# Patient Record
Sex: Female | Born: 1990 | Race: Black or African American | Hispanic: No | Marital: Married | Smoking: Current every day smoker
Health system: Southern US, Community
[De-identification: ages and names within clinical notes are randomized; demographics above are authoritative.]

## PROBLEM LIST (undated history)

## (undated) ENCOUNTER — Inpatient Hospital Stay: Payer: Self-pay

## (undated) DIAGNOSIS — G894 Chronic pain syndrome: Secondary | ICD-10-CM

## (undated) DIAGNOSIS — R51 Headache: Secondary | ICD-10-CM

## (undated) DIAGNOSIS — K219 Gastro-esophageal reflux disease without esophagitis: Secondary | ICD-10-CM

## (undated) DIAGNOSIS — N644 Mastodynia: Secondary | ICD-10-CM

## (undated) DIAGNOSIS — F419 Anxiety disorder, unspecified: Secondary | ICD-10-CM

## (undated) DIAGNOSIS — M19079 Primary osteoarthritis, unspecified ankle and foot: Secondary | ICD-10-CM

## (undated) DIAGNOSIS — F32A Depression, unspecified: Secondary | ICD-10-CM

## (undated) DIAGNOSIS — G4733 Obstructive sleep apnea (adult) (pediatric): Secondary | ICD-10-CM

## (undated) DIAGNOSIS — R519 Headache, unspecified: Secondary | ICD-10-CM

## (undated) DIAGNOSIS — F329 Major depressive disorder, single episode, unspecified: Secondary | ICD-10-CM

## (undated) DIAGNOSIS — N63 Unspecified lump in unspecified breast: Secondary | ICD-10-CM

## (undated) DIAGNOSIS — E669 Obesity, unspecified: Secondary | ICD-10-CM

## (undated) HISTORY — DX: Obesity, unspecified: E66.9

## (undated) HISTORY — DX: Chronic pain syndrome: G89.4

## (undated) HISTORY — DX: Obstructive sleep apnea (adult) (pediatric): G47.33

## (undated) HISTORY — DX: Primary osteoarthritis, unspecified ankle and foot: M19.079

---
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
## 2008-12-14 ENCOUNTER — Emergency Department: Payer: Self-pay | Admitting: Emergency Medicine

## 2009-01-22 ENCOUNTER — Emergency Department: Payer: Self-pay | Admitting: Internal Medicine

## 2009-03-13 ENCOUNTER — Emergency Department: Payer: Self-pay | Admitting: Emergency Medicine

## 2009-08-28 ENCOUNTER — Emergency Department: Payer: Self-pay | Admitting: Emergency Medicine

## 2009-09-26 ENCOUNTER — Emergency Department: Payer: Self-pay | Admitting: Unknown Physician Specialty

## 2009-09-28 ENCOUNTER — Emergency Department: Payer: Self-pay | Admitting: Emergency Medicine

## 2009-12-14 ENCOUNTER — Emergency Department: Payer: Self-pay | Admitting: Emergency Medicine

## 2010-04-05 ENCOUNTER — Encounter: Payer: Self-pay | Admitting: Family Medicine

## 2010-04-21 ENCOUNTER — Observation Stay: Payer: Self-pay

## 2010-04-25 ENCOUNTER — Encounter: Payer: Self-pay | Admitting: Family Medicine

## 2010-05-28 ENCOUNTER — Observation Stay: Payer: Self-pay | Admitting: Obstetrics and Gynecology

## 2010-06-19 ENCOUNTER — Encounter: Payer: Self-pay | Admitting: Family Medicine

## 2010-06-24 ENCOUNTER — Encounter: Payer: Self-pay | Admitting: Family Medicine

## 2010-07-05 ENCOUNTER — Observation Stay: Payer: Self-pay | Admitting: Obstetrics and Gynecology

## 2010-07-09 ENCOUNTER — Observation Stay: Payer: Self-pay | Admitting: Obstetrics and Gynecology

## 2010-07-29 ENCOUNTER — Observation Stay: Payer: Self-pay | Admitting: Obstetrics and Gynecology

## 2010-08-05 ENCOUNTER — Observation Stay: Payer: Self-pay | Admitting: Obstetrics and Gynecology

## 2010-08-12 ENCOUNTER — Observation Stay: Payer: Self-pay | Admitting: Obstetrics and Gynecology

## 2010-08-14 ENCOUNTER — Inpatient Hospital Stay: Payer: Self-pay | Admitting: Obstetrics and Gynecology

## 2010-10-24 ENCOUNTER — Emergency Department: Payer: Self-pay | Admitting: Emergency Medicine

## 2010-11-17 ENCOUNTER — Emergency Department: Payer: Self-pay | Admitting: Emergency Medicine

## 2011-03-11 ENCOUNTER — Emergency Department: Payer: Self-pay | Admitting: Emergency Medicine

## 2011-04-03 ENCOUNTER — Emergency Department: Payer: Self-pay | Admitting: Emergency Medicine

## 2011-04-08 ENCOUNTER — Emergency Department: Payer: Self-pay | Admitting: Emergency Medicine

## 2011-05-25 ENCOUNTER — Emergency Department: Payer: Self-pay | Admitting: Emergency Medicine

## 2011-11-05 ENCOUNTER — Emergency Department: Payer: Self-pay | Admitting: Emergency Medicine

## 2011-12-20 ENCOUNTER — Emergency Department: Payer: Self-pay | Admitting: Internal Medicine

## 2012-01-24 ENCOUNTER — Emergency Department: Payer: Self-pay | Admitting: Emergency Medicine

## 2012-01-24 LAB — RAPID INFLUENZA A&B ANTIGENS

## 2012-03-26 ENCOUNTER — Emergency Department: Payer: Self-pay | Admitting: Emergency Medicine

## 2012-04-20 ENCOUNTER — Emergency Department: Payer: Self-pay | Admitting: Emergency Medicine

## 2012-04-28 ENCOUNTER — Emergency Department: Payer: Self-pay | Admitting: Emergency Medicine

## 2012-06-11 ENCOUNTER — Encounter (HOSPITAL_COMMUNITY): Payer: Self-pay | Admitting: Emergency Medicine

## 2012-06-11 ENCOUNTER — Emergency Department (HOSPITAL_COMMUNITY)
Admission: EM | Admit: 2012-06-11 | Discharge: 2012-06-11 | Disposition: A | Discharge status: Home / Self Care | Payer: Self-pay | Attending: Emergency Medicine | Admitting: Emergency Medicine

## 2012-06-11 DIAGNOSIS — K0889 Other specified disorders of teeth and supporting structures: Secondary | ICD-10-CM

## 2012-06-11 DIAGNOSIS — K029 Dental caries, unspecified: Secondary | ICD-10-CM

## 2012-06-11 DIAGNOSIS — F17200 Nicotine dependence, unspecified, uncomplicated: Secondary | ICD-10-CM | POA: Insufficient documentation

## 2012-06-11 DIAGNOSIS — K089 Disorder of teeth and supporting structures, unspecified: Secondary | ICD-10-CM | POA: Insufficient documentation

## 2012-06-11 MED ORDER — HYDROCODONE-ACETAMINOPHEN 5-325 MG PO TABS: 1.0000 | ORAL_TABLET | ORAL | Status: DC | PRN

## 2012-06-11 MED ORDER — CLINDAMYCIN HCL 150 MG PO CAPS: 300.0000 mg | ORAL_CAPSULE | Freq: Four times a day (QID) | ORAL | Status: DC

## 2012-06-11 MED ORDER — HYDROCODONE-ACETAMINOPHEN 5-325 MG PO TABS
1.0000 | ORAL_TABLET | Freq: Once | ORAL | Status: AC
  Administered 2012-06-11: 1 via ORAL
  Filled 2012-06-11: qty 1

## 2012-06-11 NOTE — ED Notes (Signed)
Pt alert and mentating appropriately upon d/c teaching; pt given d./c teaching, prescriptions and follow up care; pt verbalizes understanding of d/c teaching and has no fruther questions upon d/c. NAD noted upon d/c. Pt instructed not to drive and endorses that she will not drive and has ride home.

## 2012-06-11 NOTE — ED Notes (Signed)
Pt reports dental cavities and pain for the last several months to lower molars. States has been  seen at the dental clinic but self pay and unable to afford extraction. States unable to eat foods normally due to pain. Denies fevers.

## 2012-06-11 NOTE — ED Provider Notes (Signed)
Medical screening examination/treatment/procedure(s) were performed by non-physician practitioner and as supervising physician I was immediately available for consultation/collaboration.  Doug Sou, MD 06/11/12 6295

## 2012-06-11 NOTE — ED Provider Notes (Signed)
History     CSN: [id number]  Arrival date & time 06/11/12  1140   First MD Initiated Contact with Patient 06/11/12 1401      Chief Complaint  Patient presents with  . Dental Pain    (Consider location/radiation/quality/duration/timing/severity/associated sxs/prior treatment) HPI Comments: Patient reports she has had pain in her bilateral lower molars x months.  States she has been to a dental clinic where she was put on Keflex and pain medication, but they were unable to pull her teeth for some reason, was told she needed to see another dentist.  States she was told to take the keflex "as needed," and this has helped "a little bit." Ran out of her pain medication 2-3 days ago.  Denies fevers, sore throat, difficulty swallowing or breathing.   The history is provided by the patient.    History reviewed. No pertinent past medical history.  History reviewed. No pertinent past surgical history.  History reviewed. No pertinent family history.  History  Substance Use Topics  . Smoking status: Current Every Day Smoker  . Smokeless tobacco: Not on file  . Alcohol Use: No    OB History   Grav Para Term Preterm Abortions TAB SAB Ect Mult Living                  Review of Systems  Constitutional: Negative for fever and chills.  HENT: Positive for dental problem. Negative for sore throat, facial swelling and trouble swallowing.   Respiratory: Negative for shortness of breath.     Allergies  Haldol and Amoxicillin  Home Medications   Current Outpatient Rx  Name  Route  Sig  Dispense  Refill  . ciprofloxacin (CIPRO) 500 MG tablet   Oral   Take 500 mg by mouth 2 (two) times daily.         Marland Kitchen oxyCODONE-acetaminophen (PERCOCET/ROXICET) 5-325 MG per tablet   Oral   Take 1 tablet by mouth every 4 (four) hours as needed for pain.         . clindamycin (CLEOCIN) 150 MG capsule   Oral   Take 2 capsules (300 mg total) by mouth 4 (four) times daily.   80 capsule   0   .  HYDROcodone-acetaminophen (NORCO/VICODIN) 5-325 MG per tablet   Oral   Take 1 tablet by mouth every 4 (four) hours as needed for pain.   15 tablet   0     BP 107/52  Pulse 56  Temp(Src) 98.7 F (37.1 C) (Oral)  Ht 5\' 1"  (1.549 m)  Wt 165 lb (74.844 kg)  BMI 31.19 kg/m2  SpO2 99%  LMP 05/21/2012  Physical Exam  Nursing note and vitals reviewed. Constitutional: She appears well-developed and well-nourished. No distress.  HENT:  Head: Normocephalic and atraumatic.  Mouth/Throat: Uvula is midline and mucous membranes are normal. Mucous membranes are not dry. Abnormal dentition. Dental caries present. No edematous. No oropharyngeal exudate, posterior oropharyngeal edema, posterior oropharyngeal erythema or tonsillar abscesses.    Neck: Normal range of motion and phonation normal. Neck supple. No tracheal tenderness present. No tracheal deviation present.  Pulmonary/Chest: Effort normal. No stridor.  Lymphadenopathy:    She has cervical adenopathy.  Neurological: She is alert.  Skin: She is not diaphoretic.    ED Course  Procedures (including critical care time)  Labs Reviewed - No data to display No results found.   1. Pain, dental   2. Caries     MDM  Pt with caries, bilateral  lower molars.  Anterior cervical lymphadenopathy R>L. No paratracheal tenderness or voice change.  No fever.  No obvious dental abscess.  Doubt deeper infection such as ludwig's angina.  Discussed proper use of antibiotics.  Given prior infection that is likely partially treated, will give antibiotic for possible abscess.  D/C home with clinda (pen allergic), norco, dental follow up.  Discussed diagnosis, treatment plan, and follow up with patient.  Pt given return precautions.  Pt verbalizes understanding and agrees with plan.         Trixie Dredge, PA-C 06/11/12 1533

## 2012-06-11 NOTE — ED Notes (Signed)
Pt c/o dental pain x past few months; pt denies fevers/chills; pt denies numbness and tingling; pt alert and mentating appropriately; pt states she vomited once this morning

## 2012-06-11 NOTE — ED Notes (Signed)
Trixie Dredge, PA at beside

## 2012-06-15 ENCOUNTER — Telehealth (HOSPITAL_COMMUNITY): Payer: Self-pay | Admitting: *Deleted

## 2012-06-17 ENCOUNTER — Telehealth (HOSPITAL_COMMUNITY): Payer: Self-pay | Admitting: Emergency Medicine

## 2012-06-17 NOTE — ED Notes (Signed)
Call from on call dentist requesting demographics and referral be faxed to their office. 

## 2012-08-13 ENCOUNTER — Emergency Department: Payer: Self-pay | Admitting: Unknown Physician Specialty

## 2012-08-13 LAB — COMPREHENSIVE METABOLIC PANEL
Alkaline Phosphatase: 56 U/L (ref 50–136)
Anion Gap: 7 (ref 7–16)
BUN: 9 mg/dL (ref 7–18)
Chloride: 104 mmol/L (ref 98–107)
Co2: 25 mmol/L (ref 21–32)
EGFR (African American): >60
Glucose: 87 mg/dL (ref 65–99)
Potassium: 3.8 mmol/L (ref 3.5–5.1)
SGOT(AST): 24 U/L (ref 15–37)
Sodium: 136 mmol/L (ref 136–145)
Total Protein: 7.5 g/dL (ref 6.4–8.2)

## 2012-08-13 LAB — URINALYSIS, COMPLETE
RBC,UR: 2 /HPF (ref 0–5)
WBC UR: 6 /HPF (ref 0–5)

## 2012-08-13 LAB — CBC
HGB: 11.8 g/dL — ABNORMAL LOW (ref 12.0–16.0)
RBC: 4.57 10*6/uL (ref 3.80–5.20)

## 2012-08-21 ENCOUNTER — Emergency Department: Payer: Self-pay | Admitting: Emergency Medicine

## 2012-08-21 LAB — URINALYSIS, COMPLETE
Blood: NEGATIVE
Ketone: NEGATIVE
Ph: 6 (ref 4.5–8.0)
Protein: NEGATIVE
Specific Gravity: 1.023 (ref 1.003–1.030)
Squamous Epithelial: 1
WBC UR: 1 /HPF (ref 0–5)

## 2012-08-21 LAB — CBC
HCT: 35.6 % (ref 35.0–47.0)
MCHC: 32.2 g/dL (ref 32.0–36.0)
MCV: 80 fL (ref 80–100)
Platelet: 373 10*3/uL (ref 150–440)
RBC: 4.46 10*6/uL (ref 3.80–5.20)
WBC: 7.4 10*3/uL (ref 3.6–11.0)

## 2012-11-03 ENCOUNTER — Encounter: Payer: Self-pay | Admitting: Obstetrics & Gynecology

## 2012-11-23 ENCOUNTER — Encounter: Payer: Self-pay | Admitting: Obstetrics & Gynecology

## 2013-03-03 ENCOUNTER — Observation Stay: Payer: Self-pay

## 2013-03-12 DIAGNOSIS — Z8659 Personal history of other mental and behavioral disorders: Secondary | ICD-10-CM | POA: Insufficient documentation

## 2013-03-12 DIAGNOSIS — F1911 Other psychoactive substance abuse, in remission: Secondary | ICD-10-CM | POA: Insufficient documentation

## 2013-06-07 ENCOUNTER — Emergency Department: Payer: Self-pay | Admitting: Emergency Medicine

## 2013-06-07 LAB — DRUG SCREEN, URINE
AMPHETAMINES, UR SCREEN: NEGATIVE (ref ?–1000)
BARBITURATES, UR SCREEN: NEGATIVE (ref ?–200)
Benzodiazepine, Ur Scrn: NEGATIVE (ref ?–200)
CANNABINOID 50 NG, UR ~~LOC~~: NEGATIVE (ref ?–50)
COCAINE METABOLITE, UR ~~LOC~~: NEGATIVE (ref ?–300)
MDMA (Ecstasy)Ur Screen: NEGATIVE (ref ?–500)
METHADONE, UR SCREEN: NEGATIVE (ref ?–300)
OPIATE, UR SCREEN: POSITIVE (ref ?–300)
PHENCYCLIDINE (PCP) UR S: NEGATIVE (ref ?–25)
TRICYCLIC, UR SCREEN: NEGATIVE (ref ?–1000)

## 2013-06-07 LAB — CBC WITH DIFFERENTIAL/PLATELET
BASOS ABS: 0 10*3/uL (ref 0.0–0.1)
Basophil %: 0.5 %
EOS PCT: 1.8 %
Eosinophil #: 0.1 10*3/uL (ref 0.0–0.7)
HCT: 37.9 % (ref 35.0–47.0)
HGB: 11.9 g/dL — AB (ref 12.0–16.0)
LYMPHS PCT: 29.2 %
Lymphocyte #: 1.6 10*3/uL (ref 1.0–3.6)
MCH: 24.8 pg — ABNORMAL LOW (ref 26.0–34.0)
MCHC: 31.4 g/dL — ABNORMAL LOW (ref 32.0–36.0)
MCV: 79 fL — ABNORMAL LOW (ref 80–100)
Monocyte #: 0.5 x10 3/mm (ref 0.2–0.9)
Monocyte %: 9 %
NEUTROS PCT: 59.5 %
Neutrophil #: 3.2 10*3/uL (ref 1.4–6.5)
Platelet: 351 10*3/uL (ref 150–440)
RBC: 4.79 10*6/uL (ref 3.80–5.20)
RDW: 18.5 % — AB (ref 11.5–14.5)
WBC: 5.4 10*3/uL (ref 3.6–11.0)

## 2013-06-07 LAB — COMPREHENSIVE METABOLIC PANEL
ALK PHOS: 68 U/L
ALT: 49 U/L (ref 12–78)
ANION GAP: 3 — AB (ref 7–16)
Albumin: 3.9 g/dL (ref 3.4–5.0)
BILIRUBIN TOTAL: 0.3 mg/dL (ref 0.2–1.0)
BUN: 13 mg/dL (ref 7–18)
CALCIUM: 9 mg/dL (ref 8.5–10.1)
Chloride: 105 mmol/L (ref 98–107)
Co2: 29 mmol/L (ref 21–32)
Creatinine: 0.85 mg/dL (ref 0.60–1.30)
EGFR (Non-African Amer.): >60
Glucose: 91 mg/dL (ref 65–99)
OSMOLALITY: 274 (ref 275–301)
POTASSIUM: 3.8 mmol/L (ref 3.5–5.1)
SGOT(AST): 28 U/L (ref 15–37)
Sodium: 137 mmol/L (ref 136–145)
Total Protein: 7.4 g/dL (ref 6.4–8.2)

## 2013-06-07 LAB — URINALYSIS, COMPLETE
BILIRUBIN, UR: NEGATIVE
BLOOD: NEGATIVE
GLUCOSE, UR: NEGATIVE mg/dL (ref 0–75)
KETONE: NEGATIVE
Nitrite: NEGATIVE
Ph: 5 (ref 4.5–8.0)
Protein: 30
RBC,UR: 8 /HPF (ref 0–5)
SPECIFIC GRAVITY: 1.034 (ref 1.003–1.030)
WBC UR: 11 /HPF (ref 0–5)

## 2013-06-07 LAB — GC/CHLAMYDIA PROBE AMP

## 2013-06-07 LAB — WET PREP, GENITAL

## 2013-06-15 DIAGNOSIS — Z3040 Encounter for surveillance of contraceptives, unspecified: Secondary | ICD-10-CM | POA: Insufficient documentation

## 2013-08-24 ENCOUNTER — Emergency Department: Payer: Self-pay | Admitting: Emergency Medicine

## 2013-08-24 LAB — BASIC METABOLIC PANEL
Anion Gap: 8 (ref 7–16)
BUN: 15 mg/dL (ref 7–18)
CALCIUM: 9.3 mg/dL (ref 8.5–10.1)
CO2: 25 mmol/L (ref 21–32)
Chloride: 106 mmol/L (ref 98–107)
Creatinine: 0.9 mg/dL (ref 0.60–1.30)
EGFR (African American): >60
Glucose: 100 mg/dL — ABNORMAL HIGH (ref 65–99)
Osmolality: 278 (ref 275–301)
Potassium: 4 mmol/L (ref 3.5–5.1)
SODIUM: 139 mmol/L (ref 136–145)

## 2013-08-24 LAB — CBC
HCT: 38.4 % (ref 35.0–47.0)
HGB: 12.1 g/dL (ref 12.0–16.0)
MCH: 25.4 pg — AB (ref 26.0–34.0)
MCHC: 31.6 g/dL — ABNORMAL LOW (ref 32.0–36.0)
MCV: 80 fL (ref 80–100)
PLATELETS: 378 10*3/uL (ref 150–440)
RBC: 4.78 10*6/uL (ref 3.80–5.20)
RDW: 15.1 % — AB (ref 11.5–14.5)
WBC: 5.8 10*3/uL (ref 3.6–11.0)

## 2013-08-24 LAB — TROPONIN I

## 2014-08-02 ENCOUNTER — Emergency Department
Admission: EM | Admit: 2014-08-02 | Discharge: 2014-08-02 | Disposition: A | Discharge status: Home / Self Care | Payer: Medicaid Other | Attending: Emergency Medicine | Admitting: Emergency Medicine

## 2014-08-02 ENCOUNTER — Encounter: Payer: Self-pay | Admitting: Emergency Medicine

## 2014-08-02 DIAGNOSIS — Z720 Tobacco use: Secondary | ICD-10-CM | POA: Diagnosis not present

## 2014-08-02 DIAGNOSIS — R2232 Localized swelling, mass and lump, left upper limb: Secondary | ICD-10-CM | POA: Diagnosis present

## 2014-08-02 DIAGNOSIS — L732 Hidradenitis suppurativa: Secondary | ICD-10-CM | POA: Diagnosis not present

## 2014-08-02 MED ORDER — CLINDAMYCIN HCL 300 MG PO CAPS: 300.0000 mg | ORAL_CAPSULE | Freq: Three times a day (TID) | ORAL | Status: DC

## 2014-08-02 MED ORDER — IBUPROFEN 800 MG PO TABS: 800.0000 mg | ORAL_TABLET | Freq: Three times a day (TID) | ORAL | Status: DC | PRN

## 2014-08-02 MED ORDER — CLINDAMYCIN PHOSPHATE 1 % EX SOLN: CUTANEOUS | Status: DC

## 2014-08-02 MED ORDER — HYDROCODONE-ACETAMINOPHEN 5-325 MG PO TABS: 1.0000 | ORAL_TABLET | Freq: Four times a day (QID) | ORAL | Status: DC | PRN

## 2014-08-02 NOTE — Discharge Instructions (Signed)
Hidradenitis Suppurativa, Sweat Gland Abscess Hidradenitis suppurativa is a long lasting (chronic), uncommon disease of the sweat glands. With this, boil-like lumps and scarring develop in the groin, some times under the arms (axillae), and under the breasts. It may also uncommonly occur behind the ears, in the crease of the buttocks, and around the genitals.  CAUSES  The cause is from a blocking of the sweat glands. They then become infected. It may cause drainage and odor. It is not contagious. So it cannot be given to someone else. It most often shows up in puberty (about 10 to 26 years of age). But it may happen much later. It is similar to acne which is a disease of the sweat glands. This condition is slightly more common in African-Americans and women. SYMPTOMS   Hidradenitis usually starts as one or more red, tender, swellings in the groin or under the arms (axilla).  Over a period of hours to days the lesions get larger. They often open to the skin surface, draining clear to yellow-colored fluid.  The infected area heals with scarring. DIAGNOSIS  Your caregiver makes this diagnosis by looking at you. Sometimes cultures (growing germs on plates in the lab) may be taken. This is to see what germ (bacterium) is causing the infection.  TREATMENT   Topical germ killing medicine applied to the skin (antibiotics) are the treatment of choice. Antibiotics taken by mouth (systemic) are sometimes needed when the condition is getting worse or is severe.  Avoid tight-fitting clothing which traps moisture in.  Dirt does not cause hidradenitis and it is not caused by poor hygiene.  Involved areas should be cleaned daily using an antibacterial soap. Some patients find that the liquid form of Lever 2000, applied to the involved areas as a lotion after bathing, can help reduce the odor related to this condition.  Sometimes surgery is needed to drain infected areas or remove scarred tissue. Removal of  large amounts of tissue is used only in severe cases.  Birth control pills may be helpful.  Oral retinoids (vitamin A derivatives) for 6 to 12 months which are effective for acne may also help this condition.  Weight loss will improve but not cure hidradenitis. It is made worse by being overweight. But the condition is not caused by being overweight.  This condition is more common in people who have had acne.  It may become worse under stress. There is no medical cure for hidradenitis. It can be controlled, but not cured. The condition usually continues for years with periods of getting worse and getting better (remission). Document Released: 10/24/2003 Document Revised: 06/03/2011 Document Reviewed: 06/11/2013 [employer] Patient Information 2015 [employer]. This information is not intended to replace advice given to you by your health care provider. Make sure you discuss any questions you have with your health care provider.  

## 2014-08-02 NOTE — ED Provider Notes (Signed)
[facility] Emergency Department Provider Note  ____________________________________________  Time seen: Approximately 1720  I have reviewed the triage vital signs and the nursing notes.   HISTORY  Chief Complaint Abscess    HPI Madison Singleton is a 24 y.o. female complaining of swelling and drainage of her left axilla states that she's had this before is here today for antibiotics and pain management rates pain as about a 6-8 out of 10 and relieved when she is not moving worsening with any touching has any fevers chills or any other complaints at this time   History reviewed. Previous history of hidradenitis  There are no active problems to display for this patient.   History reviewed. No pertinent past surgical history.  Current Outpatient Rx  Name  Route  Sig  Dispense  Refill  . ciprofloxacin (CIPRO) 500 MG tablet   Oral   Take 500 mg by mouth 2 (two) times daily.         . clindamycin (CLEOCIN) 150 MG capsule   Oral   Take 2 capsules (300 mg total) by mouth 4 (four) times daily.   80 capsule   0   . clindamycin (CLEOCIN) 300 MG capsule   Oral   Take 1 capsule (300 mg total) by mouth 3 (three) times daily.   30 capsule   0   . clindamycin (CLEOCIN-T) 1 % external solution      Apply to affected area 2 times daily   60 mL   0   . HYDROcodone-acetaminophen (NORCO) 5-325 MG per tablet   Oral   Take 1 tablet by mouth every 6 (six) hours as needed for moderate pain.   12 tablet   0   . HYDROcodone-acetaminophen (NORCO/VICODIN) 5-325 MG per tablet   Oral   Take 1 tablet by mouth every 4 (four) hours as needed for pain.   15 tablet   0   . ibuprofen (ADVIL,MOTRIN) 800 MG tablet   Oral   Take 1 tablet (800 mg total) by mouth every 8 (eight) hours as needed.   30 tablet   0   . oxyCODONE-acetaminophen (PERCOCET/ROXICET) 5-325 MG per tablet   Oral   Take 1 tablet by mouth every 4 (four) hours as needed for pain.            Allergies Haldol and Amoxicillin  No family history on file.  Social History History  Substance Use Topics  . Smoking status: Current Every Day Smoker  . Smokeless tobacco: Not on file  . Alcohol Use: No    Review of Systems Constitutional: No fever/chills Eyes: No visual changes. ENT: No sore throat. Cardiovascular: Denies chest pain. Respiratory: Denies shortness of breath. Gastrointestinal: No abdominal pain.  No nausea, no vomiting.  No diarrhea.  No constipation. Genitourinary: Negative for dysuria. Musculoskeletal: Negative for back pain. Skin: Negative for rash. Neurological: Negative for headaches, focal weakness or numbness.  6-point ROS otherwise negative.  ____________________________________________   PHYSICAL EXAM:  VITAL SIGNS: ED Triage Vitals  Enc Vitals Group     BP 08/02/14 1706 115/80 mmHg     Pulse Rate 08/02/14 1706 74     Resp 08/02/14 1706 20     Temp 08/02/14 1706 98.2 F (36.8 C)     Temp Source 08/02/14 1706 Oral     SpO2 08/02/14 1706 98 %     Weight 08/02/14 1706 165 lb (74.844 kg)     Height 08/02/14 1706 5\' 1"  (1.549 m)  Head Cir --      Peak Flow --      Pain Score 08/02/14 1708 9     Pain Loc --      Pain Edu? --      Excl. in GC? --     Constitutional: Alert and oriented. Well appearing and in no acute distress. Eyes: Conjunctivae are normal. PERRL. EOMI. Head: Atraumatic. Nose: No congestion/rhinnorhea. Mouth/Throat: Mucous membranes are moist.  Oropharynx non-erythematous.  Cardiovascular: Normal rate, regular rhythm. Grossly normal heart sounds.  Good peripheral circulation. Respiratory: Normal respiratory effort.  No retractions. Lungs CTAB.  Musculoskeletal: No lower extremity tenderness nor edema.  No joint effusions. Neurologic:  Normal speech and language. No gross focal neurologic deficits are appreciated. Speech is normal. No gait instability. Skin:  Patient has swelling to her left axilla she has a  draining lesion approximately 1.5 cm in size that expresses pus with mild palpation no surrounding induration or cellulitis no palpable adenopathy Psychiatric: Mood and affect are normal. Speech and behavior are normal.  ____________________________________________    PROCEDURES  Procedure(s) performed: None  Critical Care performed: No  ____________________________________________   INITIAL IMPRESSION / ASSESSMENT AND PLAN / ED COURSE  Pertinent labs & imaging results that were available during my care of the patient were reviewed by me and considered in my medical decision making (see chart for details).  Initial impression on this patient hidradenitis of the left axilla spontaneously draining we'll start the patient on antibiotics she has had this multiple previous times in the past recommending that since there is no cellulitis and it is spontaneously draining that she continue warm compresses and that she follow up with surgery for definitive removal of the glands ____________________________________________   FINAL CLINICAL IMPRESSION(S) / ED DIAGNOSES  Final diagnoses:  Hidradenitis suppurativa of left axilla     Dragan Tamburrino Rosalyn GessWilliam C Lekeisha Arenas, PA-C 08/02/14 1741  Sharyn CreamerMark Quale, MD 08/06/14 1659

## 2014-08-02 NOTE — H&P (Signed)
L&D Evaluation:  History:  HPI Pt is a 24 yo G6P5005 at 37.[redacted] weeks GA who presents to L&D after being seen in the ED with c/o of back and abdominal pain s/p fall 2 days ago. She reports that she slipped and fell down 10 steps on her back. She reports +FM and contractions that "have been going on", denies vb, lof. Pt was very argumentative with nurse and CNM when asked why she came to the hospital and stated that  "I just want my cervix checked to see if it has changed because I was seen last week at the hospital and I was 3cm. I want to make sure it has not changed. I also want my sandwich tray now."  When pt asked to provide a urine specimen she declined and stated that " I don't have to pee." Her prenatal care is significant for multiple +UDS for cocaine- was in rehab in June 2014, negative UDS in nov. 2014, elevated 1 hr gtt with normal 3 hr gtt. She is AB+, VI, RI, GBS unknown. She reports that she "has not had an appt at the office since she was seen in the hospital and they did her GBS there."   Presents with abdominal pain, back pain   Patient's Medical History depression, anxiety polysubstance abuse   Patient's Surgical History none   Medications Pre Natal Vitamins   Allergies PCN, haldol, amoxicillin   Social History tobacco  EtOH  drugs  cocaine   Family History Non-Contributory   ROS:  ROS All systems were reviewed.  HEENT, CNS, GI, GU, Respiratory, CV, Renal and Musculoskeletal systems were found to be normal.   Exam:  Vital Signs stable   General no apparent distress   Mental Status clear   Abdomen gravid, non-tender   Back no CVAT   Pelvic no external lesions, 3.5/80/-3   Mebranes Intact   FHT normal rate with no decels, 120's, +accels   Ucx regular, every 4 minutes   Skin dry, no lesions, brusing on forehead, scratches on face- denies physical altercation, abuse   Lymph no lymphadenopathy   Impression:  Impression reactive NST, IUP at 37.6, cat 1 FHT    Plan:  Plan EFM/NST, monitor contractions and for cervical change, UA, UDS   Comments When the plan of care was discussed she stated "I'm not having my baby here, I just want to be checked and have my tray, can I eat now?" Pt refuses to give urine specimen. Pt advised to stay for at least an hour to monitor for contractions and cervical change since she is having contractions every 2-4 minutes. Pt continues to state to staff that she is "not having my baby here and will stay for 20 minutes so that you can check on the baby." Pt decides to leave AMA at this point in time.   Follow Up Appointment need to schedule. this week at [facility]   Electronic Signatures: Jannet MantisSubudhi, Ameliah Baskins (CNM)  (Signed 10-Dec-14 15:06)  Authored: L&D Evaluation   Last Updated: 10-Dec-14 15:06 by Jannet MantisSubudhi, Sargent Mankey (CNM)
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
## 2014-10-04 ENCOUNTER — Emergency Department
Admission: EM | Admit: 2014-10-04 | Discharge: 2014-10-04 | Disposition: A | Discharge status: Home / Self Care | Payer: Medicaid Other | Attending: Student | Admitting: Student

## 2014-10-04 ENCOUNTER — Emergency Department: Payer: Medicaid Other

## 2014-10-04 ENCOUNTER — Encounter: Payer: Self-pay | Admitting: *Deleted

## 2014-10-04 DIAGNOSIS — M25561 Pain in right knee: Secondary | ICD-10-CM | POA: Diagnosis present

## 2014-10-04 DIAGNOSIS — Z720 Tobacco use: Secondary | ICD-10-CM | POA: Diagnosis not present

## 2014-10-04 DIAGNOSIS — Z880 Allergy status to penicillin: Secondary | ICD-10-CM | POA: Diagnosis not present

## 2014-10-04 MED ORDER — IBUPROFEN 800 MG PO TABS: 800.0000 mg | ORAL_TABLET | Freq: Three times a day (TID) | ORAL | Status: DC | PRN

## 2014-10-04 MED ORDER — HYDROCODONE-ACETAMINOPHEN 5-325 MG PO TABS
2.0000 | ORAL_TABLET | Freq: Once | ORAL | Status: AC
Start: 2014-10-04 — End: 2014-10-04
  Administered 2014-10-04: 2 via ORAL
  Filled 2014-10-04: qty 2

## 2014-10-04 MED ORDER — HYDROCODONE-ACETAMINOPHEN 5-325 MG PO TABS: 1.0000 | ORAL_TABLET | ORAL | Status: DC | PRN

## 2014-10-04 MED ORDER — PREDNISONE 10 MG PO TABS: 50.0000 mg | ORAL_TABLET | Freq: Every day | ORAL | Status: DC

## 2014-10-04 MED ORDER — IBUPROFEN 800 MG PO TABS
800.0000 mg | ORAL_TABLET | Freq: Once | ORAL | Status: AC
  Administered 2014-10-04: 800 mg via ORAL
  Filled 2014-10-04: qty 1

## 2014-10-04 NOTE — ED Notes (Signed)
Patient present to ED with right knee pain since yesterday. Patient denies any known injury to knee. Patient reports increase pain with ambulation and movement. No swelling or deformity noted to knee. Denies other complaints at this time. Patient alert and oriented x 4, call bell within reach.

## 2014-10-04 NOTE — ED Provider Notes (Signed)
[facility] Emergency Department Provider Note  ____________________________________________  Time seen: Approximately 9:45 PM  I have reviewed the triage vital signs and the nursing notes.   HISTORY  Chief Complaint Knee Pain    HPI Madison Singleton is a 24 y.o. female presents for evaluation of right knee pain. Denies any trauma states pain started suddenly last night difficult to ambulate.Cries pain as a 10 on a 10 worst pain she ever had.   No past medical history on file.  There are no active problems to display for this patient.   No past surgical history on file.  Current Outpatient Rx  Name  Route  Sig  Dispense  Refill  . ibuprofen (ADVIL,MOTRIN) 800 MG tablet   Oral   Take 1 tablet (800 mg total) by mouth every 8 (eight) hours as needed.   30 tablet   0   . predniSONE (DELTASONE) 10 MG tablet   Oral   Take 5 tablets (50 mg total) by mouth daily with breakfast.   25 tablet   0     Allergies Haldol and Amoxicillin  No family history on file.  Social History History  Substance Use Topics  . Smoking status: Current Every Day Smoker  . Smokeless tobacco: Not on file  . Alcohol Use: No    Review of Systems Constitutional: No fever/chills Eyes: No visual changes. ENT: No sore throat. Cardiovascular: Denies chest pain. Respiratory: Denies shortness of breath. Gastrointestinal: No abdominal pain.  No nausea, no vomiting.  No diarrhea.  No constipation. Genitourinary: Negative for dysuria. Musculoskeletal: Positive for right knee pain. Skin: Negative for rash. Neurological: Negative for headaches, focal weakness or numbness.  10-point ROS otherwise negative.  ____________________________________________   PHYSICAL EXAM:  VITAL SIGNS: ED Triage Vitals  Enc Vitals Group     BP 10/04/14 2123 106/64 mmHg     Pulse Rate 10/04/14 2123 73     Resp 10/04/14 2123 18     Temp 10/04/14 2123 98.9 F (37.2 C)     Temp  Source 10/04/14 2123 Oral     SpO2 10/04/14 2123 97 %     Weight 10/04/14 2123 180 lb (81.647 kg)     Height 10/04/14 2123 5\' 1"  (1.549 m)     Head Cir --      Peak Flow --      Pain Score 10/04/14 2125 10     Pain Loc --      Pain Edu? --      Excl. in GC? --     Constitutional: Alert and oriented. Well appearing and in no acute distress. Eyes: Conjunctivae are normal. PERRL. EOMI. Head: Atraumatic. Nose: No congestion/rhinnorhea. Mouth/Throat: Mucous membranes are moist.  Oropharynx non-erythematous. Neck: No stridor.   Cardiovascular: Normal rate, regular rhythm. Grossly normal heart sounds.  Good peripheral circulation. Respiratory: Normal respiratory effort.  No retractions. Lungs CTAB. Musculoskeletal: Positive right knee pain with limited range of motion. Patient keeps the knee flexed at 45 sitting in wheelchair..  No joint effusions. Neurologic:  Normal speech and language. No gross focal neurologic deficits are appreciated. Speech is normal. No gait instability. Skin:  Skin is warm, dry and intact. No rash noted. Psychiatric: Mood and affect are normal. Speech and behavior are normal.  ____________________________________________   LABS (all labs ordered are listed, but only abnormal results are displayed)  Labs Reviewed - No data to display ____________________________________________   ____________________________________________  RADIOLOGY  Right knee negative interpreted by radiologist  and reviewed by myself. ____________________________________________   PROCEDURES  Procedure(s) performed: None  Critical Care performed: No  ____________________________________________   INITIAL IMPRESSION / ASSESSMENT AND PLAN / ED COURSE  Pertinent labs & imaging results that were available during my care of the patient were reviewed by me and considered in my medical decision making (see chart for details).  Any pain with unknown  etiology. ____________________________________________   FINAL CLINICAL IMPRESSION(S) / ED DIAGNOSES  Final diagnoses:  Knee pain, acute, right      Evangeline Dakin, PA-C 10/04/14 2157  Gayla Doss, MD 10/04/14 2350

## 2014-10-04 NOTE — Discharge Instructions (Signed)
Arthralgia °Your caregiver has diagnosed you as suffering from an arthralgia. Arthralgia means there is pain in a joint. This can come from many reasons including: °· Bruising the joint which causes soreness (inflammation) in the joint. °· Wear and tear on the joints which occur as we grow older (osteoarthritis). °· Overusing the joint. °· Various forms of arthritis. °· Infections of the joint. °Regardless of the cause of pain in your joint, most of these different pains respond to anti-inflammatory drugs and rest. The exception to this is when a joint is infected, and these cases are treated with antibiotics, if it is a bacterial infection. °HOME CARE INSTRUCTIONS  °· Rest the injured area for as long as directed by your caregiver. Then slowly start using the joint as directed by your caregiver and as the pain allows. Crutches as directed may be useful if the ankles, knees or hips are involved. If the knee was splinted or casted, continue use and care as directed. If an stretchy or elastic wrapping bandage has been applied today, it should be removed and re-applied every 3 to 4 hours. It should not be applied tightly, but firmly enough to keep swelling down. Watch toes and feet for swelling, bluish discoloration, coldness, numbness or excessive pain. If any of these problems (symptoms) occur, remove the ace bandage and re-apply more loosely. If these symptoms persist, contact your caregiver or return to this location. °· For the first 24 hours, keep the injured extremity elevated on pillows while lying down. °· Apply ice for 15-20 minutes to the sore joint every couple hours while awake for the first half day. Then 03-04 times per day for the first 48 hours. Put the ice in a plastic bag and place a towel between the bag of ice and your skin. °· Wear any splinting, casting, elastic bandage applications, or slings as instructed. °· Only take over-the-counter or prescription medicines for pain, discomfort, or fever as  directed by your caregiver. Do not use aspirin immediately after the injury unless instructed by your physician. Aspirin can cause increased bleeding and bruising of the tissues. °· If you were given crutches, continue to use them as instructed and do not resume weight bearing on the sore joint until instructed. °Persistent pain and inability to use the sore joint as directed for more than 2 to 3 days are warning signs indicating that you should see a caregiver for a follow-up visit as soon as possible. Initially, a hairline fracture (break in bone) may not be evident on X-rays. Persistent pain and swelling indicate that further evaluation, non-weight bearing or use of the joint (use of crutches or slings as instructed), or further X-rays are indicated. X-rays may sometimes not show a small fracture until a week or 10 days later. Make a follow-up appointment with your own caregiver or one to whom we have referred you. A radiologist (specialist in reading X-rays) may read your X-rays. Make sure you know how you are to obtain your X-ray results. Do not assume everything is normal if you do not hear from us. °SEEK MEDICAL CARE IF: °Bruising, swelling, or pain increases. °SEEK IMMEDIATE MEDICAL CARE IF:  °· Your fingers or toes are numb or blue. °· The pain is not responding to medications and continues to stay the same or get worse. °· The pain in your joint becomes severe. °· You develop a fever over 102° F (38.9° C). °· It becomes impossible to move or use the joint. °MAKE SURE YOU:  °·   Understand these instructions. °· Will watch your condition. °· Will get help right away if you are not doing well or get worse. °Document Released: 03/11/2005 Document Revised: 06/03/2011 Document Reviewed: 10/28/2007 °ExitCare® Patient Information ©2015 [employer]. This information is not intended to replace advice given to you by your health care provider. Make sure you discuss any questions you have with your health care  provider. ° °Arthritis, Nonspecific °Arthritis is inflammation of a joint. This usually means pain, redness, warmth or swelling are present. One or more joints may be involved. There are a number of types of arthritis. Your caregiver may not be able to tell what type of arthritis you have right away. °CAUSES  °The most common cause of arthritis is the wear and tear on the joint (osteoarthritis). This causes damage to the cartilage, which can break down over time. The knees, hips, back and neck are most often affected by this type of arthritis. °Other types of arthritis and common causes of joint pain include: °· Sprains and other injuries near the joint. Sometimes minor sprains and injuries cause pain and swelling that develop hours later. °· Rheumatoid arthritis. This affects hands, feet and knees. It usually affects both sides of your body at the same time. It is often associated with chronic ailments, fever, weight loss and general weakness. °· Crystal arthritis. Gout and pseudo gout can cause occasional acute severe pain, redness and swelling in the foot, ankle, or knee. °· Infectious arthritis. Bacteria can get into a joint through a break in overlying skin. This can cause infection of the joint. Bacteria and viruses can also spread through the blood and affect your joints. °· Drug, infectious and allergy reactions. Sometimes joints can become mildly painful and slightly swollen with these types of illnesses. °SYMPTOMS  °· Pain is the main symptom. °· Your joint or joints can also be red, swollen and warm or hot to the touch. °· You may have a fever with certain types of arthritis, or even feel overall ill. °· The joint with arthritis will hurt with movement. Stiffness is present with some types of arthritis. °DIAGNOSIS  °Your caregiver will suspect arthritis based on your description of your symptoms and on your exam. Testing may be needed to find the type of arthritis: °· Blood and sometimes urine  tests. °· X-ray tests and sometimes CT or MRI scans. °· Removal of fluid from the joint (arthrocentesis) is done to check for bacteria, crystals or other causes. Your caregiver (or a specialist) will numb the area over the joint with a local anesthetic, and use a needle to remove joint fluid for examination. This procedure is only minimally uncomfortable. °· Even with these tests, your caregiver may not be able to tell what kind of arthritis you have. Consultation with a specialist (rheumatologist) may be helpful. °TREATMENT  °Your caregiver will discuss with you treatment specific to your type of arthritis. If the specific type cannot be determined, then the following general recommendations may apply. °Treatment of severe joint pain includes: °· Rest. °· Elevation. °· Anti-inflammatory medication (for example, ibuprofen) may be prescribed. Avoiding activities that cause increased pain. °· Only take over-the-counter or prescription medicines for pain and discomfort as recommended by your caregiver. °· Cold packs over an inflamed joint may be used for 10 to 15 minutes every hour. Hot packs sometimes feel better, but do not use overnight. Do not use hot packs if you are diabetic without your caregiver's permission. °· A cortisone shot into arthritic   joints may help reduce pain and swelling. °· Any acute arthritis that gets worse over the next 1 to 2 days needs to be looked at to be sure there is no joint infection. °Long-term arthritis treatment involves modifying activities and lifestyle to reduce joint stress jarring. This can include weight loss. Also, exercise is needed to nourish the joint cartilage and remove waste. This helps keep the muscles around the joint strong. °HOME CARE INSTRUCTIONS  °· Do not take aspirin to relieve pain if gout is suspected. This elevates uric acid levels. °· Only take over-the-counter or prescription medicines for pain, discomfort or fever as directed by your caregiver. °· Rest the  joint as much as possible. °· If your joint is swollen, keep it elevated. °· Use crutches if the painful joint is in your leg. °· Drinking plenty of fluids may help for certain types of arthritis. °· Follow your caregiver's dietary instructions. °· Try low-impact exercise such as: °¨ Swimming. °¨ Water aerobics. °¨ Biking. °¨ Walking. °· Morning stiffness is often relieved by a warm shower. °· Put your joints through regular range-of-motion. °SEEK MEDICAL CARE IF:  °· You do not feel better in 24 hours or are getting worse. °· You have side effects to medications, or are not getting better with treatment. °SEEK IMMEDIATE MEDICAL CARE IF:  °· You have a fever. °· You develop severe joint pain, swelling or redness. °· Many joints are involved and become painful and swollen. °· There is severe back pain and/or leg weakness. °· You have loss of bowel or bladder control. °Document Released: 04/18/2004 Document Revised: 06/03/2011 Document Reviewed: 05/04/2008 °ExitCare® Patient Information ©2015 [employer]. This information is not intended to replace advice given to you by your health care provider. Make sure you discuss any questions you have with your health care provider. ° °

## 2014-10-04 NOTE — ED Notes (Signed)

## 2014-10-04 NOTE — ED Notes (Signed)
Pt to triage via wheelchair.  Pt has right knee pain.  No known injury.  No swelling noted.  Sx began last night.

## 2015-05-01 ENCOUNTER — Emergency Department: Payer: Medicaid Other

## 2015-05-01 ENCOUNTER — Emergency Department
Admission: EM | Admit: 2015-05-01 | Discharge: 2015-05-01 | Disposition: A | Discharge status: Home / Self Care | Payer: Medicaid Other | Attending: Student | Admitting: Student

## 2015-05-01 ENCOUNTER — Encounter: Payer: Self-pay | Admitting: Medical Oncology

## 2015-05-01 DIAGNOSIS — S6992XA Unspecified injury of left wrist, hand and finger(s), initial encounter: Secondary | ICD-10-CM | POA: Diagnosis present

## 2015-05-01 DIAGNOSIS — Y9389 Activity, other specified: Secondary | ICD-10-CM | POA: Diagnosis not present

## 2015-05-01 DIAGNOSIS — S60222A Contusion of left hand, initial encounter: Secondary | ICD-10-CM | POA: Diagnosis not present

## 2015-05-01 DIAGNOSIS — F17200 Nicotine dependence, unspecified, uncomplicated: Secondary | ICD-10-CM | POA: Insufficient documentation

## 2015-05-01 DIAGNOSIS — Y998 Other external cause status: Secondary | ICD-10-CM | POA: Diagnosis not present

## 2015-05-01 DIAGNOSIS — Z7952 Long term (current) use of systemic steroids: Secondary | ICD-10-CM | POA: Diagnosis not present

## 2015-05-01 DIAGNOSIS — Z880 Allergy status to penicillin: Secondary | ICD-10-CM | POA: Insufficient documentation

## 2015-05-01 DIAGNOSIS — Y9289 Other specified places as the place of occurrence of the external cause: Secondary | ICD-10-CM | POA: Diagnosis not present

## 2015-05-01 MED ORDER — IBUPROFEN 800 MG PO TABS: 800.0000 mg | ORAL_TABLET | Freq: Three times a day (TID) | ORAL | Status: DC | PRN

## 2015-05-01 MED ORDER — IBUPROFEN 800 MG PO TABS
800.0000 mg | ORAL_TABLET | Freq: Once | ORAL | Status: AC
  Administered 2015-05-01: 800 mg via ORAL
  Filled 2015-05-01: qty 1

## 2015-05-01 MED ORDER — OXYCODONE-ACETAMINOPHEN 5-325 MG PO TABS
1.0000 | ORAL_TABLET | Freq: Once | ORAL | Status: AC
  Administered 2015-05-01: 1 via ORAL
  Filled 2015-05-01: qty 1

## 2015-05-01 MED ORDER — OXYCODONE-ACETAMINOPHEN 5-325 MG PO TABS: 1.0000 | ORAL_TABLET | Freq: Four times a day (QID) | ORAL | Status: DC | PRN

## 2015-05-01 MED ORDER — IBUPROFEN 100 MG/5ML PO SUSP: 800.0000 mg | Freq: Once | ORAL | Status: DC
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
## 2015-05-01 NOTE — Discharge Instructions (Signed)
Hand Contusion ° A hand contusion is a deep bruise to the hand. Contusions happen when an injury causes bleeding under the skin. Signs of bruising include pain, puffiness (swelling), and discolored skin. The contusion may turn blue, purple, or yellow. °HOME CARE °· Put ice on the injured area. °¨ Put ice in a plastic bag. °¨ Place a towel between your skin and the bag. °¨ Leave the ice on for 15-20 minutes, 03-04 times a day. °· Only take medicines as told by your doctor. °· Use an elastic wrap only as told. You may remove the wrap for sleeping, showering, and bathing. Take the wrap off if you lose feeling (have numbness) in your fingers, or they turn blue or cold. Put the wrap on more loosely. °· Keep the hand raised (elevated) with pillows. °· Avoid using your hand too much if it is painful. °GET HELP RIGHT AWAY IF:  °· You have more redness, puffiness, or pain in your hand. °· Your puffiness or pain does not get better with medicine. °· You lose feeling in your hand, or you cannot move your fingers. °· Your hand turns cold or blue. °· You have pain when you move your fingers. °· Your hand feels warm. °· Your contusion does not get better in 2 days. °MAKE SURE YOU:  °· Understand these instructions. °· Will watch this condition. °· Will get help right away if you are not doing well or you get worse. °  °This information is not intended to replace advice given to you by your health care provider. Make sure you discuss any questions you have with your health care provider. °  °Document Released: 08/28/2007 Document Revised: 04/01/2014 Document Reviewed: 09/02/2011 °Elsevier Interactive Patient Education ©2016 [employer]. ° °
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
## 2015-07-24 ENCOUNTER — Other Ambulatory Visit: Payer: Self-pay | Admitting: Orthopedic Surgery

## 2015-07-24 DIAGNOSIS — M25561 Pain in right knee: Secondary | ICD-10-CM

## 2015-07-31 ENCOUNTER — Ambulatory Visit
Admission: RE | Admit: 2015-07-31 | Discharge: 2015-07-31 | Disposition: A | Discharge status: Home / Self Care | Payer: Medicaid Other | Source: Ambulatory Visit | Attending: Orthopedic Surgery | Admitting: Orthopedic Surgery

## 2015-07-31 DIAGNOSIS — M25461 Effusion, right knee: Secondary | ICD-10-CM | POA: Diagnosis not present

## 2015-07-31 DIAGNOSIS — M25561 Pain in right knee: Secondary | ICD-10-CM | POA: Diagnosis not present

## 2015-08-31 ENCOUNTER — Encounter: Payer: Self-pay | Admitting: *Deleted

## 2015-08-31 ENCOUNTER — Other Ambulatory Visit: Payer: Medicaid Other

## 2015-08-31 NOTE — Patient Instructions (Signed)
  Your procedure is scheduled on: 09-07-15 Report to [facility] 2ND FLOOR To find out your arrival time please call [phone] between 1PM - 3PM on 09-06-15  Remember: Instructions that are not followed completely may result in serious medical risk, up to and including death, or upon the discretion of your surgeon and anesthesiologist your surgery may need to be rescheduled.    _X___ 1. Do not eat food or drink liquids after midnight. No gum chewing or hard candies.     _X___ 2. No Alcohol for 24 hours before or after surgery.   ____ 3. Bring all medications with you on the day of surgery if instructed.    ____ 4. Notify your doctor if there is any change in your medical condition     (cold, fever, infections).     Do not wear jewelry, make-up, hairpins, clips or nail polish.  Do not wear lotions, powders, or perfumes. You may wear deodorant.  Do not shave 48 hours prior to surgery. Men may shave face and neck.  Do not bring valuables to the hospital.    [facility] is not responsible for any belongings or valuables.               Contacts, dentures or bridgework may not be worn into surgery.  Leave your suitcase in the car. After surgery it may be brought to your room.  For patients admitted to the hospital, discharge time is determined by your treatment team.   Patients discharged the day of surgery will not be allowed to drive home.   Please read over the following fact sheets that you were given:      ____ Take these medicines the morning of surgery with A SIP OF WATER:    1. NONE  2.   3.   4.  5.  6.  ____ Fleet Enema (as directed)   ____ Use CHG Soap as directed  ____ Use inhalers on the day of surgery  ____ Stop metformin 2 days prior to surgery    ____ Take 1/2 of usual insulin dose the night before surgery and none on the morning of surgery.   ____ Stop Coumadin/Plavix/aspirin-N/A  _X___ Stop Anti-inflammatories-NO NSAIDS OR ASA  PRODUCTS-TYLENOL OK TO TAKE   ____ Stop supplements until after surgery.    ____ Bring C-Pap to the hospital.  

## 2015-09-07 ENCOUNTER — Ambulatory Visit: Payer: Medicaid Other | Admitting: Anesthesiology

## 2015-09-07 ENCOUNTER — Encounter
Admission: RE | Disposition: A | Discharge status: Home / Self Care | Payer: Self-pay | Source: Ambulatory Visit | Attending: Orthopedic Surgery

## 2015-09-07 ENCOUNTER — Ambulatory Visit
Admission: RE | Admit: 2015-09-07 | Discharge: 2015-09-07 | Disposition: A | Discharge status: Home / Self Care | Payer: Medicaid Other | Source: Ambulatory Visit | Attending: Orthopedic Surgery | Admitting: Orthopedic Surgery

## 2015-09-07 DIAGNOSIS — Z881 Allergy status to other antibiotic agents status: Secondary | ICD-10-CM | POA: Insufficient documentation

## 2015-09-07 DIAGNOSIS — Z791 Long term (current) use of non-steroidal anti-inflammatories (NSAID): Secondary | ICD-10-CM | POA: Diagnosis not present

## 2015-09-07 DIAGNOSIS — G4700 Insomnia, unspecified: Secondary | ICD-10-CM | POA: Diagnosis not present

## 2015-09-07 DIAGNOSIS — M238X1 Other internal derangements of right knee: Secondary | ICD-10-CM | POA: Diagnosis not present

## 2015-09-07 DIAGNOSIS — M794 Hypertrophy of (infrapatellar) fat pad: Secondary | ICD-10-CM | POA: Insufficient documentation

## 2015-09-07 DIAGNOSIS — Z888 Allergy status to other drugs, medicaments and biological substances status: Secondary | ICD-10-CM | POA: Diagnosis not present

## 2015-09-07 DIAGNOSIS — K219 Gastro-esophageal reflux disease without esophagitis: Secondary | ICD-10-CM | POA: Diagnosis not present

## 2015-09-07 DIAGNOSIS — F17200 Nicotine dependence, unspecified, uncomplicated: Secondary | ICD-10-CM | POA: Diagnosis not present

## 2015-09-07 DIAGNOSIS — F319 Bipolar disorder, unspecified: Secondary | ICD-10-CM | POA: Insufficient documentation

## 2015-09-07 HISTORY — DX: Anxiety disorder, unspecified: F41.9

## 2015-09-07 HISTORY — DX: Depression, unspecified: F32.A

## 2015-09-07 HISTORY — DX: Headache, unspecified: R51.9

## 2015-09-07 HISTORY — PX: KNEE ARTHROSCOPY WITH MEDIAL MENISECTOMY: SHX5651

## 2015-09-07 HISTORY — DX: Gastro-esophageal reflux disease without esophagitis: K21.9

## 2015-09-07 HISTORY — DX: Headache: R51

## 2015-09-07 HISTORY — DX: Major depressive disorder, single episode, unspecified: F32.9

## 2015-09-07 LAB — URINE DRUG SCREEN, QUALITATIVE (ARMC ONLY)
Amphetamines, Ur Screen: NOT DETECTED
BARBITURATES, UR SCREEN: NOT DETECTED
BENZODIAZEPINE, UR SCRN: NOT DETECTED
CANNABINOID 50 NG, UR ~~LOC~~: NOT DETECTED
Cocaine Metabolite,Ur ~~LOC~~: NOT DETECTED
MDMA (Ecstasy)Ur Screen: NOT DETECTED
Methadone Scn, Ur: NOT DETECTED
Opiate, Ur Screen: NOT DETECTED
Phencyclidine (PCP) Ur S: NOT DETECTED
TRICYCLIC, UR SCREEN: NOT DETECTED

## 2015-09-07 LAB — POCT PREGNANCY, URINE: PREG TEST UR: NEGATIVE

## 2015-09-07 SURGERY — ARTHROSCOPY, KNEE, WITH MEDIAL MENISCECTOMY
Anesthesia: General | Laterality: Right | Wound class: Clean

## 2015-09-07 MED ORDER — METOCLOPRAMIDE HCL 5 MG/ML IJ SOLN: 5.0000 mg | Freq: Three times a day (TID) | INTRAMUSCULAR | Status: DC | PRN

## 2015-09-07 MED ORDER — FENTANYL CITRATE (PF) 100 MCG/2ML IJ SOLN
25.0000 ug | INTRAMUSCULAR | Status: DC | PRN
  Administered 2015-09-07 (×4): 25 ug via INTRAVENOUS

## 2015-09-07 MED ORDER — FENTANYL CITRATE (PF) 100 MCG/2ML IJ SOLN
INTRAMUSCULAR | Status: AC
  Administered 2015-09-07: 25 ug via INTRAVENOUS
  Filled 2015-09-07: qty 2

## 2015-09-07 MED ORDER — HYDROCODONE-ACETAMINOPHEN 5-325 MG PO TABS
ORAL_TABLET | ORAL | Status: AC
  Filled 2015-09-07: qty 1

## 2015-09-07 MED ORDER — LIDOCAINE HCL (CARDIAC) 20 MG/ML IV SOLN
INTRAVENOUS | Status: DC | PRN
  Administered 2015-09-07: 80 mg via INTRAVENOUS

## 2015-09-07 MED ORDER — CEFUROXIME OPHTHALMIC INJECTION 1 MG/0.1 ML
INJECTION | OPHTHALMIC | Status: AC
  Filled 2015-09-07: qty 0.1

## 2015-09-07 MED ORDER — LACTATED RINGERS IV SOLN
INTRAVENOUS | Status: DC | PRN
  Administered 2015-09-07: 10:00:00 via INTRAVENOUS

## 2015-09-07 MED ORDER — BUPIVACAINE-EPINEPHRINE (PF) 0.5% -1:200000 IJ SOLN
INTRAMUSCULAR | Status: AC
  Filled 2015-09-07: qty 30

## 2015-09-07 MED ORDER — HYDROCODONE-ACETAMINOPHEN 5-325 MG PO TABS
1.0000 | ORAL_TABLET | Freq: Four times a day (QID) | ORAL | Status: DC | PRN
Start: 2015-09-07 — End: 2018-02-26

## 2015-09-07 MED ORDER — NA HYALUR & NA CHOND-NA HYALUR 0.55-0.5 ML IO KIT
PACK | INTRAOCULAR | Status: AC
  Filled 2015-09-07: qty 1.05

## 2015-09-07 MED ORDER — ONDANSETRON HCL 4 MG PO TABS: 4.0000 mg | ORAL_TABLET | Freq: Four times a day (QID) | ORAL | Status: DC | PRN

## 2015-09-07 MED ORDER — FAMOTIDINE 20 MG PO TABS
ORAL_TABLET | ORAL | Status: AC
  Administered 2015-09-07: 20 mg via ORAL
  Filled 2015-09-07: qty 1

## 2015-09-07 MED ORDER — SODIUM CHLORIDE 0.9 % IV SOLN: INTRAVENOUS | Status: DC

## 2015-09-07 MED ORDER — FENTANYL CITRATE (PF) 100 MCG/2ML IJ SOLN
INTRAMUSCULAR | Status: DC | PRN
  Administered 2015-09-07 (×2): 50 ug via INTRAVENOUS

## 2015-09-07 MED ORDER — BUPIVACAINE-EPINEPHRINE (PF) 0.5% -1:200000 IJ SOLN
INTRAMUSCULAR | Status: DC | PRN
  Administered 2015-09-07: 30 mL via PERINEURAL

## 2015-09-07 MED ORDER — HYDROCODONE-ACETAMINOPHEN 5-325 MG PO TABS
1.0000 | ORAL_TABLET | ORAL | Status: DC | PRN
  Administered 2015-09-07 (×2): 1 via ORAL

## 2015-09-07 MED ORDER — FAMOTIDINE 20 MG PO TABS
20.0000 mg | ORAL_TABLET | Freq: Once | ORAL | Status: AC
  Administered 2015-09-07: 20 mg via ORAL

## 2015-09-07 MED ORDER — PROPOFOL 10 MG/ML IV BOLUS
INTRAVENOUS | Status: DC | PRN
  Administered 2015-09-07: 160 mg via INTRAVENOUS

## 2015-09-07 MED ORDER — MIDAZOLAM HCL 2 MG/2ML IJ SOLN
INTRAMUSCULAR | Status: DC | PRN
  Administered 2015-09-07: 2 mg via INTRAVENOUS

## 2015-09-07 MED ORDER — LACTATED RINGERS IV SOLN
INTRAVENOUS | Status: DC
  Administered 2015-09-07: 09:00:00 via INTRAVENOUS

## 2015-09-07 MED ORDER — ONDANSETRON HCL 4 MG/2ML IJ SOLN
INTRAMUSCULAR | Status: DC | PRN
  Administered 2015-09-07: 4 mg via INTRAVENOUS

## 2015-09-07 MED ORDER — ONDANSETRON HCL 4 MG/2ML IJ SOLN: 4.0000 mg | Freq: Once | INTRAMUSCULAR | Status: DC | PRN

## 2015-09-07 MED ORDER — METOCLOPRAMIDE HCL 10 MG PO TABS: 5.0000 mg | ORAL_TABLET | Freq: Three times a day (TID) | ORAL | Status: DC | PRN

## 2015-09-07 MED ORDER — ONDANSETRON HCL 4 MG/2ML IJ SOLN: 4.0000 mg | Freq: Four times a day (QID) | INTRAMUSCULAR | Status: DC | PRN

## 2015-09-07 SURGICAL SUPPLY — 28 items
BANDAGE ACE 4X5 VEL STRL LF (GAUZE/BANDAGES/DRESSINGS) ×3 IMPLANT
BANDAGE ELASTIC 4 LF NS (GAUZE/BANDAGES/DRESSINGS) ×3 IMPLANT
BLADE FULL RADIUS 3.5 (BLADE) IMPLANT
BLADE INCISOR PLUS 4.5 (BLADE) ×3 IMPLANT
BLADE SHAVER 4.5 DBL SERAT CV (CUTTER) IMPLANT
BLADE SHAVER 4.5X7 STR FR (MISCELLANEOUS) IMPLANT
CHLORAPREP W/TINT 26ML (MISCELLANEOUS) ×3 IMPLANT
CUFF TOURN 24 STER (MISCELLANEOUS) IMPLANT
CUFF TOURN 30 STER DUAL PORT (MISCELLANEOUS) IMPLANT
DRAPE C-ARM XRAY 36X54 (DRAPES) ×3 IMPLANT
DRAPE C-ARMOR (DRAPES) ×3 IMPLANT
GAUZE SPONGE 4X4 12PLY STRL (GAUZE/BANDAGES/DRESSINGS) ×3 IMPLANT
GLOVE SURG ORTHO 9.0 STRL STRW (GLOVE) ×3 IMPLANT
GOWN STRL REUS W/ TWL LRG LVL3 (GOWN DISPOSABLE) ×1 IMPLANT
GOWN STRL REUS W/TWL LRG LVL3 (GOWN DISPOSABLE) ×2
GOWN SURG XXL (GOWNS) ×3 IMPLANT
IV LACTATED RINGER IRRG 3000ML (IV SOLUTION) ×8
IV LR IRRIG 3000ML ARTHROMATIC (IV SOLUTION) ×4 IMPLANT
KIT RM TURNOVER STRD PROC AR (KITS) ×3 IMPLANT
MANIFOLD NEPTUNE II (INSTRUMENTS) ×3 IMPLANT
PACK ARTHROSCOPY KNEE (MISCELLANEOUS) ×3 IMPLANT
SET TUBE SUCT SHAVER OUTFL 24K (TUBING) ×3 IMPLANT
SET TUBE TIP INTRA-ARTICULAR (MISCELLANEOUS) ×3 IMPLANT
SUT ETHILON 4-0 (SUTURE) ×2
SUT ETHILON 4-0 FS2 18XMFL BLK (SUTURE) ×1
SUTURE ETHLN 4-0 FS2 18XMF BLK (SUTURE) ×1 IMPLANT
TUBING ARTHRO INFLOW-ONLY STRL (TUBING) ×3 IMPLANT
WAND HAND CNTRL MULTIVAC 50 (MISCELLANEOUS) ×3 IMPLANT

## 2015-09-07 NOTE — Op Note (Signed)
09/07/2015  10:35 AM  PATIENT:  Madison Singleton  25 y.o. 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 After patient identification and timeout procedures were completed, inferior lateral portal was made and the arthroscope was introduced. Initial inspection revealed sublux patella with a tight lateral retinaculum and a large amount of synovium impinging on the patellofemoral joint and anterior knee the gutters were free of any loose bodies coming around medially and inferior medial portal was made and the medial and lateral compartments were normal in appearance anterior cruciate ligament intact there is no meniscal or articular cartilage pathology in the medial or lateral compartment at this point a shaver is used to debride the fat pad so there is no longer impingement anteriorly and then ArthriCare wand was used to perform a lateral release to get the patella in the midline without excessive pressure laterally after the cement completed the knee was thoroughly irrigated and argentation withdrawn. Wounds are closed simple 4-0 nylon and 30 cc of half percent Sensorcaine with epinephrine were inserted injected into the areas of the portals as well as near the lateral release for postop analgesia. Xeroform 4 x 4 web roll and  Ace wrap applied  PLAN OF CARE: Discharge to home after PACU  PATIENT DISPOSITION:  PACU - hemodynamically stable.

## 2015-09-07 NOTE — Discharge Instructions (Addendum)
Keep dressing clean and dry. Weightbearing as tolerated on leg. Pain medicine as directed. If bandage slides down the leg remove entire bandage and covered to incisions with a Band-Aid over each and reapply Ace wrap only  AMBULATORY SURGERY  DISCHARGE INSTRUCTIONS   1) The drugs that you were given will stay in your system until tomorrow so for the next 24 hours you should not:  A) Drive an automobile B) Make any legal decisions C) Drink any alcoholic beverage   2) You may resume regular meals tomorrow.  Today it is better to start with liquids and gradually work up to solid foods.  You may eat anything you prefer, but it is better to start with liquids, then soup and crackers, and gradually work up to solid foods.   3) Please notify your doctor immediately if you have any unusual bleeding, trouble breathing, redness and pain at the surgery site, drainage, fever, or pain not relieved by medication.    4) Additional Instructions:    Please contact your physician with any problems or Same Day Surgery at [phone], Monday through Friday 6 am to 4 pm, or [place] [facility] Main number at [phone].

## 2015-09-07 NOTE — Anesthesia Preprocedure Evaluation (Signed)
Anesthesia Evaluation  Patient identified by MRN, date of birth, ID band Patient awake    Reviewed: Allergy & Precautions, NPO status , Patient's Chart, lab work & pertinent test results  History of Anesthesia Complications Negative for: history of anesthetic complications  Airway Mallampati: II       Dental   Pulmonary neg pulmonary ROS, Current Smoker,           Cardiovascular negative cardio ROS       Neuro/Psych Anxiety Depression negative neurological ROS     GI/Hepatic Neg liver ROS, GERD  Medicated and Controlled,  Endo/Other  negative endocrine ROS  Renal/GU negative Renal ROS     Musculoskeletal   Abdominal   Peds  Hematology negative hematology ROS (+)   Anesthesia Other Findings   Reproductive/Obstetrics                             Anesthesia Physical Anesthesia Plan  ASA: II  Anesthesia Plan: General   Post-op Pain Management:    Induction: Intravenous  Airway Management Planned: LMA  Additional Equipment:   Intra-op Plan:   Post-operative Plan:   Informed Consent: I have reviewed the patients History and Physical, chart, labs and discussed the procedure including the risks, benefits and alternatives for the proposed anesthesia with the patient or authorized representative who has indicated his/her understanding and acceptance.     Plan Discussed with:   Anesthesia Plan Comments:         Anesthesia Quick Evaluation

## 2015-09-07 NOTE — H&P (Signed)
Reviewed paper H+P, will be scanned into chart. No changes noted.  

## 2015-09-07 NOTE — Transfer of Care (Signed)
Immediate Anesthesia Transfer of Care Note  Patient: Madison Singleton  Procedure(s) Performed: Procedure(s): Arthroscopic Lateral Release  (Right)  Patient Location: PACU  Anesthesia Type:General  Level of Consciousness: awake, alert  and oriented  Airway & Oxygen Therapy: Patient Spontanous Breathing and Patient connected to face mask oxygen  Post-op Assessment: Report given to RN  Post vital signs: Reviewed and stable  Last Vitals:  Filed Vitals:   09/07/15 1041 09/07/15 1042  BP: 140/83 140/83  Pulse: 102 102  Temp: 36.4 C 36.4 C  Resp: 16 17    Last Pain:  Filed Vitals:   09/07/15 1044  PainSc: 7          Complications: No apparent anesthesia complications

## 2015-09-07 NOTE — Anesthesia Procedure Notes (Signed)
Procedure Name: LMA Insertion Date/Time: 09/07/2015 10:02 AM Performed by: [id number], Jorey Dollard Pre-anesthesia Checklist: Timeout performed, Patient being monitored, Suction available, Emergency Drugs available and Patient identified Patient Re-evaluated:Patient Re-evaluated prior to inductionPreoxygenation: Pre-oxygenation with 100% oxygen Intubation Type: IV induction LMA: LMA inserted LMA Size: 4.0 Number of attempts: 1 Placement Confirmation: breath sounds checked- equal and bilateral,  CO2 detector and positive ETCO2 Tube secured with: Tape

## 2015-09-07 NOTE — Anesthesia Postprocedure Evaluation (Signed)
Anesthesia Post Note  Patient: Madison Singleton  Procedure(s) Performed: Procedure(s) (LRB): Arthroscopic Lateral Release  (Right)  Patient location during evaluation: PACU Anesthesia Type: General Level of consciousness: awake and alert Pain management: pain level controlled Vital Signs Assessment: post-procedure vital signs reviewed and stable Respiratory status: spontaneous breathing and respiratory function stable Cardiovascular status: stable Anesthetic complications: no    Last Vitals:  Filed Vitals:   09/07/15 1105 09/07/15 1110  BP:  101/68  Pulse: 58 70  Temp:    Resp: 11 19    Last Pain:  Filed Vitals:   09/07/15 1112  PainSc: 5                  KEPHART,WILLIAM K

## 2015-09-08 ENCOUNTER — Emergency Department
Admission: EM | Admit: 2015-09-08 | Discharge: 2015-09-08 | Disposition: A | Discharge status: Home / Self Care | Payer: Medicaid Other | Attending: Emergency Medicine | Admitting: Emergency Medicine

## 2015-09-08 ENCOUNTER — Encounter: Payer: Self-pay | Admitting: Emergency Medicine

## 2015-09-08 DIAGNOSIS — F329 Major depressive disorder, single episode, unspecified: Secondary | ICD-10-CM | POA: Diagnosis not present

## 2015-09-08 DIAGNOSIS — F1290 Cannabis use, unspecified, uncomplicated: Secondary | ICD-10-CM | POA: Diagnosis not present

## 2015-09-08 DIAGNOSIS — F17210 Nicotine dependence, cigarettes, uncomplicated: Secondary | ICD-10-CM | POA: Insufficient documentation

## 2015-09-08 DIAGNOSIS — Z79899 Other long term (current) drug therapy: Secondary | ICD-10-CM | POA: Diagnosis not present

## 2015-09-08 DIAGNOSIS — M25561 Pain in right knee: Secondary | ICD-10-CM | POA: Diagnosis present

## 2015-09-08 DIAGNOSIS — G8918 Other acute postprocedural pain: Secondary | ICD-10-CM | POA: Diagnosis not present

## 2015-09-08 MED ORDER — OXYCODONE-ACETAMINOPHEN 5-325 MG PO TABS
1.0000 | ORAL_TABLET | Freq: Once | ORAL | Status: AC
  Administered 2015-09-08: 1 via ORAL
  Filled 2015-09-08: qty 1

## 2015-09-08 MED ORDER — KETOROLAC TROMETHAMINE 30 MG/ML IJ SOLN
INTRAMUSCULAR | Status: AC
  Administered 2015-09-08: 30 mg via INTRAVENOUS
  Filled 2015-09-08: qty 1

## 2015-09-08 MED ORDER — MORPHINE SULFATE (PF) 2 MG/ML IV SOLN
INTRAVENOUS | Status: AC
  Administered 2015-09-08: 2 mg via INTRAVENOUS
  Filled 2015-09-08: qty 1

## 2015-09-08 MED ORDER — MORPHINE SULFATE (PF) 2 MG/ML IV SOLN
2.0000 mg | Freq: Once | INTRAVENOUS | Status: AC
  Administered 2015-09-08: 2 mg via INTRAVENOUS

## 2015-09-08 MED ORDER — KETOROLAC TROMETHAMINE 30 MG/ML IJ SOLN
30.0000 mg | Freq: Once | INTRAMUSCULAR | Status: AC
  Administered 2015-09-08: 30 mg via INTRAVENOUS

## 2015-09-08 NOTE — ED Provider Notes (Signed)
[facility] Emergency Department Provider Note  ____________________________________________  Time seen: 2:40 AM  I have reviewed the triage vital signs and the nursing notes.   HISTORY  Chief Complaint Knee Pain    HPI Madison Singleton is a 25 y.o. female presents with 10 out of 10 right knee pain status post right knee surgery performed by Dr. Rosita Kea yesterday. Patient denies any fever afebrile on presentation temperature 98.1. Patient states pain is worse with movement. Patient states pain not improved with hydrocodone at home    Past Medical History  Diagnosis Date  . Headache     HAS RECENTLY STARTED HAVING HA'S MORE FREQUENTLY  . GERD (gastroesophageal reflux disease)     OCC-NO MEDS  . Depression     NO MEDS  . Anxiety     NO MEDS    There are no active problems to display for this patient.   Past Surgical History  Procedure Laterality Date  . No past surgeries    . Knee arthroscopy with medial menisectomy Right 09/07/2015    Procedure: Arthroscopic Lateral Release ;  Surgeon: Kennedy Bucker, MD;  Location: [facility] ORS;  Service: Orthopedics;  Laterality: Right;    Current Outpatient Rx  Name  Route  Sig  Dispense  Refill  . diphenhydrAMINE (BENADRYL) 50 MG tablet   Oral   Take 50 mg by mouth at bedtime.         Marland Kitchen HYDROcodone-acetaminophen (NORCO) 5-325 MG tablet   Oral   Take 1 tablet by mouth every 6 (six) hours as needed for moderate pain.   30 tablet   0     Allergies Haldol and Amoxicillin  History reviewed. No pertinent family history.  Social History Social History  Substance Use Topics  . Smoking status: Current Every Day Smoker -- 1.00 packs/day for 14 years    Types: Cigarettes  . Smokeless tobacco: None  . Alcohol Use: Yes     Comment: SOCIAL    Review of Systems  Constitutional: Negative for fever. Eyes: Negative for visual changes. ENT: Negative for sore throat. Cardiovascular: Negative for chest  pain. Respiratory: Negative for shortness of breath. Gastrointestinal: Negative for abdominal pain, vomiting and diarrhea. Genitourinary: Negative for dysuria. Musculoskeletal: Negative for back pain.Positive for right knee pain Skin: Negative for rash. Neurological: Negative for headaches, focal weakness or numbness.   10-point ROS otherwise negative.  ____________________________________________   PHYSICAL EXAM:  VITAL SIGNS: ED Triage Vitals  Enc Vitals Group     BP 09/08/15 0201 119/54 mmHg     Pulse Rate 09/08/15 0201 99     Resp 09/08/15 0201 18     Temp 09/08/15 0201 98.1 F (36.7 C)     Temp Source 09/08/15 0201 Oral     SpO2 09/08/15 0201 99 %     Weight --      Height --      Head Cir --      Peak Flow --      Pain Score 09/08/15 0156 10     Pain Loc --      Pain Edu? --      Excl. in GC? --      Constitutional: Alert and oriented. Apparent discomfort Eyes: Conjunctivae are normal. PERRL. Normal extraocular movements. ENT   Head: Normocephalic and atraumatic.   Nose: No congestion/rhinnorhea.   Mouth/Throat: Mucous membranes are moist.   Neck: No stridor. Hematological/Lymphatic/Immunilogical: No cervical lymphadenopathy. Cardiovascular: Normal rate, regular rhythm. Normal and symmetric  distal pulses are present in all extremities. No murmurs, rubs, or gallops. Respiratory: Normal respiratory effort without tachypnea nor retractions. Breath sounds are clear and equal bilaterally. No wheezes/rales/rhonchi. Gastrointestinal: Soft and nontender. No distention. There is no CVA tenderness. Genitourinary: deferred Musculoskeletal:Right knee pain with passive range of motion no erythema noted to overlying skin or any other signs of infection  Neurologic:  Normal speech and language. No gross focal neurologic deficits are appreciated. Speech is normal.  Skin:  Skin is warm, dry and intact. No rash noted. Psychiatric: Mood and affect are normal. Speech  and behavior are normal. Patient exhibits appropriate insight and judgment.    INITIAL IMPRESSION / ASSESSMENT AND PLAN / ED COURSE  Pertinent labs & imaging results that were available during my care of the patient were reviewed by me and considered in my medical decision making (see chart for details).  Patient received IV morphine emergency department with resolution of pain. Suspect postoperative pain as etiology for patient's pain. I advised patient to follow up with Dr. Rosita KeaMenz today  ____________________________________________   FINAL CLINICAL IMPRESSION(S) / ED DIAGNOSES  Final diagnoses:  Postoperative pain      Darci Currentandolph N Sunni Richardson, MD 09/08/15 [phone]

## 2015-09-08 NOTE — ED Notes (Signed)
Pt requested pillow, provided. Pt repositioned in bed as she requested. Pt states ice pack makes pain worse, removed.

## 2015-09-08 NOTE — ED Notes (Addendum)
Pt arrived via EMS with c/o uncontrolled post-op at right knee. Pt had surgery to right knee yesterday and was discharged around 1700. Pt states increased around midnight, states took hydrocodone/acetamenophen 5/325mg  without relief. EMS given fentanyl prior to arrival without relief. Pulse and sensation present at distal extremity.

## 2015-09-18 ENCOUNTER — Encounter (HOSPITAL_COMMUNITY): Payer: Self-pay | Admitting: Nurse Practitioner

## 2015-09-18 ENCOUNTER — Emergency Department (HOSPITAL_COMMUNITY)
Admission: EM | Admit: 2015-09-18 | Discharge: 2015-09-18 | Disposition: A | Discharge status: Home / Self Care | Payer: Medicaid Other | Attending: Emergency Medicine | Admitting: Emergency Medicine

## 2015-09-18 DIAGNOSIS — N63 Unspecified lump in unspecified breast: Secondary | ICD-10-CM

## 2015-09-18 DIAGNOSIS — F17210 Nicotine dependence, cigarettes, uncomplicated: Secondary | ICD-10-CM | POA: Insufficient documentation

## 2015-09-18 DIAGNOSIS — Z79899 Other long term (current) drug therapy: Secondary | ICD-10-CM | POA: Insufficient documentation

## 2015-09-18 DIAGNOSIS — N644 Mastodynia: Secondary | ICD-10-CM | POA: Diagnosis present

## 2015-09-18 NOTE — Discharge Instructions (Signed)
Please call the breast center listed first thing in the morning to schedule an appointment for ultrasound. You can also follow up with your OBGYN to get this done as well. Ibuprofen as needed for pain.  Return to ER for new or worsening symptoms, any additional concerns.

## 2015-09-18 NOTE — ED Notes (Signed)
She c/o knot in R breast, she noticed it on Friday. She reports some pain radiating from R breast down R arm into R hand, states her breast and arm feel swollen. She is alert and breathing easily

## 2015-09-18 NOTE — ED Provider Notes (Signed)
CSN: [id number]     Arrival date & time 09/18/15  1451 History  By signing my name below, I, Rosario AdieWilliam Andrew Hiatt, attest that this documentation has been prepared under the direction and in the presence of [employer] IncJaime Demontre Padin, PA-C.   Electronically Signed: Rosario AdieWilliam Andrew Hiatt, ED Scribe. 09/18/2015. 4:53 PM.   Chief Complaint  Patient presents with  . Breast Pain   The history is provided by the patient. No language interpreter was used.   HPI Comments: Madison Singleton is a 25 y.o. female who presents to the Emergency Department complaining of gradual onset, gradually improving, intermittent right-sided breast pain onset 3 days PTA. Pt reports that her pain intermittent radiates down into her right arm. She states that she noticed a knot in her right breast at the onset of her pain. Pt reports that that knot has gotten smaller since she noticed it. She has tried taking 600mg  Ibuprofen with no noted relief of her pain. She reports that her pain is exacerbated to palpation, and is worse at night when she tries to sleep. She denies a hx of similar sx. She states that her cousin previously had breast cancer, but denies having a maternal, paternal, or grandparents with hx of it. Pt denies any known trauma or injury to the area. Pt is currently on Depo-Provera, but notes that she has been spotting recently. Pt denies SOB, chest pain, diaphoresis, n/v, fever, or any other additional symptoms.    Past Medical History  Diagnosis Date  . Headache     HAS RECENTLY STARTED HAVING HA'S MORE FREQUENTLY  . GERD (gastroesophageal reflux disease)     OCC-NO MEDS  . Depression     NO MEDS  . Anxiety     NO MEDS   Past Surgical History  Procedure Laterality Date  . No past surgeries    . Knee arthroscopy with medial menisectomy Right 09/07/2015    Procedure: Arthroscopic Lateral Release ;  Surgeon: Kennedy BuckerMichael Menz, MD;  Location: [facility] ORS;  Service: Orthopedics;  Laterality: Right;   History reviewed. No  pertinent family history. Social History  Substance Use Topics  . Smoking status: Current Every Day Smoker -- 1.00 packs/day for 14 years    Types: Cigarettes  . Smokeless tobacco: None  . Alcohol Use: Yes     Comment: SOCIAL   OB History    No data available     Review of Systems  Constitutional: Negative for fever and diaphoresis.  Respiratory: Negative for shortness of breath.   Cardiovascular: Negative for chest pain.  Gastrointestinal: Negative for nausea and vomiting.   Allergies  Haldol and Amoxicillin  Home Medications   Prior to Admission medications   Medication Sig Start Date End Date Taking? Authorizing Provider  diphenhydrAMINE (BENADRYL) 50 MG tablet Take 50 mg by mouth at bedtime.    Historical Provider, MD  HYDROcodone-acetaminophen (NORCO) 5-325 MG tablet Take 1 tablet by mouth every 6 (six) hours as needed for moderate pain. 09/07/15   Kennedy BuckerMichael Menz, MD   BP 107/75 mmHg  Pulse 88  Temp(Src) 98.4 F (36.9 C) (Oral)  Resp 16  SpO2 99%  LMP 08/31/2015 (Approximate)   Physical Exam  Constitutional: She appears well-developed and well-nourished.  HENT:  Head: Normocephalic.  Eyes: Conjunctivae are normal.  Cardiovascular: Normal rate, regular rhythm and normal heart sounds.   No murmur heard. Pulmonary/Chest: Effort normal and breath sounds normal. No respiratory distress. She exhibits tenderness (generalized).    1cm tender mobile nodule at  3 o'clock of the right breast. No erythema or streaking. Nooverlaying skin changes. No axiliary lymphadenopathy or nodules.   Musculoskeletal: Normal range of motion.  Neurological: She is alert.  Skin: Skin is warm and dry.  Psychiatric: She has a normal mood and affect. Her behavior is normal.  Nursing note and vitals reviewed.   ED Course  Procedures (including critical care time) DIAGNOSTIC STUDIES: Oxygen Saturation is 99% on RA, normal by my interpretation.   COORDINATION OF CARE: 4:53 PM-Discussed  next steps with pt including f/u for US. Pt verbalized understanding and is agreeable with the plan.   MDM   Final diagnoses:  Breast nodule   Madison Singleton presents to ED for nodule of right breast. On exam, patient is very well-appearing, afebrile and hemodynamically stable. Nodule is tender and mobile. Patient reports intermittent pain radiating down right arm. Cardiac etiology considered, however highly unlikely. Patient informed to follow-up with breast center or her OB/GYN in regards to today's visit for ultrasound versus mammogram.   Evaluation does not show pathology that would require ongoing emergent intervention or inpatient treatment. Patient is hemodynamically stable and mentating appropriately. Discussed findings and plan with patien who agrees with treatment plan as dictated.Return precautions discussed and all questions answered.   I personally performed the services described in this documentation, which was scribed in my presence. The recorded information has been reviewed and is accurate.   [facility]Romelle Muldoon Pilcher Gid Schoffstall, PA-C 09/18/15 1707  Melene Planan Floyd, DO 09/18/15 1945

## 2015-09-18 NOTE — ED Notes (Signed)
Declined W/C at D/C and was escorted to lobby by RN. 

## 2015-09-19 ENCOUNTER — Other Ambulatory Visit: Payer: Self-pay

## 2015-09-19 ENCOUNTER — Telehealth (HOSPITAL_BASED_OUTPATIENT_CLINIC_OR_DEPARTMENT_OTHER): Payer: Self-pay | Admitting: Emergency Medicine

## 2015-09-19 DIAGNOSIS — Z1231 Encounter for screening mammogram for malignant neoplasm of breast: Secondary | ICD-10-CM

## 2015-09-27 ENCOUNTER — Other Ambulatory Visit: Payer: Self-pay | Admitting: Nurse Practitioner

## 2015-09-27 DIAGNOSIS — N644 Mastodynia: Secondary | ICD-10-CM

## 2015-10-05 ENCOUNTER — Encounter: Payer: Self-pay | Admitting: Emergency Medicine

## 2015-10-05 ENCOUNTER — Ambulatory Visit
Admission: RE | Admit: 2015-10-05 | Discharge: 2015-10-05 | Disposition: A | Discharge status: Home / Self Care | Payer: Medicaid Other | Source: Ambulatory Visit | Attending: Nurse Practitioner | Admitting: Nurse Practitioner

## 2015-10-05 ENCOUNTER — Emergency Department
Admission: EM | Admit: 2015-10-05 | Discharge: 2015-10-05 | Disposition: A | Discharge status: Home / Self Care | Payer: No Typology Code available for payment source | Attending: Emergency Medicine | Admitting: Emergency Medicine

## 2015-10-05 DIAGNOSIS — Y999 Unspecified external cause status: Secondary | ICD-10-CM | POA: Diagnosis not present

## 2015-10-05 DIAGNOSIS — F1290 Cannabis use, unspecified, uncomplicated: Secondary | ICD-10-CM | POA: Diagnosis not present

## 2015-10-05 DIAGNOSIS — F17210 Nicotine dependence, cigarettes, uncomplicated: Secondary | ICD-10-CM | POA: Diagnosis not present

## 2015-10-05 DIAGNOSIS — S161XXA Strain of muscle, fascia and tendon at neck level, initial encounter: Secondary | ICD-10-CM | POA: Insufficient documentation

## 2015-10-05 DIAGNOSIS — Y92481 Parking lot as the place of occurrence of the external cause: Secondary | ICD-10-CM | POA: Insufficient documentation

## 2015-10-05 DIAGNOSIS — N644 Mastodynia: Secondary | ICD-10-CM | POA: Diagnosis not present

## 2015-10-05 DIAGNOSIS — M542 Cervicalgia: Secondary | ICD-10-CM | POA: Diagnosis present

## 2015-10-05 DIAGNOSIS — F329 Major depressive disorder, single episode, unspecified: Secondary | ICD-10-CM | POA: Diagnosis not present

## 2015-10-05 DIAGNOSIS — Y939 Activity, unspecified: Secondary | ICD-10-CM | POA: Diagnosis not present

## 2015-10-05 HISTORY — DX: Mastodynia: N64.4

## 2015-10-05 HISTORY — DX: Unspecified lump in unspecified breast: N63.0

## 2015-10-05 MED ORDER — CYCLOBENZAPRINE HCL 10 MG PO TABS: 10.0000 mg | ORAL_TABLET | Freq: Three times a day (TID) | ORAL | Status: DC | PRN

## 2015-10-05 MED ORDER — CYCLOBENZAPRINE HCL 10 MG PO TABS
10.0000 mg | ORAL_TABLET | Freq: Once | ORAL | Status: AC
  Administered 2015-10-05: 10 mg via ORAL
  Filled 2015-10-05: qty 1

## 2015-10-05 NOTE — Discharge Instructions (Signed)
Cervical Sprain A cervical sprain is when the tissues (ligaments) that hold the neck bones in place stretch or tear. HOME CARE   Put ice on the injured area.  Put ice in a plastic bag.  Place a towel between your skin and the bag.  Leave the ice on for 15-20 minutes, 3-4 times a day.  You may have been given a collar to wear. This collar keeps your neck from moving while you heal.  Do not take the collar off unless told by your doctor.  If you have long hair, keep it outside of the collar.  Ask your doctor before changing the position of your collar. You may need to change its position over time to make it more comfortable.  If you are allowed to take off the collar for cleaning or bathing, follow your doctor's instructions on how to do it safely.  Keep your collar clean by wiping it with mild soap and water. Dry it completely. If the collar has removable pads, remove them every 1-2 days to hand wash them with soap and water. Allow them to air dry. They should be dry before you wear them in the collar.  Do not drive while wearing the collar.  Only take medicine as told by your doctor.  Keep all doctor visits as told.  Keep all physical therapy visits as told.  Adjust your work station so that you have good posture while you work.  Avoid positions and activities that make your problems worse.  Warm up and stretch before being active. GET HELP IF:  Your pain is not controlled with medicine.  You cannot take less pain medicine over time as planned.  Your activity level does not improve as expected. GET HELP RIGHT AWAY IF:   You are bleeding.  Your stomach is upset.  You have an allergic reaction to your medicine.  You develop new problems that you cannot explain.  You lose feeling (become numb) or you cannot move any part of your body (paralysis).  You have tingling or weakness in any part of your body.  Your symptoms get worse. Symptoms include:  Pain,  soreness, stiffness, puffiness (swelling), or a burning feeling in your neck.  Pain when your neck is touched.  Shoulder or upper back pain.  Limited ability to move your neck.  Headache.  Dizziness.  Your hands or arms feel week, lose feeling, or tingle.  Muscle spasms.  Difficulty swallowing or chewing. MAKE SURE YOU:   Understand these instructions.  Will watch your condition.  Will get help right away if you are not doing well or get worse.   This information is not intended to replace advice given to you by your health care provider. Make sure you discuss any questions you have with your health care provider.   Document Released: 08/28/2007 Document Revised: 11/11/2012 Document Reviewed: 09/16/2012 Else[employer] Interactive Patient Education 2016 [employer]  Cervical Strain and Sprain With Rehab Cervical strain and sprain are injuries that commonly occur with "whiplash" injuries. Whiplash occurs when the neck is forcefully whipped backward or forward, such as during a motor vehicle accident or during contact sports. The muscles, ligaments, tendons, discs, and nerves of the neck are susceptible to injury when this occurs. RISK FACTORS Risk of having a whiplash injury increases if:  Osteoarthritis of the spine.  Situations that make head or neck accidents or trauma more likely.  High-risk sports (football, rugby, wrestling, hockey, auto racing, gymnastics, diving, contact karate, or boxing).  Poor strength and flexibility of the neck.  Previous neck injury.  Poor tackling technique.  Improperly fitted or padded equipment. SYMPTOMS   Pain or stiffness in the front or back of neck or both.  Symptoms may present immediately or up to 24 hours after injury.  Dizziness, headache, nausea, and vomiting.  Muscle spasm with soreness and stiffness in the neck.  Tenderness and swelling at the injury site. PREVENTION  Learn and use proper technique (avoid tackling  with the head, spearing, and head-butting; use proper falling techniques to avoid landing on the head).  Warm up and stretch properly before activity.  Maintain physical fitness:  Strength, flexibility, and endurance.  Cardiovascular fitness.  Wear properly fitted and padded protective equipment, such as padded soft collars, for participation in contact sports. PROGNOSIS  Recovery from cervical strain and sprain injuries is dependent on the extent of the injury. These injuries are usually curable in 1 week to 3 months with appropriate treatment.  RELATED COMPLICATIONS   Temporary numbness and weakness may occur if the nerve roots are damaged, and this may persist until the nerve has completely healed.  Chronic pain due to frequent recurrence of symptoms.  Prolonged healing, especially if activity is resumed too soon (before complete recovery). TREATMENT  Treatment initially involves the use of ice and medication to help reduce pain and inflammation. It is also important to perform strengthening and stretching exercises and modify activities that worsen symptoms so the injury does not get worse. These exercises may be performed at home or with a therapist. For patients who experience severe symptoms, a soft, padded collar may be recommended to be worn around the neck.  Improving your posture may help reduce symptoms. Posture improvement includes pulling your chin and abdomen in while sitting or standing. If you are sitting, sit in a firm chair with your buttocks against the back of the chair. While sleeping, try replacing your pillow with a small towel rolled to 2 inches in diameter, or use a cervical pillow or soft cervical collar. Poor sleeping positions delay healing.  For patients with nerve root damage, which causes numbness or weakness, the use of a cervical traction apparatus may be recommended. Surgery is rarely necessary for these injuries. However, cervical strain and sprains that are  present at birth (congenital) may require surgery. MEDICATION   If pain medication is necessary, nonsteroidal anti-inflammatory medications, such as aspirin and ibuprofen, or other minor pain relievers, such as acetaminophen, are often recommended.  Do not take pain medication for 7 days before surgery.  Prescription pain relievers may be given if deemed necessary by your caregiver. Use only as directed and only as much as you need. HEAT AND COLD:   Cold treatment (icing) relieves pain and reduces inflammation. Cold treatment should be applied for 10 to 15 minutes every 2 to 3 hours for inflammation and pain and immediately after any activity that aggravates your symptoms. Use ice packs or an ice massage.  Heat treatment may be used prior to performing the stretching and strengthening activities prescribed by your caregiver, physical therapist, or athletic trainer. Use a heat pack or a warm soak. SEEK MEDICAL CARE IF:   Symptoms get worse or do not improve in 2 weeks despite treatment.  New, unexplained symptoms develop (drugs used in treatment may produce side effects). EXERCISES RANGE OF MOTION (ROM) AND STRETCHING EXERCISES - Cervical Strain and Sprain These exercises may help you when beginning to rehabilitate your injury. In order to successfully resolve  your symptoms, you must improve your posture. These exercises are designed to help reduce the forward-head and rounded-shoulder posture which contributes to this condition. Your symptoms may resolve with or without further involvement from your physician, physical therapist or athletic trainer. While completing these exercises, remember:  °· Restoring tissue flexibility helps normal motion to return to the joints. This allows healthier, less painful movement and activity. °· An effective stretch should be held for at least 20 seconds, although you may need to begin with shorter hold times for comfort. °· A stretch should never be painful.  You should only feel a gentle lengthening or release in the stretched tissue. °STRETCH- Axial Extensors °· Lie on your back on the floor. You may bend your knees for comfort. Place a rolled-up hand towel or dish towel, about 2 inches in diameter, under the part of your head that makes contact with the floor. °· Gently tuck your chin, as if trying to make a "double chin," until you feel a gentle stretch at the base of your head. °· Hold __________ seconds. °Repeat __________ times. Complete this exercise __________ times per day.  °STRETCH - Axial Extension  °· Stand or sit on a firm surface. Assume a good posture: chest up, shoulders drawn back, abdominal muscles slightly tense, knees unlocked (if standing) and feet hip width apart. °· Slowly retract your chin so your head slides back and your chin slightly lowers. Continue to look straight ahead. °· You should feel a gentle stretch in the back of your head. Be certain not to feel an aggressive stretch since this can cause headaches later. °· Hold for __________ seconds. °Repeat __________ times. Complete this exercise __________ times per day. °STRETCH - Cervical Side Bend  °· Stand or sit on a firm surface. Assume a good posture: chest up, shoulders drawn back, abdominal muscles slightly tense, knees unlocked (if standing) and feet hip width apart. °· Without letting your nose or shoulders move, slowly tip your right / left ear to your shoulder until your feel a gentle stretch in the muscles on the opposite side of your neck. °· Hold __________ seconds. °Repeat __________ times. Complete this exercise __________ times per day. °STRETCH - Cervical Rotators  °· Stand or sit on a firm surface. Assume a good posture: chest up, shoulders drawn back, abdominal muscles slightly tense, knees unlocked (if standing) and feet hip width apart. °· Keeping your eyes level with the ground, slowly turn your head until you feel a gentle stretch along the back and opposite side of  your neck. °· Hold __________ seconds. °Repeat __________ times. Complete this exercise __________ times per day. °RANGE OF MOTION - Neck Circles  °· Stand or sit on a firm surface. Assume a good posture: chest up, shoulders drawn back, abdominal muscles slightly tense, knees unlocked (if standing) and feet hip width apart. °· Gently roll your head down and around from the back of one shoulder to the back of the other. The motion should never be forced or painful. °· Repeat the motion 10-20 times, or until you feel the neck muscles relax and loosen. °Repeat __________ times. Complete the exercise __________ times per day. °STRENGTHENING EXERCISES - Cervical Strain and Sprain °These exercises may help you when beginning to rehabilitate your injury. They may resolve your symptoms with or without further involvement from your physician, physical therapist, or athletic trainer. While completing these exercises, remember:  °· Muscles can gain both the endurance and the strength needed for everyday activities   through controlled exercises.  Complete these exercises as instructed by your physician, physical therapist, or athletic trainer. Progress the resistance and repetitions only as guided.  You may experience muscle soreness or fatigue, but the pain or discomfort you are trying to eliminate should never worsen during these exercises. If this pain does worsen, stop and make certain you are following the directions exactly. If the pain is still present after adjustments, discontinue the exercise until you can discuss the trouble with your clinician. STRENGTH - Cervical Flexors, Isometric  Face a wall, standing about 6 inches away. Place a small pillow, a ball about 6-8 inches in diameter, or a folded towel between your forehead and the wall.  Slightly tuck your chin and gently push your forehead into the soft object. Push only with mild to moderate intensity, building up tension gradually. Keep your jaw and  forehead relaxed.  Hold 10 to 20 seconds. Keep your breathing relaxed.  Release the tension slowly. Relax your neck muscles completely before you start the next repetition. Repeat __________ times. Complete this exercise __________ times per day. STRENGTH- Cervical Lateral Flexors, Isometric   Stand about 6 inches away from a wall. Place a small pillow, a ball about 6-8 inches in diameter, or a folded towel between the side of your head and the wall.  Slightly tuck your chin and gently tilt your head into the soft object. Push only with mild to moderate intensity, building up tension gradually. Keep your jaw and forehead relaxed.  Hold 10 to 20 seconds. Keep your breathing relaxed.  Release the tension slowly. Relax your neck muscles completely before you start the next repetition. Repeat __________ times. Complete this exercise __________ times per day. STRENGTH - Cervical Extensors, Isometric   Stand about 6 inches away from a wall. Place a small pillow, a ball about 6-8 inches in diameter, or a folded towel between the back of your head and the wall.  Slightly tuck your chin and gently tilt your head back into the soft object. Push only with mild to moderate intensity, building up tension gradually. Keep your jaw and forehead relaxed.  Hold 10 to 20 seconds. Keep your breathing relaxed.  Release the tension slowly. Relax your neck muscles completely before you start the next repetition. Repeat __________ times. Complete this exercise __________ times per day. POSTURE AND BODY MECHANICS CONSIDERATIONS - Cervical Strain and Sprain Keeping correct posture when sitting, standing or completing your activities will reduce the stress put on different body tissues, allowing injured tissues a chance to heal and limiting painful experiences. The following are general guidelines for improved posture. Your physician or physical therapist will provide you with any instructions specific to your  needs. While reading these guidelines, remember:  The exercises prescribed by your provider will help you have the flexibility and strength to maintain correct postures.  The correct posture provides the optimal environment for your joints to work. All of your joints have less wear and tear when properly supported by a spine with good posture. This means you will experience a healthier, less painful body.  Correct posture must be practiced with all of your activities, especially prolonged sitting and standing. Correct posture is as important when doing repetitive low-stress activities (typing) as it is when doing a single heavy-load activity (lifting). PROLONGED STANDING WHILE SLIGHTLY LEANING FORWARD When completing a task that requires you to lean forward while standing in one place for a long time, place either foot up on a stationary 2- to  4-inch high object to help maintain the best posture. When both feet are on the ground, the low back tends to lose its slight inward curve. If this curve flattens (or becomes too large), then the back and your other joints will experience too much stress, fatigue more quickly, and can cause pain.  RESTING POSITIONS Consider which positions are most painful for you when choosing a resting position. If you have pain with flexion-based activities (sitting, bending, stooping, squatting), choose a position that allows you to rest in a less flexed posture. You would want to avoid curling into a fetal position on your side. If your pain worsens with extension-based activities (prolonged standing, working overhead), avoid resting in an extended position such as sleeping on your stomach. Most people will find more comfort when they rest with their spine in a more neutral position, neither too rounded nor too arched. Lying on a non-sagging bed on your side with a pillow between your knees, or on your back with a pillow under your knees will often provide some relief. Keep in  mind, being in any one position for a prolonged period of time, no matter how correct your posture, can still lead to stiffness. WALKING Walk with an upright posture. Your ears, shoulders, and hips should all line up. OFFICE WORK When working at a desk, create an environment that supports good, upright posture. Without extra support, muscles fatigue and lead to excessive strain on joints and other tissues. CHAIR:  A chair should be able to slide under your desk when your back makes contact with the back of the chair. This allows you to work closely.  The chair's height should allow your eyes to be level with the upper part of your monitor and your hands to be slightly lower than your elbows.  Body position:  Your feet should make contact with the floor. If this is not possible, use a foot rest.  Keep your ears over your shoulders. This will reduce stress on your neck and low back.   This information is not intended to replace advice given to you by your health care provider. Make sure you discuss any questions you have with your health care provider.   Document Released: 03/11/2005 Document Revised: 04/01/2014 Document Reviewed: 06/23/2008 Else[employer] Interactive Patient Education [employer]
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
## 2015-11-15 DIAGNOSIS — F4312 Post-traumatic stress disorder, chronic: Secondary | ICD-10-CM | POA: Insufficient documentation

## 2016-05-09 ENCOUNTER — Emergency Department: Payer: Medicaid Other

## 2016-05-09 ENCOUNTER — Emergency Department
Admission: EM | Admit: 2016-05-09 | Discharge: 2016-05-09 | Disposition: A | Discharge status: Home / Self Care | Payer: Medicaid Other | Attending: Emergency Medicine | Admitting: Emergency Medicine

## 2016-05-09 ENCOUNTER — Encounter: Payer: Self-pay | Admitting: Emergency Medicine

## 2016-05-09 DIAGNOSIS — Y929 Unspecified place or not applicable: Secondary | ICD-10-CM | POA: Insufficient documentation

## 2016-05-09 DIAGNOSIS — S301XXA Contusion of abdominal wall, initial encounter: Secondary | ICD-10-CM | POA: Insufficient documentation

## 2016-05-09 DIAGNOSIS — Y939 Activity, unspecified: Secondary | ICD-10-CM | POA: Insufficient documentation

## 2016-05-09 DIAGNOSIS — S299XXA Unspecified injury of thorax, initial encounter: Secondary | ICD-10-CM | POA: Diagnosis present

## 2016-05-09 DIAGNOSIS — F17210 Nicotine dependence, cigarettes, uncomplicated: Secondary | ICD-10-CM | POA: Diagnosis not present

## 2016-05-09 DIAGNOSIS — Y999 Unspecified external cause status: Secondary | ICD-10-CM | POA: Insufficient documentation

## 2016-05-09 DIAGNOSIS — S2020XA Contusion of thorax, unspecified, initial encounter: Secondary | ICD-10-CM | POA: Diagnosis not present

## 2016-05-09 MED ORDER — CYCLOBENZAPRINE HCL 10 MG PO TABS
ORAL_TABLET | ORAL | Status: AC
  Administered 2016-05-09: 5 mg via ORAL
  Filled 2016-05-09: qty 1

## 2016-05-09 MED ORDER — OXYCODONE-ACETAMINOPHEN 5-325 MG PO TABS: 1.0000 | ORAL_TABLET | ORAL | 0 refills | Status: DC | PRN

## 2016-05-09 MED ORDER — CYCLOBENZAPRINE HCL 10 MG PO TABS
5.0000 mg | ORAL_TABLET | Freq: Once | ORAL | Status: AC
  Administered 2016-05-09: 5 mg via ORAL

## 2016-05-09 MED ORDER — CYCLOBENZAPRINE HCL 5 MG PO TABS: 5.0000 mg | ORAL_TABLET | Freq: Three times a day (TID) | ORAL | 0 refills | Status: AC | PRN

## 2016-05-09 MED ORDER — OXYCODONE-ACETAMINOPHEN 5-325 MG PO TABS
1.0000 | ORAL_TABLET | Freq: Once | ORAL | Status: AC
  Administered 2016-05-09: 1 via ORAL
  Filled 2016-05-09: qty 1

## 2016-05-09 MED ORDER — OXYCODONE-ACETAMINOPHEN 5-325 MG PO TABS: 1.0000 | ORAL_TABLET | Freq: Once | ORAL | Status: DC

## 2016-05-09 NOTE — ED Notes (Signed)
Pt refuses d/c vs 
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
## 2016-07-11 ENCOUNTER — Encounter: Payer: Self-pay | Admitting: Emergency Medicine

## 2016-07-11 ENCOUNTER — Emergency Department
Admission: EM | Admit: 2016-07-11 | Discharge: 2016-07-11 | Disposition: A | Discharge status: Home / Self Care | Payer: Medicaid Other | Attending: Emergency Medicine | Admitting: Emergency Medicine

## 2016-07-11 DIAGNOSIS — F418 Other specified anxiety disorders: Secondary | ICD-10-CM | POA: Diagnosis not present

## 2016-07-11 DIAGNOSIS — F17210 Nicotine dependence, cigarettes, uncomplicated: Secondary | ICD-10-CM | POA: Insufficient documentation

## 2016-07-11 DIAGNOSIS — Z79899 Other long term (current) drug therapy: Secondary | ICD-10-CM | POA: Diagnosis not present

## 2016-07-11 DIAGNOSIS — F1410 Cocaine abuse, uncomplicated: Secondary | ICD-10-CM

## 2016-07-11 DIAGNOSIS — F329 Major depressive disorder, single episode, unspecified: Secondary | ICD-10-CM

## 2016-07-11 DIAGNOSIS — F411 Generalized anxiety disorder: Secondary | ICD-10-CM

## 2016-07-11 DIAGNOSIS — F419 Anxiety disorder, unspecified: Secondary | ICD-10-CM | POA: Diagnosis present

## 2016-07-11 DIAGNOSIS — F32A Depression, unspecified: Secondary | ICD-10-CM

## 2016-07-11 LAB — URINE DRUG SCREEN, QUALITATIVE (ARMC ONLY)
Amphetamines, Ur Screen: NOT DETECTED
BARBITURATES, UR SCREEN: NOT DETECTED
BENZODIAZEPINE, UR SCRN: NOT DETECTED
Cannabinoid 50 Ng, Ur ~~LOC~~: NOT DETECTED
Cocaine Metabolite,Ur ~~LOC~~: POSITIVE — AB
MDMA (Ecstasy)Ur Screen: NOT DETECTED
Methadone Scn, Ur: NOT DETECTED
OPIATE, UR SCREEN: NOT DETECTED
Phencyclidine (PCP) Ur S: NOT DETECTED
TRICYCLIC, UR SCREEN: NOT DETECTED

## 2016-07-11 LAB — COMPREHENSIVE METABOLIC PANEL
ALK PHOS: 62 U/L (ref 38–126)
ALT: 22 U/L (ref 14–54)
ANION GAP: 5 (ref 5–15)
AST: 21 U/L (ref 15–41)
Albumin: 4 g/dL (ref 3.5–5.0)
BILIRUBIN TOTAL: 0.4 mg/dL (ref 0.3–1.2)
BUN: 14 mg/dL (ref 6–20)
CALCIUM: 9.1 mg/dL (ref 8.9–10.3)
CO2: 28 mmol/L (ref 22–32)
Chloride: 106 mmol/L (ref 101–111)
Creatinine, Ser: 0.87 mg/dL (ref 0.44–1.00)
GFR calc Af Amer: >60 mL/min (ref >60)
GLUCOSE: 101 mg/dL — AB (ref 65–99)
Potassium: 4.1 mmol/L (ref 3.5–5.1)
Sodium: 139 mmol/L (ref 135–145)
TOTAL PROTEIN: 7.2 g/dL (ref 6.5–8.1)

## 2016-07-11 LAB — CBC
HCT: 35.6 % (ref 35.0–47.0)
Hemoglobin: 11.6 g/dL — ABNORMAL LOW (ref 12.0–16.0)
MCH: 25.2 pg — AB (ref 26.0–34.0)
MCHC: 32.6 g/dL (ref 32.0–36.0)
MCV: 77.3 fL — ABNORMAL LOW (ref 80.0–100.0)
PLATELETS: 416 10*3/uL (ref 150–440)
RBC: 4.6 MIL/uL (ref 3.80–5.20)
RDW: 17.4 % — ABNORMAL HIGH (ref 11.5–14.5)
WBC: 5.2 10*3/uL (ref 3.6–11.0)

## 2016-07-11 LAB — PREGNANCY, URINE: Preg Test, Ur: NEGATIVE

## 2016-07-11 LAB — ETHANOL: Alcohol, Ethyl (B): <5 mg/dL (ref <5)

## 2016-07-11 MED ORDER — CITALOPRAM HYDROBROMIDE 20 MG PO TABS: 20.0000 mg | ORAL_TABLET | Freq: Every day | ORAL | 1 refills | Status: DC

## 2016-07-11 MED ORDER — CITALOPRAM HYDROBROMIDE 20 MG PO TABS
20.0000 mg | ORAL_TABLET | Freq: Every day | ORAL | Status: DC
  Administered 2016-07-11: 20 mg via ORAL
  Filled 2016-07-11: qty 1

## 2016-07-11 NOTE — ED Notes (Signed)
Patient in room talking on phone

## 2016-07-11 NOTE — ED Provider Notes (Signed)
[facility] Emergency Department Provider Note  ____________________________________________  Time seen: Approximately 3:22 PM  I have reviewed the triage vital signs and the nursing notes.   HISTORY  Chief Complaint Anxiety   HPI Madison Singleton is a 26 y.o. female with a history of anxiety and depression who presents for evaluation of worsening symptoms. Patient reports that she reports that she has not been taking her Klonopin, Celexa, trazodone for months. Those were prescribed to her by her primary care doctor. Over the course of the last 2 days patient's anxiety and depression have gotten much worse. Patient has been crying nonstop, having difficulty eating and sleeping. She denies suicidal or homicidal ideation. She came in voluntarily and asked her husband to bring her to the emergency room for help. She denies any medical complaints.  Past Medical History:  Diagnosis Date  . Anxiety    NO MEDS  . Breast mass   . Breast tenderness in female    3;00 x3 weeks  . Depression    NO MEDS  . GERD (gastroesophageal reflux disease)    OCC-NO MEDS  . Headache    HAS RECENTLY STARTED HAVING HA'S MORE FREQUENTLY    There are no active problems to display for this patient.   Past Surgical History:  Procedure Laterality Date  . KNEE ARTHROSCOPY WITH MEDIAL MENISECTOMY Right 09/07/2015   Procedure: Arthroscopic Lateral Release ;  Surgeon: Kennedy Bucker, MD;  Location: [facility] ORS;  Service: Orthopedics;  Laterality: Right;  . NO PAST SURGERIES      Prior to Admission medications   Medication Sig Start Date End Date Taking? Authorizing Provider  diphenhydrAMINE (BENADRYL) 50 MG tablet Take 50 mg by mouth at bedtime.   Yes Historical Provider, MD  cyclobenzaprine (FLEXERIL) 10 MG tablet Take 1 tablet (10 mg total) by mouth 3 (three) times daily as needed for muscle spasms. Patient not taking: Reported on 07/11/2016 10/05/15   Evon Slack, PA-C    HYDROcodone-acetaminophen (NORCO) 5-325 MG tablet Take 1 tablet by mouth every 6 (six) hours as needed for moderate pain. Patient not taking: Reported on 07/11/2016 09/07/15   Kennedy Bucker, MD  oxyCODONE-acetaminophen (ROXICET) 5-325 MG tablet Take 1 tablet by mouth every 4 (four) hours as needed for severe pain. Patient not taking: Reported on 07/11/2016 05/09/16   Enid Derry, PA-C    Allergies Haldol [haloperidol lactate] and Amoxicillin  Family History  Problem Relation Age of Onset  . Breast cancer Cousin     Social History Social History  Substance Use Topics  . Smoking status: Current Every Day Smoker    Packs/day: 1.00    Years: 14.00    Types: Cigarettes  . Smokeless tobacco: Never Used  . Alcohol use Yes     Comment: SOCIAL    Review of Systems  Constitutional: Negative for fever. Eyes: Negative for visual changes. ENT: Negative for sore throat. Neck: No neck pain  Cardiovascular: Negative for chest pain. Respiratory: Negative for shortness of breath. Gastrointestinal: Negative for abdominal pain, vomiting or diarrhea. Genitourinary: Negative for dysuria. Musculoskeletal: Negative for back pain. Skin: Negative for rash. Neurological: Negative for headaches, weakness or numbness. Psych: No SI or HI. + depression and anxiety  ____________________________________________   PHYSICAL EXAM:  VITAL SIGNS: ED Triage Vitals  Enc Vitals Group     BP 07/11/16 1131 113/60     Pulse Rate 07/11/16 1131 68     Resp 07/11/16 1131 20     Temp  07/11/16 1131 98.2 F (36.8 C)     Temp Source 07/11/16 1131 Oral     SpO2 07/11/16 1131 99 %     Weight 07/11/16 1132 175 lb (79.4 kg)     Height 07/11/16 1132  (1.549 m)     Head Circumference --      Peak Flow --      Pain Score --      Pain Loc --      Pain Edu? --      Excl. in GC? --     Constitutional: Alert and oriented. Well appearing and in no apparent distress. HEENT:      Head: Normocephalic and  atraumatic.         Eyes: Conjunctivae are normal. Sclera is non-icteric. EOMI. PERRL      Mouth/Throat: Mucous membranes are moist.       Neck: Supple with no signs of meningismus. Cardiovascular: Regular rate and rhythm. No murmurs, gallops, or rubs. 2+ symmetrical distal pulses are present in all extremities. No JVD. Respiratory: Normal respiratory effort. Lungs are clear to auscultation bilaterally. No wheezes, crackles, or rhonchi.  Gastrointestinal: Soft, non tender, and non distended with positive bowel sounds. No rebound or guarding. Genitourinary: No CVA tenderness. Musculoskeletal: Nontender with normal range of motion in all extremities. No edema, cyanosis, or erythema of extremities. Neurologic: Normal speech and language. Face is symmetric. Moving all extremities. No gross focal neurologic deficits are appreciated. Skin: Skin is warm, dry and intact. No rash noted. Psychiatric: Mood and affect are normal. Speech and behavior are normal.  ____________________________________________   LABS (all labs ordered are listed, but only abnormal results are displayed)  Labs Reviewed  COMPREHENSIVE METABOLIC PANEL - Abnormal; Notable for the following:       Result Value   Glucose, Bld 101 (*)    All other components within normal limits  CBC - Abnormal; Notable for the following:    Hemoglobin 11.6 (*)    MCV 77.3 (*)    MCH 25.2 (*)    RDW 17.4 (*)    All other components within normal limits  URINE DRUG SCREEN, QUALITATIVE ([facility] ONLY) - Abnormal; Notable for the following:    Cocaine Metabolite,Ur [place] POSITIVE (*)    All other components within normal limits  ETHANOL  PREGNANCY, URINE   ____________________________________________  EKG  none  ____________________________________________  RADIOLOGY  none  ____________________________________________   PROCEDURES  Procedure(s) performed: None Procedures Critical Care performed:   None ____________________________________________   INITIAL IMPRESSION / ASSESSMENT AND PLAN / ED COURSE  26 y.o. female with a history of anxiety and depression who presents for evaluation of worsening depression and anxiety. Off of psych meds for months. Patient is here voluntarily asking for help. No suicidal or homicidal ideation. No indication for involuntary commitment. Will consult psychiatry. Blood work done for medical clearance which has been positive for cocaine but otherwise no acute findings.     Pertinent labs & imaging results that were available during my care of the patient were reviewed by me and considered in my medical decision making (see chart for details).    ____________________________________________   FINAL CLINICAL IMPRESSION(S) / ED DIAGNOSES  Final diagnoses:  Anxiety  Depression, unspecified depression type      NEW MEDICATIONS STARTED DURING THIS VISIT:  New Prescriptions   No medications on file     Note:  This document was prepared using Dragon voice recognition software and may include unintentional dictation errors.  Nita Sickle, MD 07/11/16 [phone]

## 2016-07-11 NOTE — ED Notes (Signed)
Student with dr clapaac in room with patient

## 2016-07-11 NOTE — ED Triage Notes (Signed)
Pt states she has not been taking her clonipin, celexa, trazodone. She hasn't taken meds in months.

## 2016-07-11 NOTE — Consult Note (Signed)
[place] Psychiatry Consult   Reason for Consult:  Consult for 26 year old woman who comes to the emergency room requesting a refill of her medicine Referring Physician:  Alfred Levins Patient Identification: Madison Singleton MRN:  [id number] Principal Diagnosis: Generalized anxiety disorder Diagnosis:   Patient Active Problem List   Diagnosis Date Noted  . Generalized anxiety disorder [F41.1] 07/11/2016  . Cocaine abuse [F14.10] 07/11/2016    Total Time spent with patient: 1 hour  Subjective:   Madison Singleton is a 26 y.o. female patient admitted with "my nerves".  HPI:  Patient interviewed chart reviewed. 26 year old woman came into the emergency room reporting that her anxiety has been worse. She's been off of her medication for about 3 months. Previously was taking psychiatric medicine prescribed by the [facility] clinic. She failed to have her follow-up with outpatient mental health as recommended. She says she sleeps poorly at night with a lot of early morning awakening. Feels nervous and irritable. No suicidal thoughts but admits that people get on her nerves from time to time and she sometimes thinks about going off and doing something but doesn't have any specific thoughts of violence. Patient denies any hallucinations. Says that her mood feels down and depressed irritable nervous much of the time. She says she's felt this way pretty much her entire life but was a little better when she was taking her medicine. She claims the medicine she was prescribed with Celexa trazodone and clonazepam. Patient says she drinks only occasionally and has not been drinking heavily recently. Admits that she has a history of cocaine use and is a little evasive about how often she uses it. Her Payton Mccallum  Social history: Lives with her husband and one child. 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  Medical history: Patient has a history of gastric reflux symptoms. Has a history of complaining of various pains including  headache and knee pain.  Substance abuse history: Admits that she uses cocaine intermittently but is evasive about the exact details of it. Says drinking has not been a problem for her. Her drug screen is positive for cocaine.  Past Psychiatric History: Patient says she had 1 suicide attempt when she was a adolescent. No history of psychiatric hospitalization. Vague as to what the diagnosis is exactly been. Denies any psychotic symptoms.  Risk to Self: Suicidal Ideation: No Suicidal Intent: No Is patient at risk for suicide?: No Suicidal Plan?: No Access to Means: No What has been your use of drugs/alcohol within the last 12 months?: No How many times?: 1 Other Self Harm Risks: none  Triggers for Past Attempts: Unknown Intentional Self Injurious Behavior: Cutting (as a teenager ) Comment - Self Injurious Behavior: as a teenager  Risk to Others: Homicidal Ideation: No Thoughts of Harm to Others: No Current Homicidal Intent: No Current Homicidal Plan: No Access to Homicidal Means: No Identified Victim: n History of harm to others?: Yes Assessment of Violence: In distant past Violent Behavior Description: Fighting  Does patient have access to weapons?: No Criminal Charges Pending?: No Does patient have a court date: No Prior Inpatient Therapy: Prior Inpatient Therapy: No Prior Therapy Dates: n Prior Therapy Facilty/Provider(s): n Reason for Treatment: n Prior Outpatient Therapy: Prior Outpatient Therapy: No Prior Therapy Dates: n/a Prior Therapy Facilty/Provider(s): n/a Reason for Treatment: n/a Does patient have an ACCT team?: No Does patient have Intensive In-House Services?  : No Does patient have [facility] services? : No Does patient have P4CC services?: No  Past Medical History:  Past Medical History:  Diagnosis Date  . Anxiety    NO MEDS  . Breast mass   . Breast tenderness in female    3;00 x3 weeks  . Depression    NO MEDS  . GERD (gastroesophageal reflux  disease)    OCC-NO MEDS  . Headache    HAS RECENTLY STARTED HAVING HA'S MORE FREQUENTLY    Past Surgical History:  Procedure Laterality Date  . KNEE ARTHROSCOPY WITH MEDIAL MENISECTOMY Right 09/07/2015   Procedure: Arthroscopic Lateral Release ;  Surgeon: Hessie Knows, MD;  Location: [facility] ORS;  Service: Orthopedics;  Laterality: Right;  . NO PAST SURGERIES     Family History:  Family History  Problem Relation Age of Onset  . Breast cancer Cousin    Family Psychiatric  History: No known family history Social History:  History  Alcohol Use  . Yes    Comment: SOCIAL     History  Drug Use  . Types: Cocaine    Social History   Social History  . Marital status: Married    Spouse name: N/A  . Number of children: N/A  . Years of education: N/A   Social History Main Topics  . Smoking status: Current Every Day Smoker    Packs/day: 1.00    Years: 14.00    Types: Cigarettes  . Smokeless tobacco: Never Used  . Alcohol use Yes     Comment: SOCIAL  . Drug use: Yes    Types: Cocaine  . Sexual activity: Yes    Birth control/ protection: None   Other Topics Concern  . None   Social History Narrative  . None   Additional Social History:    Allergies:   Allergies  Allergen Reactions  . Haldol [Haloperidol Lactate]     Throat swelling  . Amoxicillin     rash    Labs:  Results for orders placed or performed during the hospital encounter of 07/11/16 (from the past 48 hour(s))  Comprehensive metabolic panel     Status: Abnormal   Collection Time: 07/11/16 11:41 AM  Result Value Ref Range   Sodium 139 135 - 145 mmol/L   Potassium 4.1 3.5 - 5.1 mmol/L   Chloride 106 101 - 111 mmol/L   CO2 28 22 - 32 mmol/L   Glucose, Bld 101 (H) 65 - 99 mg/dL   BUN 14 6 - 20 mg/dL   Creatinine, Ser 0.87 0.44 - 1.00 mg/dL   Calcium 9.1 8.9 - 10.3 mg/dL   Total Protein 7.2 6.5 - 8.1 g/dL   Albumin 4.0 3.5 - 5.0 g/dL   AST 21 15 - 41 U/L   ALT 22 14 - 54 U/L   Alkaline Phosphatase  62 38 - 126 U/L   Total Bilirubin 0.4 0.3 - 1.2 mg/dL   GFR calc non Af Amer >60 >60 mL/min   GFR calc Af Amer >60 >60 mL/min    Comment: (NOTE) The eGFR has been calculated using the CKD EPI equation. This calculation has not been validated in all clinical situations. eGFR's persistently <60 mL/min signify possible Chronic Kidney Disease.    Anion gap 5 5 - 15  Ethanol     Status: None   Collection Time: 07/11/16 11:41 AM  Result Value Ref Range   Alcohol, Ethyl (B) <5 <5 mg/dL    Comment:        LOWEST DETECTABLE LIMIT FOR SERUM ALCOHOL IS 5 mg/dL FOR MEDICAL PURPOSES ONLY   cbc  Status: Abnormal   Collection Time: 07/11/16 11:41 AM  Result Value Ref Range   WBC 5.2 3.6 - 11.0 K/uL   RBC 4.60 3.80 - 5.20 MIL/uL   Hemoglobin 11.6 (L) 12.0 - 16.0 g/dL   HCT 35.6 35.0 - 47.0 %   MCV 77.3 (L) 80.0 - 100.0 fL   MCH 25.2 (L) 26.0 - 34.0 pg   MCHC 32.6 32.0 - 36.0 g/dL   RDW 17.4 (H) 11.5 - 14.5 %   Platelets 416 150 - 440 K/uL  Urine Drug Screen, Qualitative     Status: Abnormal   Collection Time: 07/11/16 11:41 AM  Result Value Ref Range   Tricyclic, Ur Screen NONE DETECTED NONE DETECTED   Amphetamines, Ur Screen NONE DETECTED NONE DETECTED   MDMA (Ecstasy)Ur Screen NONE DETECTED NONE DETECTED   Cocaine Metabolite,Ur [place] POSITIVE (A) NONE DETECTED   Opiate, Ur Screen NONE DETECTED NONE DETECTED   Phencyclidine (PCP) Ur S NONE DETECTED NONE DETECTED   Cannabinoid 50 Ng, Ur [place] NONE DETECTED NONE DETECTED   Barbiturates, Ur Screen NONE DETECTED NONE DETECTED   Benzodiazepine, Ur Scrn NONE DETECTED NONE DETECTED   Methadone Scn, Ur NONE DETECTED NONE DETECTED    Comment: (NOTE) 086  Tricyclics, urine               Cutoff 1000 ng/mL 200  Amphetamines, urine             Cutoff 1000 ng/mL 300  MDMA (Ecstasy), urine           Cutoff 500 ng/mL 400  Cocaine Metabolite, urine       Cutoff 300 ng/mL 500  Opiate, urine                   Cutoff 300 ng/mL 600  Phencyclidine (PCP),  urine      Cutoff 25 ng/mL 700  Cannabinoid, urine              Cutoff 50 ng/mL 800  Barbiturates, urine             Cutoff 200 ng/mL 900  Benzodiazepine, urine           Cutoff 200 ng/mL 1000 Methadone, urine                Cutoff 300 ng/mL 1100 1200 The urine drug screen provides only a preliminary, unconfirmed 1300 analytical test result and should not be used for non-medical 1400 purposes. Clinical consideration and professional judgment should 1500 be applied to any positive drug screen result due to possible 1600 interfering substances. A more specific alternate chemical method 1700 must be used in order to obtain a confirmed analytical result.  1800 Gas chromato graphy / mass spectrometry (GC/MS) is the preferred 1900 confirmatory method.   Pregnancy, urine     Status: None   Collection Time: 07/11/16 12:32 PM  Result Value Ref Range   Preg Test, Ur NEGATIVE NEGATIVE    Current Facility-Administered Medications  Medication Dose Route Frequency Provider Last Rate Last Dose  . citalopram (CELEXA) tablet 20 mg  20 mg Oral Daily Gonzella Lex, MD       Current Outpatient Prescriptions  Medication Sig Dispense Refill  . diphenhydrAMINE (BENADRYL) 50 MG tablet Take 50 mg by mouth at bedtime.    . citalopram (CELEXA) 20 MG tablet Take 1 tablet (20 mg total) by mouth daily. 30 tablet 1  . cyclobenzaprine (FLEXERIL) 10 MG tablet Take 1 tablet (10 mg total)  by mouth 3 (three) times daily as needed for muscle spasms. (Patient not taking: Reported on 07/11/2016) 30 tablet 0  . HYDROcodone-acetaminophen (NORCO) 5-325 MG tablet Take 1 tablet by mouth every 6 (six) hours as needed for moderate pain. (Patient not taking: Reported on 07/11/2016) 30 tablet 0  . oxyCODONE-acetaminophen (ROXICET) 5-325 MG tablet Take 1 tablet by mouth every 4 (four) hours as needed for severe pain. (Patient not taking: Reported on 07/11/2016) 6 tablet 0    Musculoskeletal: Strength & Muscle Tone: within normal  limits Gait & Station: normal Patient leans: N/A  Psychiatric Specialty Exam: Physical Exam  Nursing note and vitals reviewed. Constitutional: She appears well-developed and well-nourished.  HENT:  Head: Normocephalic and atraumatic.  Eyes: Conjunctivae are normal. Pupils are equal, round, and reactive to light.  Neck: Normal range of motion.  Cardiovascular: Regular rhythm and normal heart sounds.   Respiratory: Effort normal. No respiratory distress.  GI: Soft.  Musculoskeletal: Normal range of motion.  Neurological: She is alert.  Skin: Skin is warm and dry.  Psychiatric: Her mood appears anxious. Her speech is delayed. She is slowed. Thought content is not paranoid. Cognition and memory are normal. She expresses impulsivity. She expresses no homicidal and no suicidal ideation.    Review of Systems  Constitutional: Negative.   HENT: Negative.   Eyes: Negative.   Respiratory: Negative.   Cardiovascular: Negative.   Gastrointestinal: Negative.   Musculoskeletal: Negative.   Skin: Negative.   Neurological: Negative.   Psychiatric/Behavioral: Positive for depression and substance abuse. Negative for hallucinations, memory loss and suicidal ideas. The patient is nervous/anxious and has insomnia.     Blood pressure 113/60, pulse 68, temperature 98.2 F (36.8 C), temperature source Oral, resp. rate 20, height '5\' 1"'  (1.549 m), weight 79.4 kg (175 lb), SpO2 99 %.Body mass index is 33.07 kg/m.  General Appearance: Fairly Groomed  Eye Contact:  Minimal  Speech:  Normal Rate  Volume:  Decreased  Mood:  Dysphoric  Affect:  Constricted  Thought Process:  Goal Directed  Orientation:  Full (Time, Place, and Person)  Thought Content:  Logical  Suicidal Thoughts:  No  Homicidal Thoughts:  No  Memory:  Immediate;   Good Recent;   Fair Remote;   Fair  Judgement:  Fair  Insight:  Fair  Psychomotor Activity:  Decreased  Concentration:  Concentration: Fair  Recall:  [employer] of  Knowledge:  Fair  Language:  Fair  Akathisia:  No  Handed:  Right  AIMS (if indicated):     Assets:  Desire for Improvement Housing Physical Health  ADL's:  Intact  Cognition:  WNL  Sleep:        Treatment Plan Summary: Plan 26 year old woman who is reporting chronic anxiety and dysphoric mood. No psychosis. No suicidal ideation. Patient is primarily interested in getting refills of her medication. I advised her that I would be happy to refill the trazodone and the citalopram but could not refill clonazepam as that is a controlled substance and we cannot do refills of those from the emergency room. Patient has by her report been off of this medicine but for 3 months which means there is no risk of any further withdrawal. Patient doesn't represent any dangerousness to herself or others. She was clearly displeased with this outcome. She did accept a prescription for the Celexa for 20 mg a day with a month and 1 refill and is directed to follow-up with [facility] where she had been previously referred.  Case reviewed with emergency room physician and TTS.  Disposition: Patient does not meet criteria for psychiatric inpatient admission. Supportive therapy provided about ongoing stressors.  Alethia Berthold, MD 07/11/2016 4:43 PM

## 2016-07-11 NOTE — ED Triage Notes (Signed)
Pt from home via pov with increasing anxiety and depression in last 2 days. States she couldn't stop crying yesterday. Pt alert & oriented with NAD noted.
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
## 2016-07-11 NOTE — ED Notes (Signed)
Blanket given to patient.

## 2016-07-11 NOTE — ED Notes (Signed)
Pt dressed out into appropriate behavioral health clothing. Pt belongings consist of a pair of white socks, black and white sandels, a gray and blue dress, a black and gray bra, white hoop earrings and a Samsung cell phone.Marland Kitchen

## 2016-07-11 NOTE — ED Notes (Signed)
Pt given [facility] follow up information. Discussed discharge instructions, prescriptions, and follow-up care with patient. No questions or concerns at this time. Pt stable at discharge.

## 2016-07-11 NOTE — Discharge Instructions (Signed)
You have been seen in the Emergency Department (ED)  today for a psychiatric complaint.  You have been evaluated by psychiatry and we believe you are safe to be discharged from the hospital.   ° °Please return to the Emergency Department (ED)  immediately if you have ANY thoughts of hurting yourself or anyone else, so that we may help you. ° °Please avoid alcohol and drug use. ° °Follow up with your doctor and/or therapist as soon as possible regarding today's ED  visit.  ° °You may call crisis hotline for [place] at [phone]. ° °

## 2016-10-29 ENCOUNTER — Encounter: Payer: Self-pay | Admitting: *Deleted

## 2016-10-29 ENCOUNTER — Emergency Department
Admission: EM | Admit: 2016-10-29 | Discharge: 2016-10-29 | Disposition: A | Discharge status: Home / Self Care | Payer: Medicaid Other | Attending: Emergency Medicine | Admitting: Emergency Medicine

## 2016-10-29 DIAGNOSIS — Y999 Unspecified external cause status: Secondary | ICD-10-CM | POA: Diagnosis not present

## 2016-10-29 DIAGNOSIS — G8929 Other chronic pain: Secondary | ICD-10-CM | POA: Insufficient documentation

## 2016-10-29 DIAGNOSIS — X509XXA Other and unspecified overexertion or strenuous movements or postures, initial encounter: Secondary | ICD-10-CM | POA: Insufficient documentation

## 2016-10-29 DIAGNOSIS — M545 Low back pain, unspecified: Secondary | ICD-10-CM

## 2016-10-29 DIAGNOSIS — F17210 Nicotine dependence, cigarettes, uncomplicated: Secondary | ICD-10-CM | POA: Insufficient documentation

## 2016-10-29 DIAGNOSIS — Z79899 Other long term (current) drug therapy: Secondary | ICD-10-CM | POA: Insufficient documentation

## 2016-10-29 DIAGNOSIS — Y929 Unspecified place or not applicable: Secondary | ICD-10-CM | POA: Diagnosis not present

## 2016-10-29 DIAGNOSIS — S3992XA Unspecified injury of lower back, initial encounter: Secondary | ICD-10-CM | POA: Diagnosis present

## 2016-10-29 DIAGNOSIS — Y939 Activity, unspecified: Secondary | ICD-10-CM | POA: Diagnosis not present

## 2016-10-29 DIAGNOSIS — T148XXA Other injury of unspecified body region, initial encounter: Secondary | ICD-10-CM

## 2016-10-29 MED ORDER — METHOCARBAMOL 750 MG PO TABS: 750.0000 mg | ORAL_TABLET | Freq: Four times a day (QID) | ORAL | 0 refills | Status: DC

## 2016-10-29 MED ORDER — ORPHENADRINE CITRATE 30 MG/ML IJ SOLN
60.0000 mg | Freq: Two times a day (BID) | INTRAMUSCULAR | Status: DC
  Administered 2016-10-29: 60 mg via INTRAMUSCULAR
  Filled 2016-10-29: qty 2

## 2016-10-29 MED ORDER — TRAMADOL HCL 50 MG PO TABS: 50.0000 mg | ORAL_TABLET | Freq: Four times a day (QID) | ORAL | 0 refills | Status: DC | PRN

## 2016-10-29 MED ORDER — KETOROLAC TROMETHAMINE 60 MG/2ML IM SOLN
60.0000 mg | Freq: Once | INTRAMUSCULAR | Status: AC
  Administered 2016-10-29: 60 mg via INTRAMUSCULAR
  Filled 2016-10-29: qty 2

## 2016-10-29 NOTE — ED Triage Notes (Signed)
States hx of lower back pain, states back pain got worse after carrying in groceries, awake and alert in no acute distress

## 2016-10-29 NOTE — ED Provider Notes (Signed)
[facility] Emergency Department Provider Note   ____________________________________________   First MD Initiated Contact with Patient 10/29/16 1447     (approximate)  I have reviewed the triage vital signs and the nursing notes.   HISTORY  Chief Complaint Back Pain    HPI Madison Singleton is a 26 y.o. female patient complaining of acute bilateral back pain. Patient has a history of chronic back pain. Patient states pain worsens or she was trying to put away groceries. Patient denies radicular component to her back pain she denies bladder or bowel dysfunction. Patient has negative x-ray of her lumbar spine last month.Patient is currently being followed by the [facility] in [place], [place].   Past Medical History:  Diagnosis Date  . Anxiety    NO MEDS  . Breast mass   . Breast tenderness in female    3;00 x3 weeks  . Depression    NO MEDS  . GERD (gastroesophageal reflux disease)    OCC-NO MEDS  . Headache    HAS RECENTLY STARTED HAVING HA'S MORE FREQUENTLY    Patient Active Problem List   Diagnosis Date Noted  . Generalized anxiety disorder 07/11/2016  . Cocaine abuse 07/11/2016    Past Surgical History:  Procedure Laterality Date  . KNEE ARTHROSCOPY WITH MEDIAL MENISECTOMY Right 09/07/2015   Procedure: Arthroscopic Lateral Release ;  Surgeon: Kennedy Bucker, MD;  Location: [facility] ORS;  Service: Orthopedics;  Laterality: Right;  . NO PAST SURGERIES      Prior to Admission medications   Medication Sig Start Date End Date Taking? Authorizing Provider  citalopram (CELEXA) 20 MG tablet Take 1 tablet (20 mg total) by mouth daily. 07/11/16   Clapacs, Jackquline Denmark, MD  cyclobenzaprine (FLEXERIL) 10 MG tablet Take 1 tablet (10 mg total) by mouth 3 (three) times daily as needed for muscle spasms. Patient not taking: Reported on 07/11/2016 10/05/15   Evon Slack, PA-C  diphenhydrAMINE (BENADRYL) 50 MG tablet Take 50 mg by mouth at  bedtime.    [provider]  HYDROcodone-acetaminophen (NORCO) 5-325 MG tablet Take 1 tablet by mouth every 6 (six) hours as needed for moderate pain. Patient not taking: Reported on 07/11/2016 09/07/15   Kennedy Bucker, MD  methocarbamol (ROBAXIN-750) 750 MG tablet Take 1 tablet (750 mg total) by mouth 4 (four) times daily. 10/29/16   Joni Reining, PA-C  oxyCODONE-acetaminophen (ROXICET) 5-325 MG tablet Take 1 tablet by mouth every 4 (four) hours as needed for severe pain. Patient not taking: Reported on 07/11/2016 05/09/16   Enid Derry, PA-C  traMADol (ULTRAM) 50 MG tablet Take 1 tablet (50 mg total) by mouth every 6 (six) hours as needed for moderate pain. 10/29/16   Joni Reining, PA-C    Allergies Haldol [haloperidol lactate] and Amoxicillin  Family History  Problem Relation Age of Onset  . Breast cancer Cousin     Social History Social History  Substance Use Topics  . Smoking status: Current Every Day Smoker    Packs/day: 1.00    Years: 14.00    Types: Cigarettes  . Smokeless tobacco: Never Used  . Alcohol use Yes     Comment: SOCIAL    Review of Systems Constitutional: No fever/chills Eyes: No visual changes. ENT: No sore throat. Cardiovascular: Denies chest pain. Respiratory: Denies shortness of breath. Gastrointestinal: No abdominal pain.  No nausea, no vomiting.  No diarrhea.  No constipation. Genitourinary: Negative for dysuria. Musculoskeletal: Chronic back  pain  Skin: Negative for rash. Neurological: Negative for headaches, focal weakness or numbness. Psychiatric:The last anxiety disorder and cocaine abuse Allergic/Immunilogical: See medication list ____________________________________________   PHYSICAL EXAM:  VITAL SIGNS: ED Triage Vitals  Enc Vitals Group     BP 10/29/16 1428 112/79     Pulse Rate 10/29/16 1428 90     Resp 10/29/16 1428 18     Temp 10/29/16 1428 98.7 F (37.1 C)     Temp Source 10/29/16 1428 Oral     SpO2 10/29/16 1428  98 %     Weight 10/29/16 1427 165 lb (74.8 kg)     Height 10/29/16 1427 5\' 1"  (1.549 m)     Head Circumference --      Peak Flow --      Pain Score 10/29/16 1427 10     Pain Loc --      Pain Edu? --      Excl. in GC? --     Constitutional: Alert and oriented. Well appearing and in no acute distress. Eyes: Conjunctivae are normal. PERRL. EOMI. Head: Atraumatic. Nose: No congestion/rhinnorhea. Mouth/Throat: Mucous membranes are moist.  Oropharynx non-erythematous.  Neck: No stridor.   Hematological/Lymphatic/Immunilogical: No cervical lymphadenopathy. Cardiovascular: Normal rate, regular rhythm. Grossly normal heart sounds.  Good peripheral circulation. Respiratory: Normal respiratory effort.  No retractions. Lungs CTAB. Gastrointestinal: Soft and nontender. No distention. No abdominal bruits. No CVA tenderness. Musculoskeletal: Obvious spinal deformity. Patient is guarding with palpation all spinal processes bilateral paraspinal muscle groups. Using distraction patient has bilateral negative straight leg test.  Neurologic:  Normal speech and language. No gross focal neurologic deficits are appreciated. No gait instability. Skin:  Skin is warm, dry and intact. No rash noted. Psychiatric: Mood and affect are normal. Speech and behavior are normal.  ____________________________________________   LABS (all labs ordered are listed, but only abnormal results are displayed)  Labs Reviewed - No data to display ____________________________________________  EKG   ____________________________________________  RADIOLOGY  No results found.  __Reviewed x-ray taken 10/01/2016 revealing no acute findings. __________________________________________   PROCEDURES  Procedure(s) performed: None  Procedures  Critical Care performed: No  ____________________________________________   INITIAL IMPRESSION / ASSESSMENT AND PLAN / ED COURSE  Pertinent labs & imaging results that were  available during my care of the patient were reviewed by me and considered in my medical decision making (see chart for details).  Patient reports is a worsening of chronic low back pain. Patient states she was places groceries on a shelf when she developed pain. Patient is requesting MRI because her x-rays were negative. Discussed rationale for not given MRI today. Patient requests x-ray states she's not leaving until she received imaging. Patient given discharge care instructions and advised to follow-up with her treating spinal clinic. Patient given Toradol and Norflex IM prior to departure. Patient given prescription of tramadol and Robaxin. Patient states she has tramadol at home and on something stronger. Advised patient to follow-up with her treating doctor for stronger pain medication. ____________________________________________   FINAL CLINICAL IMPRESSION(S) / ED DIAGNOSES  Final diagnoses:  Muscle strain  Chronic bilateral low back pain without sciatica      NEW MEDICATIONS STARTED DURING THIS VISIT:  New Prescriptions   METHOCARBAMOL (ROBAXIN-750) 750 MG TABLET    Take 1 tablet (750 mg total) by mouth 4 (four) times daily.   TRAMADOL (ULTRAM) 50 MG TABLET    Take 1 tablet (50 mg total) by mouth every 6 (six) hours as needed for  moderate pain.     Note:  This document was prepared using Dragon voice recognition software and may include unintentional dictation errors.    Joni ReiningSmith, Divine Imber K, PA-C 10/29/16 1525    Emily FilbertWilliams, Jonathan E, MD 10/30/16 [phone]

## 2016-10-29 NOTE — ED Notes (Signed)
Patient presents to the ED with severe back pain that began 2 hours prior to arrival.  Patient reports she was putting away groceries and cooking when pain started.  Patient appears uncomfortable and is having difficulty walking.

## 2016-11-19 ENCOUNTER — Emergency Department: Payer: Medicaid Other

## 2016-11-19 ENCOUNTER — Emergency Department
Admission: EM | Admit: 2016-11-19 | Discharge: 2016-11-19 | Disposition: A | Discharge status: Home / Self Care | Payer: Medicaid Other | Attending: Emergency Medicine | Admitting: Emergency Medicine

## 2016-11-19 DIAGNOSIS — S6992XA Unspecified injury of left wrist, hand and finger(s), initial encounter: Secondary | ICD-10-CM | POA: Diagnosis present

## 2016-11-19 DIAGNOSIS — F17210 Nicotine dependence, cigarettes, uncomplicated: Secondary | ICD-10-CM | POA: Diagnosis not present

## 2016-11-19 DIAGNOSIS — Y999 Unspecified external cause status: Secondary | ICD-10-CM | POA: Diagnosis not present

## 2016-11-19 DIAGNOSIS — S60031A Contusion of right middle finger without damage to nail, initial encounter: Secondary | ICD-10-CM | POA: Diagnosis not present

## 2016-11-19 DIAGNOSIS — Y929 Unspecified place or not applicable: Secondary | ICD-10-CM | POA: Insufficient documentation

## 2016-11-19 DIAGNOSIS — Y939 Activity, unspecified: Secondary | ICD-10-CM | POA: Insufficient documentation

## 2016-11-19 DIAGNOSIS — W51XXXA Accidental striking against or bumped into by another person, initial encounter: Secondary | ICD-10-CM | POA: Diagnosis not present

## 2016-11-19 DIAGNOSIS — S6000XA Contusion of unspecified finger without damage to nail, initial encounter: Secondary | ICD-10-CM

## 2016-11-19 MED ORDER — OXYCODONE-ACETAMINOPHEN 5-325 MG PO TABS: 1.0000 | ORAL_TABLET | ORAL | 0 refills | Status: DC | PRN

## 2016-11-19 MED ORDER — OXYCODONE-ACETAMINOPHEN 5-325 MG PO TABS
2.0000 | ORAL_TABLET | Freq: Once | ORAL | Status: AC
  Administered 2016-11-19: 2 via ORAL
  Filled 2016-11-19: qty 2

## 2016-11-19 NOTE — Discharge Instructions (Signed)
Wear splint. You may change the gauze between the fingers every day. Trying keep that clean and dry. Follow-up with Dr. Rosita Kea the orthopedic surgeon. Call his office in the morning for appointment in about a week.return here for increased pain swelling or redness.
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
## 2016-11-19 NOTE — ED Notes (Signed)
Pt. Verbalizes understanding of d/c instructions, prescriptions, and follow-up. VS stable.Pt. In NAD at time of d/c and denies further concerns regarding this visit. Pt. Stable at the time of departure from the unit, departing unit by the safest and most appropriate manner per that pt condition and limitations. Pt advised to return to the ED at any time for emergent concerns, or for new/worsening symptoms.   

## 2017-01-02 DIAGNOSIS — F3130 Bipolar disorder, current episode depressed, mild or moderate severity, unspecified: Secondary | ICD-10-CM | POA: Insufficient documentation

## 2017-01-22 ENCOUNTER — Ambulatory Visit: Payer: Medicaid Other | Attending: Neurology

## 2017-01-22 DIAGNOSIS — E669 Obesity, unspecified: Secondary | ICD-10-CM | POA: Diagnosis not present

## 2017-01-22 DIAGNOSIS — Z6833 Body mass index (BMI) 33.0-33.9, adult: Secondary | ICD-10-CM | POA: Insufficient documentation

## 2017-01-22 DIAGNOSIS — F329 Major depressive disorder, single episode, unspecified: Secondary | ICD-10-CM | POA: Diagnosis not present

## 2017-01-22 DIAGNOSIS — F5101 Primary insomnia: Secondary | ICD-10-CM | POA: Diagnosis not present

## 2017-01-22 DIAGNOSIS — R0683 Snoring: Secondary | ICD-10-CM | POA: Diagnosis not present

## 2017-01-22 DIAGNOSIS — G4733 Obstructive sleep apnea (adult) (pediatric): Secondary | ICD-10-CM | POA: Diagnosis not present

## 2017-01-22 DIAGNOSIS — F419 Anxiety disorder, unspecified: Secondary | ICD-10-CM | POA: Insufficient documentation

## 2017-03-22 ENCOUNTER — Emergency Department: Payer: Medicaid Other

## 2017-03-22 ENCOUNTER — Encounter: Payer: Self-pay | Admitting: Emergency Medicine

## 2017-03-22 ENCOUNTER — Other Ambulatory Visit: Payer: Self-pay

## 2017-03-22 ENCOUNTER — Emergency Department
Admission: EM | Admit: 2017-03-22 | Discharge: 2017-03-22 | Disposition: A | Discharge status: Home / Self Care | Payer: Medicaid Other | Attending: Student in an Organized Health Care Education/Training Program | Admitting: Student in an Organized Health Care Education/Training Program

## 2017-03-22 DIAGNOSIS — S20211A Contusion of right front wall of thorax, initial encounter: Secondary | ICD-10-CM | POA: Diagnosis not present

## 2017-03-22 DIAGNOSIS — Y929 Unspecified place or not applicable: Secondary | ICD-10-CM | POA: Insufficient documentation

## 2017-03-22 DIAGNOSIS — Z79899 Other long term (current) drug therapy: Secondary | ICD-10-CM | POA: Insufficient documentation

## 2017-03-22 DIAGNOSIS — F17210 Nicotine dependence, cigarettes, uncomplicated: Secondary | ICD-10-CM | POA: Insufficient documentation

## 2017-03-22 DIAGNOSIS — Y939 Activity, unspecified: Secondary | ICD-10-CM | POA: Diagnosis not present

## 2017-03-22 DIAGNOSIS — Y999 Unspecified external cause status: Secondary | ICD-10-CM | POA: Insufficient documentation

## 2017-03-22 DIAGNOSIS — W19XXXA Unspecified fall, initial encounter: Secondary | ICD-10-CM | POA: Insufficient documentation

## 2017-03-22 DIAGNOSIS — S299XXA Unspecified injury of thorax, initial encounter: Secondary | ICD-10-CM | POA: Diagnosis present

## 2017-03-22 MED ORDER — NAPROXEN 500 MG PO TABS
500.0000 mg | ORAL_TABLET | Freq: Once | ORAL | Status: AC
  Administered 2017-03-22: 500 mg via ORAL
  Filled 2017-03-22: qty 1

## 2017-03-22 MED ORDER — TRAMADOL HCL 50 MG PO TABS
50.0000 mg | ORAL_TABLET | Freq: Once | ORAL | Status: AC
  Administered 2017-03-22: 50 mg via ORAL
  Filled 2017-03-22: qty 1

## 2017-03-22 MED ORDER — TRAMADOL HCL 50 MG PO TABS: 50.0000 mg | ORAL_TABLET | Freq: Four times a day (QID) | ORAL | 0 refills | Status: DC | PRN

## 2017-03-22 MED ORDER — NAPROXEN 500 MG PO TABS: 500.0000 mg | ORAL_TABLET | Freq: Two times a day (BID) | ORAL | Status: DC

## 2017-03-22 NOTE — ED Provider Notes (Signed)
[facility] Emergency Department Provider Note   ____________________________________________   First MD Initiated Contact with Patient 03/22/17 1446     (approximate)  I have reviewed the triage vital signs and the nursing notes.   HISTORY  Chief Complaint Rib Injury    HPI Madison Singleton is a 26 y.o. female patient complaining of right clavicle and right lateral chest wall pain secondary to a fall yesterday. Patient rates pain as a 10 over 10. Patient stated pain increases with deep inspirations and overhead reaching. Patient described a pain as "achy". No palliative measures for complaint.  Past Medical History:  Diagnosis Date  . Anxiety    NO MEDS  . Breast mass   . Breast tenderness in female    3;00 x3 weeks  . Depression    NO MEDS  . GERD (gastroesophageal reflux disease)    OCC-NO MEDS  . Headache    HAS RECENTLY STARTED HAVING HA'S MORE FREQUENTLY    Patient Active Problem List   Diagnosis Date Noted  . Generalized anxiety disorder 07/11/2016  . Cocaine abuse (HCC) 07/11/2016    Past Surgical History:  Procedure Laterality Date  . KNEE ARTHROSCOPY WITH MEDIAL MENISECTOMY Right 09/07/2015   Procedure: Arthroscopic Lateral Release ;  Surgeon: Kennedy BuckerMichael Menz, MD;  Location: [facility] ORS;  Service: Orthopedics;  Laterality: Right;  . NO PAST SURGERIES      Prior to Admission medications   Medication Sig Start Date End Date Taking? Authorizing Provider  citalopram (CELEXA) 20 MG tablet Take 1 tablet (20 mg total) by mouth daily. 07/11/16   Clapacs, Jackquline DenmarkJohn T, MD  cyclobenzaprine (FLEXERIL) 10 MG tablet Take 1 tablet (10 mg total) by mouth 3 (three) times daily as needed for muscle spasms. Patient not taking: Reported on 07/11/2016 10/05/15   Evon SlackGaines, Thomas C, PA-C  diphenhydrAMINE (BENADRYL) 50 MG tablet Take 50 mg by mouth at bedtime.    [provider]  HYDROcodone-acetaminophen (NORCO) 5-325 MG tablet Take 1 tablet by mouth every  6 (six) hours as needed for moderate pain. Patient not taking: Reported on 07/11/2016 09/07/15   Kennedy BuckerMenz, Michael, MD  methocarbamol (ROBAXIN-750) 750 MG tablet Take 1 tablet (750 mg total) by mouth 4 (four) times daily. 10/29/16   Joni ReiningSmith, Beth Goodlin K, PA-C  naproxen (NAPROSYN) 500 MG tablet Take 1 tablet (500 mg total) by mouth 2 (two) times daily with a meal. 03/22/17   Joni ReiningSmith, Loreli Debruler K, PA-C  oxyCODONE-acetaminophen (ROXICET) 5-325 MG tablet Take 1 tablet by mouth every 4 (four) hours as needed for severe pain. Patient not taking: Reported on 07/11/2016 05/09/16   Enid DerryWagner, Ashley, PA-C  oxyCODONE-acetaminophen (ROXICET) 5-325 MG tablet Take 1 tablet by mouth every 4 (four) hours as needed for severe pain. 11/19/16   Arnaldo NatalMalinda, Paul F, MD  traMADol (ULTRAM) 50 MG tablet Take 1 tablet (50 mg total) by mouth every 6 (six) hours as needed for moderate pain. 10/29/16   Joni ReiningSmith, Kristyl Athens K, PA-C  traMADol (ULTRAM) 50 MG tablet Take 1 tablet (50 mg total) by mouth every 6 (six) hours as needed for moderate pain. 03/22/17   Joni ReiningSmith, Carmelite Violet K, PA-C    Allergies Haldol [haloperidol lactate] and Amoxicillin  Family History  Problem Relation Age of Onset  . Breast cancer Cousin     Social History Social History   Tobacco Use  . Smoking status: Current Every Day Smoker    Packs/day: 0.10    Years: 14.00    Pack years: 1.40  Types: Cigarettes  . Smokeless tobacco: Never Used  Substance Use Topics  . Alcohol use: Yes    Comment: SOCIAL  . Drug use: Yes    Types: Cocaine    Review of Systems  Constitutional: No fever/chills Eyes: No visual changes. ENT: No sore throat. Cardiovascular: Denies chest pain. Respiratory: Denies shortness of breath. Gastrointestinal: No abdominal pain.  No nausea, no vomiting.  No diarrhea.  No constipation. Genitourinary: Negative for dysuria. Musculoskeletal: Negative for back pain. Skin: Negative for rash. Neurological: Negative for headaches, focal weakness or  numbness. Psychiatric:Anxiety and depression ____________________________________________   PHYSICAL EXAM:  VITAL SIGNS: ED Triage Vitals [03/22/17 1325]  Enc Vitals Group     BP (!) 117/57     Pulse Rate 65     Resp 20     Temp 98.5 F (36.9 C)     Temp Source Oral     SpO2 98 %     Weight 185 lb (83.9 kg)     Height 5\' 1"  (1.549 m)     Head Circumference      Peak Flow      Pain Score 10     Pain Loc      Pain Edu?      Excl. in GC?    Constitutional: Alert and oriented. Well appearing and in no acute distress. Cardiovascular: Normal rate, regular rhythm. Grossly normal heart sounds.  Good peripheral circulation. Respiratory: Normal respiratory effort.  No retractions. Lungs CTAB. Musculoskeletal: No obvious deformities to the right clavicle or right lateral rib cage. Neurologic:  Normal speech and language. No gross focal neurologic deficits are appreciated. No gait instability. Skin:  Skin is warm, dry and intact. No rash noted. Psychiatric: Mood and affect are normal. Speech and behavior are normal.  ____________________________________________   LABS (all labs ordered are listed, but only abnormal results are displayed)  Labs Reviewed - No data to display ____________________________________________  EKG   ____________________________________________  RADIOLOGY  Dg Ribs Unilateral W/chest Right  Result Date: 03/22/2017 CLINICAL DATA:  Fall.  Pain to right collar bone and chest wall. EXAM: RIGHT RIBS AND CHEST - 3+ VIEW COMPARISON:  None. FINDINGS: No fracture or other bone lesions are seen involving the ribs. There is no evidence of pneumothorax or pleural effusion. Both lungs are clear. Heart size and mediastinal contours are within normal limits. IMPRESSION: Negative. Electronically Signed   By: Signa Kellaylor  Stroud M.D.   On: 03/22/2017 14:00    ____________________________________________   PROCEDURES  Procedure(s) performed:  None  Procedures  Critical Care performed: No  ____________________________________________   INITIAL IMPRESSION / ASSESSMENT AND PLAN / ED COURSE  As part of my medical decision making, I reviewed the following data within the electronic MEDICAL RECORD NUMBER    Right lateral rib pain secondary to fall. Discussed x-ray findings with patient. Patient given discharge care instructions. Patient denies take medications as directed and follow-up with PCP if condition persists. Patient given a work note for today.      ____________________________________________   FINAL CLINICAL IMPRESSION(S) / ED DIAGNOSES  Final diagnoses:  Rib contusion, right, initial encounter     ED Discharge Orders        Ordered    traMADol (ULTRAM) 50 MG tablet  Every 6 hours PRN     03/22/17 1452    naproxen (NAPROSYN) 500 MG tablet  2 times daily with meals     03/22/17 1452       Note:  This document  was prepared using Conservation officer, historic buildings and may include unintentional dictation errors.    Joni Reining, PA-C 03/22/17 1457    Willy Eddy, MD 03/22/17 [phone]

## 2017-03-22 NOTE — ED Triage Notes (Signed)
States missed step and fell yesterday, pain R collarbone and chest wall.

## 2017-09-23 ENCOUNTER — Other Ambulatory Visit: Payer: Self-pay | Admitting: Family Medicine

## 2017-09-23 ENCOUNTER — Ambulatory Visit
Admission: RE | Admit: 2017-09-23 | Discharge: 2017-09-23 | Disposition: A | Discharge status: Home / Self Care | Payer: Medicaid Other | Source: Ambulatory Visit | Attending: Family Medicine | Admitting: Family Medicine

## 2017-09-23 DIAGNOSIS — Z3201 Encounter for pregnancy test, result positive: Secondary | ICD-10-CM

## 2017-09-23 DIAGNOSIS — Z3689 Encounter for other specified antenatal screening: Secondary | ICD-10-CM | POA: Insufficient documentation

## 2017-09-23 DIAGNOSIS — Z3A12 12 weeks gestation of pregnancy: Secondary | ICD-10-CM | POA: Insufficient documentation

## 2017-10-31 ENCOUNTER — Telehealth: Payer: Self-pay | Admitting: Obstetrics and Gynecology

## 2017-10-31 NOTE — Telephone Encounter (Signed)
The patient called and stated that she missed a call from out office. Was not able to see any documentation of a missed call in the pt's chart. Please advise.

## 2017-10-31 NOTE — Telephone Encounter (Signed)
Pt aware I did not call her. At this time [facility].

## 2018-02-26 ENCOUNTER — Other Ambulatory Visit: Payer: Self-pay

## 2018-02-26 ENCOUNTER — Observation Stay
Admission: EM | Admit: 2018-02-26 | Discharge: 2018-02-26 | Discharge status: Against Medical Advice | Payer: Medicaid Other | Attending: Obstetrics and Gynecology | Admitting: Obstetrics and Gynecology

## 2018-02-26 DIAGNOSIS — Z3A34 34 weeks gestation of pregnancy: Secondary | ICD-10-CM | POA: Diagnosis not present

## 2018-02-26 DIAGNOSIS — O09899 Supervision of other high risk pregnancies, unspecified trimester: Principal | ICD-10-CM | POA: Insufficient documentation

## 2018-02-26 DIAGNOSIS — N898 Other specified noninflammatory disorders of vagina: Secondary | ICD-10-CM | POA: Diagnosis present

## 2018-02-26 DIAGNOSIS — O99333 Smoking (tobacco) complicating pregnancy, third trimester: Secondary | ICD-10-CM | POA: Diagnosis not present

## 2018-02-26 DIAGNOSIS — O26893 Other specified pregnancy related conditions, third trimester: Secondary | ICD-10-CM | POA: Diagnosis present

## 2018-02-26 DIAGNOSIS — N949 Unspecified condition associated with female genital organs and menstrual cycle: Secondary | ICD-10-CM | POA: Diagnosis present

## 2018-02-26 DIAGNOSIS — R102 Pelvic and perineal pain: Secondary | ICD-10-CM | POA: Diagnosis present

## 2018-02-26 LAB — CHLAMYDIA/NGC RT PCR (ARMC ONLY)
Chlamydia Tr: NOT DETECTED
N gonorrhoeae: NOT DETECTED

## 2018-02-26 LAB — URINALYSIS, ROUTINE W REFLEX MICROSCOPIC
Bilirubin Urine: NEGATIVE
Glucose, UA: NEGATIVE mg/dL
Hgb urine dipstick: NEGATIVE
Ketones, ur: NEGATIVE mg/dL
Leukocytes, UA: NEGATIVE
Nitrite: NEGATIVE
Protein, ur: NEGATIVE mg/dL
Specific Gravity, Urine: 1.002 — ABNORMAL LOW (ref 1.005–1.030)
pH: 7 (ref 5.0–8.0)

## 2018-02-26 LAB — URINE DRUG SCREEN, QUALITATIVE (ARMC ONLY)
Amphetamines, Ur Screen: NOT DETECTED
BARBITURATES, UR SCREEN: NOT DETECTED
Benzodiazepine, Ur Scrn: NOT DETECTED
Cannabinoid 50 Ng, Ur ~~LOC~~: NOT DETECTED
Cocaine Metabolite,Ur ~~LOC~~: POSITIVE — AB
MDMA (Ecstasy)Ur Screen: NOT DETECTED
Methadone Scn, Ur: NOT DETECTED
Opiate, Ur Screen: NOT DETECTED
Phencyclidine (PCP) Ur S: NOT DETECTED
TRICYCLIC, UR SCREEN: NOT DETECTED

## 2018-02-26 LAB — WET PREP, GENITAL
Sperm: NONE SEEN
Trich, Wet Prep: NONE SEEN
Yeast Wet Prep HPF POC: NONE SEEN

## 2018-02-26 LAB — RUPTURE OF MEMBRANE (ROM)PLUS: Rom Plus: NEGATIVE

## 2018-02-26 MED ORDER — FAMOTIDINE 20 MG PO TABS
ORAL_TABLET | ORAL | Status: AC
  Filled 2018-02-26: qty 1

## 2018-02-26 MED ORDER — METRONIDAZOLE 500 MG PO TABS: 500.0000 mg | ORAL_TABLET | Freq: Two times a day (BID) | ORAL | 0 refills | Status: AC

## 2018-02-26 MED ORDER — ACETAMINOPHEN 325 MG PO TABS: 650.0000 mg | ORAL_TABLET | ORAL | Status: DC | PRN

## 2018-02-26 MED ORDER — FAMOTIDINE 20 MG PO TABS
40.0000 mg | ORAL_TABLET | Freq: Once | ORAL | Status: AC
  Administered 2018-02-26: 40 mg via ORAL
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
## 2018-02-26 NOTE — Progress Notes (Signed)
Pt called out wanting to leave. I informed her that her test results were not back yet and that I would check on them. Pt then came to nursing station saying she was leaving AMA. I explained why it was important for her to stay until the results came back and encouraged her to stay. I contacted the Specialty Care Coordinator who contacted R. McVey, CNM at [phone]. McVey asked that [facility] to stay. Yvone NeuK. Cevallos, RN [facility] came to speak with the pt to encourage to her to stay for the results. Pt expressed that she was not staying and was going to leave. Pt stated she would call back in one hour for results and also left her phone number. Pt signed AMA paper work and left at [id number].

## 2018-03-19 ENCOUNTER — Emergency Department
Admission: EM | Admit: 2018-03-19 | Discharge: 2018-03-19 | Disposition: A | Discharge status: Home / Self Care | Payer: Medicaid Other | Attending: Emergency Medicine | Admitting: Emergency Medicine

## 2018-03-19 ENCOUNTER — Encounter: Payer: Self-pay | Admitting: Emergency Medicine

## 2018-03-19 ENCOUNTER — Other Ambulatory Visit: Payer: Self-pay

## 2018-03-19 DIAGNOSIS — H5713 Ocular pain, bilateral: Secondary | ICD-10-CM | POA: Diagnosis present

## 2018-03-19 DIAGNOSIS — F17210 Nicotine dependence, cigarettes, uncomplicated: Secondary | ICD-10-CM | POA: Insufficient documentation

## 2018-03-19 DIAGNOSIS — H18823 Corneal disorder due to contact lens, bilateral: Secondary | ICD-10-CM | POA: Insufficient documentation

## 2018-03-19 MED ORDER — FLUORESCEIN SODIUM 1 MG OP STRP
1.0000 | ORAL_STRIP | Freq: Once | OPHTHALMIC | Status: AC
  Administered 2018-03-19: 1 via OPHTHALMIC
  Filled 2018-03-19: qty 1

## 2018-03-19 MED ORDER — KETOROLAC TROMETHAMINE 0.5 % OP SOLN: 1.0000 [drp] | Freq: Four times a day (QID) | OPHTHALMIC | 0 refills | Status: DC

## 2018-03-19 MED ORDER — TETRACAINE HCL 0.5 % OP SOLN
2.0000 [drp] | Freq: Once | OPHTHALMIC | Status: AC
  Administered 2018-03-19: 2 [drp] via OPHTHALMIC
  Filled 2018-03-19: qty 4

## 2018-03-19 MED ORDER — ERYTHROMYCIN 5 MG/GM OP OINT
TOPICAL_OINTMENT | Freq: Once | OPHTHALMIC | Status: AC
  Administered 2018-03-19: 1 via OPHTHALMIC
  Filled 2018-03-19: qty 1

## 2018-03-19 MED ORDER — ERYTHROMYCIN 5 MG/GM OP OINT: 1.0000 "application " | TOPICAL_OINTMENT | Freq: Four times a day (QID) | OPHTHALMIC | 0 refills | Status: DC

## 2018-03-19 NOTE — ED Provider Notes (Signed)
[facility] Emergency Department Provider Note ____________________________________________  Time seen: Approximately 12:04 PM  I have reviewed the triage vital signs and the nursing notes.   HISTORY  Chief Complaint Eye Pain   HPI Madison Singleton is a 27 y.o. female who presents to the emergency department for treatment and evaluation of bilateral eye burning. She does wear contact lenses, but has removed them.   Past Medical History:  Diagnosis Date  . Anxiety    NO MEDS  . Breast mass   . Breast tenderness in female    3;00 x3 weeks  . Depression    NO MEDS  . GERD (gastroesophageal reflux disease)    OCC-NO MEDS  . Headache    HAS RECENTLY STARTED HAVING HA'S MORE FREQUENTLY    Patient Active Problem List   Diagnosis Date Noted  . Pelvic pressure in pregnancy, antepartum, third trimester 02/26/2018  . Generalized anxiety disorder 07/11/2016  . Cocaine abuse (HCC) 07/11/2016    Past Surgical History:  Procedure Laterality Date  . KNEE ARTHROSCOPY WITH MEDIAL MENISECTOMY Right 09/07/2015   Procedure: Arthroscopic Lateral Release ;  Surgeon: Kennedy BuckerMichael Menz, MD;  Location: [facility] ORS;  Service: Orthopedics;  Laterality: Right;  . NO PAST SURGERIES      Prior to Admission medications   Medication Sig Start Date End Date Taking? Authorizing Provider  acetaminophen (TYLENOL) 325 MG tablet Take 2 tablets (650 mg total) by mouth every 4 (four) hours as needed (for pain scale < 4  OR  temperature  >/=  100.5 F). 02/26/18   McVey, Prudencio Pairebecca A, CNM  erythromycin ophthalmic ointment Place 1 application into both eyes 4 (four) times daily. 03/19/18   Todd Argabright, Rulon Eisenmengerari B, FNP  ferrous sulfate 325 (65 FE) MG EC tablet Take 325 mg by mouth 3 (three) times daily with meals.    [provider]  ketorolac (ACULAR) 0.5 % ophthalmic solution Place 1 drop into both eyes 4 (four) times daily. 03/19/18   Darcel Frane, Kasandra Knudsenari B, FNP  Prenatal Vit-Fe Fumarate-FA (PRENATAL  MULTIVITAMIN) TABS tablet Take 1 tablet by mouth daily at 12 noon.    [provider]  QUEtiapine (SEROQUEL) 100 MG tablet Take by mouth at bedtime.    [provider]  risperiDONE (RISPERDAL) 0.25 MG tablet Take 0.25 mg by mouth at bedtime.    [provider]    Allergies Haldol [haloperidol lactate] and Amoxicillin  Family History  Problem Relation Age of Onset  . Breast cancer Cousin     Social History Social History   Tobacco Use  . Smoking status: Current Every Day Smoker    Packs/day: 0.10    Years: 14.00    Pack years: 1.40    Types: Cigarettes  . Smokeless tobacco: Never Used  Substance Use Topics  . Alcohol use: Not Currently    Comment: SOCIAL  . Drug use: Not Currently    Types: Cocaine    Review of Systems   Constitutional: No fever/chills Eyes: Positive for visual changes. positive for pain. Positive for clear drainage. Musculoskeletal: Negative for pain. Skin: Negative for rash. Neurological: Negative for headaches, focal weakness or numbness. Allergic: Negative for seasonal allergies. ____________________________________________  PHYSICAL EXAM:  VITAL SIGNS: ED Triage Vitals  Enc Vitals Group     BP 03/19/18 1157 122/75     Pulse Rate 03/19/18 1157 81     Resp 03/19/18 1157 17     Temp 03/19/18 1157 98.4 F (36.9 C)     Temp  Source 03/19/18 1157 Oral     SpO2 03/19/18 1157 99 %     Weight 03/19/18 1158 179 lb (81.2 kg)     Height 03/19/18 1158 5\' 1"  (1.549 m)     Head Circumference --      Peak Flow --      Pain Score --      Pain Loc --      Pain Edu? --      Excl. in GC? --     Constitutional: Alert and oriented. Well appearing and in no acute distress. Eyes: Visual acuity--see nursing documentation; no globe trauma; Eyelids normal to inspection; Sclera appears anicteric.  Eyelids were inverted. Conjunctiva appears injected and erythematous; Cornea: 1mm abrasion on the left at about 3 o'clock; 2mm abrasion on  the left at about 12 o'clock. Head: Atraumatic. Nose: No congestion/rhinnorhea. Mouth/Throat: Mucous membranes are moist.  Oropharynx non-erythematous. Respiratory: Respirations even and unlabored. Breath sounds clear to auscultation. Musculoskeletal:Normal ROM x 4 extremities. Neurologic:  Normal speech and language. No gross focal neurologic deficits are appreciated. Speech is normal. No gait instability. Skin:  Skin is warm, dry and intact. No rash noted. Psychiatric: Mood and affect are normal. Speech and behavior are normal.  ____________________________________________   LABS (all labs ordered are listed, but only abnormal results are displayed)  Labs Reviewed - No data to display ____________________________________________  EKG  Not indicated ____________________________________________  RADIOLOGY  Not indicated ____________________________________________   PROCEDURES  Procedure(s) performed: None ____________________________________________   INITIAL IMPRESSION / ASSESSMENT AND PLAN / ED COURSE  27 year old female presenting to the emergency department for treatment and evaluation of bilateral eye pain and watering. She wore some non prescription contact lenses yesterday and fell asleep in them. She wakened this morning about 5am and took them out because her eyes were hurting. Since then, she has had photophobia and clear watery drainage. On exam, she does have small corneal abrasions in both eyes. She will be treated with erythromycin ophthalmic ointment and Acular. She is to follow [facility] with [facility] if not improving over the next few days. If unable to schedule an appointment, she will return to the ER for concerns.  Pertinent labs & imaging results that were available during my care of the patient were reviewed by me and considered in my medical decision making (see chart for details). ____________________________________________   FINAL CLINICAL  IMPRESSION(S) / ED DIAGNOSES  Final diagnoses:  Corneal abrasion of both eyes due to contact lens    Note:  This document was prepared using Dragon voice recognition software and may include unintentional dictation errors.    Chinita Pesterriplett, Hildagard Sobecki B, FNP 03/19/18 1259    Minna AntisPaduchowski, Kevin, MD 03/19/18 1429

## 2018-03-19 NOTE — ED Triage Notes (Signed)
Presents via EMS  For bilateral eye burning  States she was wearing contacts and removed them

## 2018-03-19 NOTE — Discharge Instructions (Signed)
Follow up with the ophthalmologist if not improving over the next few days.  Return to the ER for symptoms that change or worsen if unable to schedule an appointment.

## 2018-03-25 ENCOUNTER — Other Ambulatory Visit: Payer: Self-pay

## 2018-03-25 ENCOUNTER — Inpatient Hospital Stay
Admission: EM | Admit: 2018-03-25 | Discharge: 2018-03-27 | DRG: 806 | Disposition: A | Discharge status: Home / Self Care | Payer: Medicaid Other | Attending: Obstetrics and Gynecology | Admitting: Obstetrics and Gynecology

## 2018-03-25 ENCOUNTER — Encounter: Payer: Self-pay | Admitting: Obstetrics and Gynecology

## 2018-03-25 DIAGNOSIS — R12 Heartburn: Secondary | ICD-10-CM | POA: Diagnosis present

## 2018-03-25 DIAGNOSIS — O9962 Diseases of the digestive system complicating childbirth: Secondary | ICD-10-CM | POA: Diagnosis present

## 2018-03-25 DIAGNOSIS — O99344 Other mental disorders complicating childbirth: Secondary | ICD-10-CM | POA: Diagnosis present

## 2018-03-25 DIAGNOSIS — F329 Major depressive disorder, single episode, unspecified: Secondary | ICD-10-CM | POA: Diagnosis present

## 2018-03-25 DIAGNOSIS — O99324 Drug use complicating childbirth: Secondary | ICD-10-CM | POA: Diagnosis present

## 2018-03-25 DIAGNOSIS — F1410 Cocaine abuse, uncomplicated: Secondary | ICD-10-CM | POA: Diagnosis present

## 2018-03-25 DIAGNOSIS — O99334 Smoking (tobacco) complicating childbirth: Secondary | ICD-10-CM | POA: Diagnosis present

## 2018-03-25 DIAGNOSIS — F4310 Post-traumatic stress disorder, unspecified: Secondary | ICD-10-CM | POA: Diagnosis present

## 2018-03-25 DIAGNOSIS — Z3A38 38 weeks gestation of pregnancy: Secondary | ICD-10-CM | POA: Diagnosis not present

## 2018-03-25 DIAGNOSIS — Z3483 Encounter for supervision of other normal pregnancy, third trimester: Secondary | ICD-10-CM | POA: Diagnosis present

## 2018-03-25 DIAGNOSIS — F17210 Nicotine dependence, cigarettes, uncomplicated: Secondary | ICD-10-CM | POA: Diagnosis present

## 2018-03-25 DIAGNOSIS — O0993 Supervision of high risk pregnancy, unspecified, third trimester: Secondary | ICD-10-CM

## 2018-03-25 LAB — CBC
HCT: 36.4 % (ref 36.0–46.0)
Hemoglobin: 11 g/dL — ABNORMAL LOW (ref 12.0–15.0)
MCH: 23.5 pg — ABNORMAL LOW (ref 26.0–34.0)
MCHC: 30.2 g/dL (ref 30.0–36.0)
MCV: 77.6 fL — ABNORMAL LOW (ref 80.0–100.0)
NRBC: 0 % (ref 0.0–0.2)
Platelets: 425 10*3/uL — ABNORMAL HIGH (ref 150–400)
RBC: 4.69 MIL/uL (ref 3.87–5.11)
RDW: 18.6 % — ABNORMAL HIGH (ref 11.5–15.5)
WBC: 14.1 10*3/uL — ABNORMAL HIGH (ref 4.0–10.5)

## 2018-03-25 MED ORDER — LIDOCAINE HCL (PF) 1 % IJ SOLN
INTRAMUSCULAR | Status: AC
  Filled 2018-03-25: qty 30

## 2018-03-25 MED ORDER — KETOROLAC TROMETHAMINE 30 MG/ML IJ SOLN
30.0000 mg | Freq: Once | INTRAMUSCULAR | Status: AC
  Administered 2018-03-25: 30 mg via INTRAVENOUS

## 2018-03-25 MED ORDER — ACETAMINOPHEN 325 MG PO TABS: 650.0000 mg | ORAL_TABLET | ORAL | Status: DC | PRN

## 2018-03-25 MED ORDER — LIDOCAINE HCL (PF) 1 % IJ SOLN: 30.0000 mL | INTRAMUSCULAR | Status: DC | PRN

## 2018-03-25 MED ORDER — IBUPROFEN 600 MG PO TABS
600.0000 mg | ORAL_TABLET | Freq: Four times a day (QID) | ORAL | Status: DC
  Administered 2018-03-25 – 2018-03-27 (×7): 600 mg via ORAL
  Filled 2018-03-25 (×7): qty 1

## 2018-03-25 MED ORDER — SOD CITRATE-CITRIC ACID 500-334 MG/5ML PO SOLN: 30.0000 mL | ORAL | Status: DC | PRN

## 2018-03-25 MED ORDER — BUTORPHANOL TARTRATE 2 MG/ML IJ SOLN: 1.0000 mg | INTRAMUSCULAR | Status: DC | PRN

## 2018-03-25 MED ORDER — MEDROXYPROGESTERONE ACETATE 150 MG/ML IM SUSP
150.0000 mg | INTRAMUSCULAR | Status: DC
  Administered 2018-03-27: 150 mg via INTRAMUSCULAR
  Filled 2018-03-25 (×2): qty 1

## 2018-03-25 MED ORDER — LACTATED RINGERS IV SOLN: INTRAVENOUS | Status: DC

## 2018-03-25 MED ORDER — AMMONIA AROMATIC IN INHA
RESPIRATORY_TRACT | Status: AC
  Filled 2018-03-25: qty 10

## 2018-03-25 MED ORDER — SODIUM CHLORIDE FLUSH 0.9 % IV SOLN
INTRAVENOUS | Status: AC
  Filled 2018-03-25: qty 10

## 2018-03-25 MED ORDER — MISOPROSTOL 200 MCG PO TABS
ORAL_TABLET | ORAL | Status: AC
  Filled 2018-03-25: qty 4

## 2018-03-25 MED ORDER — ONDANSETRON HCL 4 MG/2ML IJ SOLN: 4.0000 mg | Freq: Four times a day (QID) | INTRAMUSCULAR | Status: DC | PRN

## 2018-03-25 MED ORDER — OXYTOCIN 40 UNITS IN LACTATED RINGERS INFUSION - SIMPLE MED
2.5000 [IU]/h | INTRAVENOUS | Status: DC
  Administered 2018-03-25: 2.5 [IU]/h via INTRAVENOUS
  Filled 2018-03-25: qty 1000

## 2018-03-25 MED ORDER — OXYTOCIN BOLUS FROM INFUSION
500.0000 mL | Freq: Once | INTRAVENOUS | Status: AC
  Administered 2018-03-25: 500 mL via INTRAVENOUS

## 2018-03-25 MED ORDER — OXYTOCIN 10 UNIT/ML IJ SOLN
INTRAMUSCULAR | Status: AC
  Filled 2018-03-25: qty 2

## 2018-03-25 MED ORDER — KETOROLAC TROMETHAMINE 30 MG/ML IJ SOLN
INTRAMUSCULAR | Status: AC
  Filled 2018-03-25: qty 1

## 2018-03-25 MED ORDER — LACTATED RINGERS IV SOLN: 500.0000 mL | INTRAVENOUS | Status: DC | PRN

## 2018-03-25 MED ORDER — ACETAMINOPHEN 325 MG PO TABS
650.0000 mg | ORAL_TABLET | ORAL | Status: DC | PRN
  Administered 2018-03-26 – 2018-03-27 (×8): 650 mg via ORAL
  Filled 2018-03-25 (×8): qty 2

## 2018-03-25 NOTE — L&D Delivery Note (Addendum)
Delivery Note - Precipitous vaginal delivery  At 9:39 PM a viable and healthy female was delivered via Vaginal, Spontaneous (Presentation: LOA).  APGAR: pending; weight pending .   Placenta status: spontaneous,intact.  Cord:  with the following complications: none  Anesthesia:  none Episiotomy: None Lacerations:  none Est. Blood Loss (mL):  300  Mom to postpartum.  Baby to Couplet care / Skin to Skin.  Received a call that pt was arriving by EMS, ready to deliver. Met patient in the room, and she delivered with 3 contractions.  28yo [id number] at 38+3wks with a hx of insufficient care presenting in active labor. She was completely dilated at +2 station on arrival with a bulging bag. She pushed over an intact perineum and delivered a vigorous baby girl who was placed on maternal abdomen. Delayed cord clamping x1 min, placenta delivered spontaneously intact. No lacerations. Fundus firm and iv postpartum pit started. She tolerated well.  Benjaman Kindler 03/25/2018, 9:51 PM

## 2018-03-25 NOTE — Plan of Care (Signed)
Reviewed plan of care with patient. All questions answered. Will monitor closely. 
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
## 2018-03-25 NOTE — OB Triage Note (Signed)
Pt presents via EMS with urge to push, contractions. Denies leaking of fluid, decreased fetal movement, or vaginal bleeding. MD Dalbert Garnet called to bedside for delivery.
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
## 2018-03-26 LAB — URINE DRUG SCREEN, QUALITATIVE (ARMC ONLY)
Amphetamines, Ur Screen: NOT DETECTED
Barbiturates, Ur Screen: NOT DETECTED
Benzodiazepine, Ur Scrn: NOT DETECTED
Cannabinoid 50 Ng, Ur ~~LOC~~: NOT DETECTED
Cocaine Metabolite,Ur ~~LOC~~: POSITIVE — AB
MDMA (Ecstasy)Ur Screen: NOT DETECTED
METHADONE SCREEN, URINE: NOT DETECTED
Opiate, Ur Screen: NOT DETECTED
Phencyclidine (PCP) Ur S: NOT DETECTED
TRICYCLIC, UR SCREEN: NOT DETECTED

## 2018-03-26 LAB — CBC
HCT: 32.9 % — ABNORMAL LOW (ref 36.0–46.0)
Hemoglobin: 10.3 g/dL — ABNORMAL LOW (ref 12.0–15.0)
MCH: 23.7 pg — ABNORMAL LOW (ref 26.0–34.0)
MCHC: 31.3 g/dL (ref 30.0–36.0)
MCV: 75.6 fL — ABNORMAL LOW (ref 80.0–100.0)
Platelets: 373 10*3/uL (ref 150–400)
RBC: 4.35 MIL/uL (ref 3.87–5.11)
RDW: 17.9 % — ABNORMAL HIGH (ref 11.5–15.5)
WBC: 10.7 10*3/uL — AB (ref 4.0–10.5)
nRBC: 0 % (ref 0.0–0.2)

## 2018-03-26 LAB — TYPE AND SCREEN
ABO/RH(D): AB POS
Antibody Screen: NEGATIVE

## 2018-03-26 LAB — RAPID HIV SCREEN (HIV 1/2 AB+AG)
HIV 1/2 Antibodies: NONREACTIVE
HIV-1 P24 Antigen - HIV24: NONREACTIVE

## 2018-03-26 MED ORDER — ZOLPIDEM TARTRATE 5 MG PO TABS: 5.0000 mg | ORAL_TABLET | Freq: Every evening | ORAL | Status: DC | PRN

## 2018-03-26 MED ORDER — FLEET ENEMA 7-19 GM/118ML RE ENEM: 1.0000 | ENEMA | Freq: Every day | RECTAL | Status: DC | PRN

## 2018-03-26 MED ORDER — PANTOPRAZOLE SODIUM 20 MG PO TBEC
20.0000 mg | DELAYED_RELEASE_TABLET | Freq: Every day | ORAL | Status: DC
  Administered 2018-03-26 – 2018-03-27 (×2): 20 mg via ORAL
  Filled 2018-03-26 (×2): qty 1

## 2018-03-26 MED ORDER — SODIUM CHLORIDE 0.9% FLUSH: 3.0000 mL | Freq: Two times a day (BID) | INTRAVENOUS | Status: DC

## 2018-03-26 MED ORDER — MEASLES, MUMPS & RUBELLA VAC IJ SOLR
0.5000 mL | Freq: Once | INTRAMUSCULAR | Status: AC
  Administered 2018-03-27: 0.5 mL via SUBCUTANEOUS
  Filled 2018-03-26 (×2): qty 0.5

## 2018-03-26 MED ORDER — COCONUT OIL OIL: 1.0000 "application " | TOPICAL_OIL | Status: DC | PRN

## 2018-03-26 MED ORDER — WITCH HAZEL-GLYCERIN EX PADS: 1.0000 "application " | MEDICATED_PAD | CUTANEOUS | Status: DC | PRN

## 2018-03-26 MED ORDER — DIPHENHYDRAMINE HCL 25 MG PO CAPS: 25.0000 mg | ORAL_CAPSULE | Freq: Four times a day (QID) | ORAL | Status: DC | PRN

## 2018-03-26 MED ORDER — PRENATAL MULTIVITAMIN CH
1.0000 | ORAL_TABLET | Freq: Every day | ORAL | Status: DC
  Administered 2018-03-26 – 2018-03-27 (×2): 1 via ORAL
  Filled 2018-03-26 (×2): qty 1

## 2018-03-26 MED ORDER — OXYCODONE HCL 5 MG PO TABS
5.0000 mg | ORAL_TABLET | ORAL | Status: DC | PRN
  Administered 2018-03-26 – 2018-03-27 (×8): 5 mg via ORAL
  Filled 2018-03-26 (×8): qty 1

## 2018-03-26 MED ORDER — SODIUM CHLORIDE 0.9 % IV SOLN: 250.0000 mL | INTRAVENOUS | Status: DC | PRN

## 2018-03-26 MED ORDER — RISPERIDONE 0.25 MG PO TABS
0.2500 mg | ORAL_TABLET | Freq: Every day | ORAL | Status: DC
  Administered 2018-03-26: 0.25 mg via ORAL
  Filled 2018-03-26 (×3): qty 1

## 2018-03-26 MED ORDER — ONDANSETRON HCL 4 MG PO TABS: 4.0000 mg | ORAL_TABLET | ORAL | Status: DC | PRN

## 2018-03-26 MED ORDER — TETANUS-DIPHTH-ACELL PERTUSSIS 5-2.5-18.5 LF-MCG/0.5 IM SUSP
0.5000 mL | Freq: Once | INTRAMUSCULAR | Status: DC
  Filled 2018-03-26: qty 0.5

## 2018-03-26 MED ORDER — QUETIAPINE FUMARATE 100 MG PO TABS
100.0000 mg | ORAL_TABLET | Freq: Every day | ORAL | Status: DC
  Administered 2018-03-26 (×2): 100 mg via ORAL
  Filled 2018-03-26 (×3): qty 1

## 2018-03-26 MED ORDER — ONDANSETRON HCL 4 MG/2ML IJ SOLN: 4.0000 mg | INTRAMUSCULAR | Status: DC | PRN

## 2018-03-26 MED ORDER — SIMETHICONE 80 MG PO CHEW
80.0000 mg | CHEWABLE_TABLET | ORAL | Status: DC | PRN
  Administered 2018-03-26: 80 mg via ORAL
  Filled 2018-03-26: qty 1

## 2018-03-26 MED ORDER — DIBUCAINE 1 % RE OINT: 1.0000 "application " | TOPICAL_OINTMENT | RECTAL | Status: DC | PRN

## 2018-03-26 MED ORDER — SODIUM CHLORIDE 0.9% FLUSH: 3.0000 mL | INTRAVENOUS | Status: DC | PRN

## 2018-03-26 MED ORDER — SENNOSIDES-DOCUSATE SODIUM 8.6-50 MG PO TABS
2.0000 | ORAL_TABLET | ORAL | Status: DC
  Administered 2018-03-26 – 2018-03-27 (×2): 2 via ORAL
  Filled 2018-03-26 (×2): qty 2

## 2018-03-26 MED ORDER — BISACODYL 10 MG RE SUPP: 10.0000 mg | Freq: Every day | RECTAL | Status: DC | PRN

## 2018-03-26 MED ORDER — BENZOCAINE-MENTHOL 20-0.5 % EX AERO: 1.0000 "application " | INHALATION_SPRAY | CUTANEOUS | Status: DC | PRN

## 2018-03-26 MED ORDER — CLONAZEPAM 0.5 MG PO TABS
1.0000 mg | ORAL_TABLET | Freq: Once | ORAL | Status: AC
  Administered 2018-03-26: 1 mg via ORAL
  Filled 2018-03-26: qty 2

## 2018-03-26 NOTE — Progress Notes (Signed)
Post Partum Day 1 Subjective: Doing well, no complaints except requesting clonazepam that she had been rx by her PCP prenatally. Reports increased anxiety. Tolerating regular diet, pain with PO meds, voiding and ambulating without difficulty.  No CP SOB Fever,Chills, N/V or leg pain; denies nipple or breast pain; no HA change of vision, RUQ/epigastric pain  Objective: BP 104/62 (BP Location: Right Arm)   Pulse 79   Temp 98 F (36.7 C) (Oral)   Resp 20   Ht '5\' 1"'  (1.549 m)   Wt 80.7 kg   LMP 06/29/2017   SpO2 98%   BMI 33.63 kg/m    Physical Exam:  General: NAD Breasts: soft/nontender CV: RRR Pulm: nl effort, CTABL Abdomen: soft, NT, BS x 4 Perineum: minimal edema, intact Lochia: small Uterine Fundus: fundus firm and 2 fb below umbilicus DVT Evaluation: no cords, ttp LEs   Recent Labs    03/25/18 2228 03/26/18 0507  HGB 11.0* 10.3*  HCT 36.4 32.9*  WBC 14.1* 10.7*  PLT 425* 373    Assessment/Plan: 28 y.o. [id number] postpartum day # 1  - Rx clonazepam, confirmed previous Rx. Will order BID prn while in hospital. - Substance abuse, cocaine + on UDS on several occasions. SW consult placed for follow-up.   - Continue routine PP care; pt requesting heartburn meds,  protonix 53m pO daily.  - Formula feeding, encouraged snug fitting bra and cabbage leaves for bottlefeeding.  - Discussed contraceptive options including implant, IUDs hormonal and non-hormonal, injection, pills/ring/patch, condoms, and NFP. Pt desires Depo prior to leaving hospital.  - Immunization status: Needs  tdap, MMR prior to DC    Disposition: Does not desire Dc home today.     RFrancetta Found CNM 03/26/2018  1:29 PM
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
## 2018-03-27 LAB — RPR: RPR Ser Ql: NONREACTIVE

## 2018-03-27 MED ORDER — OXYCODONE HCL 5 MG PO TABS: 5.0000 mg | ORAL_TABLET | ORAL | 0 refills | Status: DC | PRN

## 2018-03-27 MED ORDER — MEDROXYPROGESTERONE ACETATE 150 MG/ML IM SUSP: 150.0000 mg | INTRAMUSCULAR | 4 refills | Status: DC

## 2018-03-27 MED ORDER — IBUPROFEN 600 MG PO TABS: 600.0000 mg | ORAL_TABLET | Freq: Four times a day (QID) | ORAL | 0 refills | Status: DC

## 2018-03-27 NOTE — Progress Notes (Signed)
Pt called out from bathroom and needed an ice pack; nurse tech brought ice packs to pt; nurse tech noticed baby left in pt's bed while pt in bathroom; this is 2nd time nurse tech has noticed this; two times nurse tech has said to pt that she really should not leave the baby in the big bed while pt is going to the bathroom that it is not safe

## 2018-03-27 NOTE — Clinical Social Work Note (Signed)
Patient's DSS case was reassigned from [place] to [place] with DSS CPS. was able to interview patient while still here at hospital and has a safety plan in place. According to DSS CPS, patient is going to live with her husband at discharge as he will be the primary caretaker for the infant. He is also the primary caretaker for her 30 year old. DSS CPS will follow as outpatient. York SpanielMonica Jessicia Napolitano MSW,LCSW [phone]

## 2018-03-27 NOTE — Plan of Care (Signed)
Vs stable; up ad lib; tolerating regular diet; taking motrin, tylenol and roxicodone for pain control; bottle feeding baby; sometimes needs to be reminded to write down feedings for baby so she will know when the baby ate last and when the baby should eat again

## 2018-03-27 NOTE — Progress Notes (Signed)
Pt discharged with infant after DSS visit. Discharge instructions, prescriptions and follow up appointment given to and reviewed with pt. Pt verbalized understanding. Escorted out by staff and DSS.

## 2018-05-08 ENCOUNTER — Other Ambulatory Visit: Payer: Self-pay | Admitting: Internal Medicine

## 2018-05-08 DIAGNOSIS — G43001 Migraine without aura, not intractable, with status migrainosus: Secondary | ICD-10-CM

## 2018-05-14 ENCOUNTER — Emergency Department
Admission: EM | Admit: 2018-05-14 | Discharge: 2018-05-14 | Disposition: A | Discharge status: Home / Self Care | Payer: Medicaid Other | Attending: Dermatology | Admitting: Dermatology

## 2018-05-14 ENCOUNTER — Other Ambulatory Visit: Payer: Self-pay

## 2018-05-14 ENCOUNTER — Emergency Department: Payer: Medicaid Other

## 2018-05-14 ENCOUNTER — Encounter: Payer: Self-pay | Admitting: Emergency Medicine

## 2018-05-14 DIAGNOSIS — Y9289 Other specified places as the place of occurrence of the external cause: Secondary | ICD-10-CM | POA: Insufficient documentation

## 2018-05-14 DIAGNOSIS — M25512 Pain in left shoulder: Secondary | ICD-10-CM

## 2018-05-14 DIAGNOSIS — Y9389 Activity, other specified: Secondary | ICD-10-CM | POA: Insufficient documentation

## 2018-05-14 DIAGNOSIS — S2231XA Fracture of one rib, right side, initial encounter for closed fracture: Secondary | ICD-10-CM | POA: Insufficient documentation

## 2018-05-14 DIAGNOSIS — S022XXA Fracture of nasal bones, initial encounter for closed fracture: Secondary | ICD-10-CM

## 2018-05-14 DIAGNOSIS — F17210 Nicotine dependence, cigarettes, uncomplicated: Secondary | ICD-10-CM | POA: Insufficient documentation

## 2018-05-14 DIAGNOSIS — Y998 Other external cause status: Secondary | ICD-10-CM | POA: Diagnosis not present

## 2018-05-14 DIAGNOSIS — Z79899 Other long term (current) drug therapy: Secondary | ICD-10-CM | POA: Insufficient documentation

## 2018-05-14 DIAGNOSIS — S4992XA Unspecified injury of left shoulder and upper arm, initial encounter: Secondary | ICD-10-CM | POA: Diagnosis present

## 2018-05-14 MED ORDER — IBUPROFEN 600 MG PO TABS
600.0000 mg | ORAL_TABLET | Freq: Once | ORAL | Status: AC
  Administered 2018-05-14: 600 mg via ORAL
  Filled 2018-05-14: qty 1

## 2018-05-14 MED ORDER — OXYCODONE-ACETAMINOPHEN 7.5-325 MG PO TABS: 1.0000 | ORAL_TABLET | Freq: Four times a day (QID) | ORAL | 0 refills | Status: DC | PRN

## 2018-05-14 MED ORDER — CYCLOBENZAPRINE HCL 10 MG PO TABS: 10.0000 mg | ORAL_TABLET | Freq: Three times a day (TID) | ORAL | 0 refills | Status: DC | PRN

## 2018-05-14 MED ORDER — OXYCODONE-ACETAMINOPHEN 5-325 MG PO TABS
1.0000 | ORAL_TABLET | Freq: Once | ORAL | Status: AC
  Administered 2018-05-14: 1 via ORAL
  Filled 2018-05-14: qty 1

## 2018-05-14 MED ORDER — LIDOCAINE 5 % EX PTCH
1.0000 | MEDICATED_PATCH | CUTANEOUS | Status: DC
  Administered 2018-05-14: 1 via TRANSDERMAL
  Filled 2018-05-14: qty 1

## 2018-05-14 MED ORDER — IBUPROFEN 600 MG PO TABS: 600.0000 mg | ORAL_TABLET | Freq: Three times a day (TID) | ORAL | 0 refills | Status: DC | PRN
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
## 2018-05-14 NOTE — ED Notes (Signed)
AAOx3.  Skin warm and dry.  Ambulates with easy and steady gait. NAD 

## 2018-05-19 ENCOUNTER — Ambulatory Visit: Payer: Medicaid Other | Attending: Internal Medicine

## 2018-06-02 ENCOUNTER — Other Ambulatory Visit: Payer: Self-pay

## 2018-06-02 ENCOUNTER — Ambulatory Visit
Admission: RE | Admit: 2018-06-02 | Discharge: 2018-06-02 | Disposition: A | Discharge status: Home / Self Care | Payer: Medicaid Other | Source: Ambulatory Visit | Attending: Internal Medicine | Admitting: Internal Medicine

## 2018-06-02 DIAGNOSIS — G43001 Migraine without aura, not intractable, with status migrainosus: Secondary | ICD-10-CM | POA: Diagnosis not present

## 2018-12-24 HISTORY — PX: MOUTH SURGERY: SHX715

## 2019-01-18 ENCOUNTER — Other Ambulatory Visit: Payer: Self-pay

## 2019-01-18 ENCOUNTER — Encounter: Payer: Self-pay | Admitting: Emergency Medicine

## 2019-01-18 ENCOUNTER — Emergency Department
Admission: EM | Admit: 2019-01-18 | Discharge: 2019-01-18 | Disposition: A | Discharge status: Home / Self Care | Payer: Medicaid Other | Attending: Emergency Medicine | Admitting: Emergency Medicine

## 2019-01-18 DIAGNOSIS — F17210 Nicotine dependence, cigarettes, uncomplicated: Secondary | ICD-10-CM | POA: Diagnosis not present

## 2019-01-18 DIAGNOSIS — K0889 Other specified disorders of teeth and supporting structures: Secondary | ICD-10-CM | POA: Insufficient documentation

## 2019-01-18 MED ORDER — TRAMADOL HCL 50 MG PO TABS
50.0000 mg | ORAL_TABLET | Freq: Once | ORAL | Status: AC
  Administered 2019-01-18: 50 mg via ORAL
  Filled 2019-01-18: qty 1

## 2019-01-18 MED ORDER — IBUPROFEN 600 MG PO TABS: 600.0000 mg | ORAL_TABLET | Freq: Three times a day (TID) | ORAL | 0 refills | Status: DC | PRN

## 2019-01-18 MED ORDER — CLINDAMYCIN HCL 150 MG PO CAPS: ORAL_CAPSULE | ORAL | 0 refills | Status: DC

## 2019-01-18 NOTE — Discharge Instructions (Signed)
Begin taking antibiotics as directed and ibuprofen as needed for inflammation pain.  You will need to see a dentist.  A list of dentists is listed on your discharge papers.  Also consider the walk-in clinic at [facility] this information is listed on a separate sheet.  OPTIONS FOR DENTAL FOLLOW UP CARE  [place] [facility] and [place] [email or web address] [phone])  [facility] [phone])  [place] [phone])  [place] [phone])  [facility] [phone]) This clinic caters to the indigent population and is on a lottery system. Location: [facility], [place], [place], [place] [facility] Hours: Wednesdays from 6pm - 9pm, patients seen by a lottery system. For dates, call or go to [email or web address] Services: Cleanings, fillings and simple extractions. Payment Options: DENTAL WORK IS FREE OF CHARGE. Bring proof of income or support. Best way to get seen: Arrive at 5:15 pm - this is a lottery, NOT first come/first serve, so arriving earlier will not increase your chances of being seen.     [facility] [phone] Select option 1 for emergencies   Location: [facility], [place], [address] [place] [facility] Hours: No walk-ins accepted - call the day before to schedule an appointment. Check in times are 9:30 am and 1:30 pm. Services: Simple extractions, temporary fillings, pulpectomy/pulp debridement, uncomplicated abscess drainage. Payment Options: PAYMENT IS DUE AT THE TIME OF SERVICE.  Fee is usually $100-200, additional surgical procedures (e.g. abscess drainage) may be extra. Cash, checks, Visa/MasterCard accepted.  Can file Medicaid if patient is covered for dental - patient  should call case worker to check. No discount for [facility] patients. Best way to get seen: MUST call the day before and get onto the schedule. Can usually be seen the next 1-2 days. No walk-ins accepted.     [place] [phone]   Location: [place], [place] [facility] Hours: M, W, Th, F 8am or 1:30pm, Tues 9a or 1:30 - first come/first served. Services: Simple extractions, temporary fillings, uncomplicated abscess drainage.  You do not need to be an [facility] resident. Payment Options: PAYMENT IS DUE AT THE TIME OF SERVICE. Dental insurance, otherwise sliding scale - bring proof of income or support. Depending on income and treatment needed, cost is usually $50-200. Best way to get seen: Arrive early as it is first come/first served.     [facility] [phone]   Location: [place] [facility] Hours: Mon-Thu 8a-5p Services: Most basic dental services including extractions and fillings. Payment Options: PAYMENT IS DUE AT THE TIME OF SERVICE. Sliding scale, up to 50% off - bring proof if income or support. Medicaid with dental option accepted. Best way to get seen: Call to schedule an appointment, can usually be seen within 2 weeks OR they will try to see walk-ins - show up at [place] or 2p (you may have to wait).     [facility] [place] RESIDENTS ONLY   Location: [facility], [place] [address] [place], [place] [address] Clinic Hours: By appointment only. Monday - Thursday 8am-5pm, Friday 8am-12pm Services: Cleanings, fillings, extractions. Payment Options: PAYMENT IS DUE AT THE TIME OF SERVICE. Cash, Visa or MasterCard. Sliding scale - $30 minimum per service. Best way to get seen: Come in to  office, complete packet and make an appointment - need proof of income or support monies for each household member  and proof of [facility] residence. Usually takes about a month to get in.     [facility] [phone]   Location: [address] [place] [facility] Hours: Walk-in Urgent Care Dental Services are offered Monday-Friday mornings only. The numbers of emergencies accepted daily is limited to the number of providers available. Maximum 15 - Mondays, Wednesdays & Thursdays Maximum 10 - Tuesdays & Fridays Services: You do not need to be a [facility] resident to be seen for a dental emergency. Emergencies are defined as pain, swelling, abnormal bleeding, or dental trauma. Walkins will receive x-rays if needed. NOTE: Dental cleaning is not an emergency. Payment Options: PAYMENT IS DUE AT THE TIME OF SERVICE. Minimum co-pay is $40.00 for uninsured patients. Minimum co-pay is $3.00 for Medicaid with dental coverage. Dental Insurance is accepted and must be presented at time of visit. Medicare does not cover dental. Forms of payment: Cash, credit card, checks. Best way to get seen: If not previously registered with the clinic, walk-in dental registration begins at 7:15 am and is on a first come/first serve basis. If previously registered with the clinic, call to make an appointment.     [facility] [place] ONLY   Location: [address] [place], [place] Clinic Hours: Mon-Thu 10a-2p Services: Extractions only! Payment Options: FREE (donations accepted) - bring proof of income or support Best way to get seen: Call and schedule an appointment OR come at 8am on the 1st Monday of every month (except for holidays) when it is first come/first served.     [facility] [phone]   Location: [place], [place] Clinic Hours: Friday mornings Services, Payment Options, Best way to get seen: Call for info

## 2019-01-18 NOTE — ED Notes (Signed)
See triage note  Presents with dental pain   States she developed a hole on tooth couple of days ago   Having increased pain this am

## 2019-01-18 NOTE — ED Triage Notes (Signed)
Says hole in tooth. Pain.

## 2019-01-18 NOTE — ED Provider Notes (Signed)
[facility] Emergency Department Provider Note  ____________________________________________   First MD Initiated Contact with Patient 01/18/19 1211     (approximate)  I have reviewed the triage vital signs and the nursing notes.   HISTORY  Chief Complaint Dental Pain   HPI Madison Singleton is a 28 y.o. female presents to the ED with complaint of dental pain.  Patient states that her tooth been hurting for several days and that she has noticed a hole in her tooth.  She denies any fever or chills.  There has been no cough, congestion, nausea or vomiting.  Patient denies any Covid exposure.  He rates her pain as 9 out of 10.     Past Medical History:  Diagnosis Date  . Anxiety    NO MEDS  . Breast mass   . Breast tenderness in female    3;00 x3 weeks  . Depression    NO MEDS  . GERD (gastroesophageal reflux disease)    OCC-NO MEDS  . Headache    HAS RECENTLY STARTED HAVING HA'S MORE FREQUENTLY    Patient Active Problem List   Diagnosis Date Noted  . Supervision of high risk pregnancy in third trimester 03/25/2018  . Pelvic pressure in pregnancy, antepartum, third trimester 02/26/2018  . Generalized anxiety disorder 07/11/2016  . Cocaine abuse (HCC) 07/11/2016    Past Surgical History:  Procedure Laterality Date  . KNEE ARTHROSCOPY WITH MEDIAL MENISECTOMY Right 09/07/2015   Procedure: Arthroscopic Lateral Release ;  Surgeon: Kennedy Bucker, MD;  Location: [facility] ORS;  Service: Orthopedics;  Laterality: Right;    Prior to Admission medications   Medication Sig Start Date End Date Taking? Authorizing Provider  escitalopram (LEXAPRO) 20 MG tablet Take 20 mg by mouth daily.   Yes [provider]  clindamycin (CLEOCIN) 150 MG capsule 2 caps tid 01/18/19   Bridget Hartshorn L, PA-C  ibuprofen (ADVIL) 600 MG tablet Take 1 tablet (600 mg total) by mouth every 8 (eight) hours as needed. 01/18/19   Tommi Rumps, PA-C  QUEtiapine (SEROQUEL) 100  MG tablet Take by mouth at bedtime.    [provider]    Allergies Haldol [haloperidol lactate] and Amoxicillin  Family History  Problem Relation Age of Onset  . Breast cancer Cousin     Social History Social History   Tobacco Use  . Smoking status: Current Every Day Smoker    Packs/day: 0.10    Years: 14.00    Pack years: 1.40    Types: Cigarettes  . Smokeless tobacco: Never Used  Substance Use Topics  . Alcohol use: Yes    Alcohol/week: 1.0 standard drinks    Types: 1 Cans of beer per week    Comment: SOCIAL  . Drug use: Not Currently    Types: Cocaine    Review of Systems Constitutional: No fever/chills ENT: Positive for dental pain. Cardiovascular: Denies chest pain. Respiratory: Denies shortness of breath. Gastrointestinal: No abdominal pain.  No nausea, no vomiting.  No diarrhea.  No constipation. Genitourinary: Negative for dysuria. Musculoskeletal: Negative for muscle aches. Skin: Negative for rash. Neurological: Negative for headaches, focal weakness or numbness. ___________________________________________   PHYSICAL EXAM:  VITAL SIGNS: ED Triage Vitals  Enc Vitals Group     BP 01/18/19 1152 130/76     Pulse Rate 01/18/19 1152 71     Resp 01/18/19 1152 20     Temp 01/18/19 1152 98.4 F (36.9 C)     Temp Source 01/18/19 1152 Oral  SpO2 01/18/19 1152 99 %     Weight 01/18/19 1152 175 lb (79.4 kg)     Height 01/18/19 1152 5\' 1"  (1.549 m)     Head Circumference --      Peak Flow --      Pain Score 01/18/19 1157 9     Pain Loc --      Pain Edu? --      Excl. in White? --     Constitutional: Alert and oriented. Well appearing and in no acute distress. Eyes: Conjunctivae are normal.  Head: Atraumatic. Nose: No congestion/rhinnorhea. Mouth/Throat: Mucous membranes are moist.  Oropharynx non-erythematous.  Right lower molar has a large cavity.  Gum surrounding this tooth is tender.  No actual abscess or drainage is noted. Neck: No  stridor.   Hematological/Lymphatic/Immunilogical: No cervical lymphadenopathy. Cardiovascular: Normal rate, regular rhythm. Grossly normal heart sounds.  Good peripheral circulation. Respiratory: Normal respiratory effort.  No retractions. Lungs CTAB. Neurologic:  Normal speech and language. No gross focal neurologic deficits are appreciated. No gait instability. Skin:  Skin is warm, dry and intact. No rash noted. Psychiatric: Mood and affect are normal. Speech and behavior are normal.  ____________________________________________   LABS (all labs ordered are listed, but only abnormal results are displayed)  Labs Reviewed - No data to display  PROCEDURES  Procedure(s) performed (including Critical Care):  Procedures   ____________________________________________   INITIAL IMPRESSION / ASSESSMENT AND PLAN / ED COURSE  As part of my medical decision making, I reviewed the following data within the electronic MEDICAL RECORD NUMBER Notes from prior ED visits and [place] Controlled Substance Database  28 year old female presents to the ED with complaint of right lower dental pain for several days.  Patient does not have a dentist.  She is here to get antibiotics and requested pain medication while in the ED.  Patient was given a list of dental clinics.  She was given tramadol p.o. while in the ED.  She was discharged with prescription for clindamycin and ibuprofen.  ____________________________________________   FINAL CLINICAL IMPRESSION(S) / ED DIAGNOSES  Final diagnoses:  Pain, dental     ED Discharge Orders         Ordered    clindamycin (CLEOCIN) 150 MG capsule     01/18/19 1249    ibuprofen (ADVIL) 600 MG tablet  Every 8 hours PRN,   Status:  Discontinued     01/18/19 1249    ibuprofen (ADVIL) 600 MG tablet  Every 8 hours PRN     01/18/19 1250           Note:  This document was prepared using Dragon voice recognition software and may include unintentional dictation  errors.    Johnn Hai, PA-C 01/18/19 1441    Delman Kitten, MD 01/18/19 [id number]

## 2019-02-05 ENCOUNTER — Emergency Department
Admission: EM | Admit: 2019-02-05 | Discharge: 2019-02-05 | Disposition: A | Discharge status: Home / Self Care | Payer: Medicaid Other | Attending: Emergency Medicine | Admitting: Emergency Medicine

## 2019-02-05 ENCOUNTER — Emergency Department: Payer: Medicaid Other

## 2019-02-05 ENCOUNTER — Other Ambulatory Visit: Payer: Self-pay

## 2019-02-05 ENCOUNTER — Encounter: Payer: Self-pay | Admitting: Emergency Medicine

## 2019-02-05 DIAGNOSIS — Y92002 Bathroom of unspecified non-institutional (private) residence single-family (private) house as the place of occurrence of the external cause: Secondary | ICD-10-CM | POA: Insufficient documentation

## 2019-02-05 DIAGNOSIS — F17210 Nicotine dependence, cigarettes, uncomplicated: Secondary | ICD-10-CM | POA: Diagnosis not present

## 2019-02-05 DIAGNOSIS — Z79899 Other long term (current) drug therapy: Secondary | ICD-10-CM | POA: Insufficient documentation

## 2019-02-05 DIAGNOSIS — Y999 Unspecified external cause status: Secondary | ICD-10-CM | POA: Insufficient documentation

## 2019-02-05 DIAGNOSIS — Y9389 Activity, other specified: Secondary | ICD-10-CM | POA: Diagnosis not present

## 2019-02-05 DIAGNOSIS — S0081XA Abrasion of other part of head, initial encounter: Secondary | ICD-10-CM

## 2019-02-05 DIAGNOSIS — S0993XA Unspecified injury of face, initial encounter: Secondary | ICD-10-CM | POA: Diagnosis not present

## 2019-02-05 DIAGNOSIS — W182XXA Fall in (into) shower or empty bathtub, initial encounter: Secondary | ICD-10-CM | POA: Diagnosis not present

## 2019-02-05 DIAGNOSIS — S0083XA Contusion of other part of head, initial encounter: Secondary | ICD-10-CM

## 2019-02-05 DIAGNOSIS — S82851A Displaced trimalleolar fracture of right lower leg, initial encounter for closed fracture: Secondary | ICD-10-CM | POA: Diagnosis not present

## 2019-02-05 DIAGNOSIS — S99911A Unspecified injury of right ankle, initial encounter: Secondary | ICD-10-CM | POA: Diagnosis present

## 2019-02-05 MED ORDER — OXYCODONE-ACETAMINOPHEN 5-325 MG PO TABS: 1.0000 | ORAL_TABLET | Freq: Three times a day (TID) | ORAL | 0 refills | Status: DC | PRN

## 2019-02-05 MED ORDER — OXYCODONE HCL 5 MG PO TABS
5.0000 mg | ORAL_TABLET | Freq: Once | ORAL | Status: AC
  Administered 2019-02-05: 5 mg via ORAL
  Filled 2019-02-05: qty 1

## 2019-02-05 MED ORDER — ACETAMINOPHEN 325 MG PO TABS
650.0000 mg | ORAL_TABLET | Freq: Once | ORAL | Status: AC
  Administered 2019-02-05: 650 mg via ORAL
  Filled 2019-02-05: qty 2

## 2019-02-05 MED ORDER — CYCLOBENZAPRINE HCL 5 MG PO TABS: 5.0000 mg | ORAL_TABLET | Freq: Three times a day (TID) | ORAL | 0 refills | Status: DC | PRN

## 2019-02-05 MED ORDER — DIAZEPAM 2 MG PO TABS
2.0000 mg | ORAL_TABLET | Freq: Once | ORAL | Status: AC
  Administered 2019-02-05: 2 mg via ORAL
  Filled 2019-02-05: qty 1

## 2019-02-05 NOTE — ED Notes (Signed)
X-ray at bedside
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
## 2019-02-05 NOTE — ED Provider Notes (Addendum)
[facility] Emergency Department Provider Note ____________________________________________  Time seen: 1110  I have reviewed the triage vital signs and the nursing notes.  HISTORY  Chief Complaint  Fall and Ankle Pain  HPI Madison Singleton is a 29 y.o. female presents to the ED  via EMS from home, for evaluation of injury sustained following a mechanical fall.  Patient describes she was at home, and under the influence of alcohol in the early hours of the morning, when she got up to use the bathroom.  She admits to at least 2 falls one while in the shower, and a second fall while walking to the house.  She complains of pain to the face at the right forehead, as well as pain and disability to the right ankle.  She denies any chest pain, shortness of breath, nausea, vomiting, or dizziness.   Past Medical History:  Diagnosis Date  . Anxiety    NO MEDS  . Breast mass   . Breast tenderness in female    3;00 x3 weeks  . Depression    NO MEDS  . GERD (gastroesophageal reflux disease)    OCC-NO MEDS  . Headache    HAS RECENTLY STARTED HAVING HA'S MORE FREQUENTLY    Patient Active Problem List   Diagnosis Date Noted  . Supervision of high risk pregnancy in third trimester 03/25/2018  . Pelvic pressure in pregnancy, antepartum, third trimester 02/26/2018  . Generalized anxiety disorder 07/11/2016  . Cocaine abuse ([place]) 07/11/2016    Past Surgical History:  Procedure Laterality Date  . KNEE ARTHROSCOPY WITH MEDIAL MENISECTOMY Right 09/07/2015   Procedure: Arthroscopic Lateral Release ;  Surgeon: Hessie Knows, MD;  Location: [facility] ORS;  Service: Orthopedics;  Laterality: Right;    Prior to Admission medications   Medication Sig Start Date End Date Taking? Authorizing Provider  clindamycin (CLEOCIN) 150 MG capsule 2 caps tid 01/18/19   Letitia Neri L, PA-C  cyclobenzaprine (FLEXERIL) 5 MG tablet Take 1 tablet (5 mg total) by mouth 3 (three) times daily as  needed. 02/05/19   Philomene Haff, Dannielle Karvonen, PA-C  escitalopram (LEXAPRO) 20 MG tablet Take 20 mg by mouth daily.    [provider]  ibuprofen (ADVIL) 600 MG tablet Take 1 tablet (600 mg total) by mouth every 8 (eight) hours as needed. 01/18/19   Johnn Hai, PA-C  oxyCODONE-acetaminophen (PERCOCET) 5-325 MG tablet Take 1 tablet by mouth 3 (three) times daily as needed for up to 7 days for severe pain. 02/05/19 02/12/19  Kathye Cipriani, Dannielle Karvonen, PA-C  QUEtiapine (SEROQUEL) 100 MG tablet Take by mouth at bedtime.    [provider]    Allergies Haldol [haloperidol lactate] and Amoxicillin  Family History  Problem Relation Age of Onset  . Breast cancer Cousin     Social History Social History   Tobacco Use  . Smoking status: Current Every Day Smoker    Packs/day: 0.10    Years: 14.00    Pack years: 1.40    Types: Cigarettes  . Smokeless tobacco: Never Used  Substance Use Topics  . Alcohol use: Yes    Alcohol/week: 1.0 standard drinks    Types: 1 Cans of beer per week    Comment: SOCIAL  . Drug use: Not Currently    Types: Cocaine    Review of Systems  Constitutional: Negative for fever. Eyes: Negative for visual changes. ENT: Negative for sore throat. Cardiovascular: Negative for chest pain. Respiratory: Negative for shortness of breath.  Gastrointestinal: Negative for abdominal pain, vomiting and diarrhea. Genitourinary: Negative for dysuria. Musculoskeletal: Negative for back pain. Right ankle pain as above Skin: Negative for rash. Forehead abrasion Neurological: Negative for headaches, focal weakness or numbness. ____________________________________________  PHYSICAL EXAM:  VITAL SIGNS: ED Triage Vitals [02/05/19 1103]  Enc Vitals Group     BP 122/67     Pulse Rate 77     Resp 18     Temp 98.1 F (36.7 C)     Temp Source Oral     SpO2 98 %     Weight 180 lb (81.6 kg)     Height 5\' 1"  (1.549 m)     Head Circumference      Peak Flow       Pain Score 10     Pain Loc      Pain Edu?      Excl. in GC?     Constitutional: Alert and oriented. Well appearing and in no distress. GCS = 15 Head: Normocephalic and atraumatic, except for an abrasion to the forehead. Eyes: Conjunctivae are normal. PERRL. Normal extraocular movements Neck: Supple. Normal ROM without crepitus  Cardiovascular: Normal rate, regular rhythm. Normal distal pulses. Respiratory: Normal respiratory effort. No wheezes/rales/rhonchi. Gastrointestinal: Soft and nontender. No distention. Musculoskeletal: Right ankle with soft tissue swelling to the medial lateral aspects.  No obvious deformity or dislocation is appreciated.  Nontender with normal range of motion in all extremities.  Neurologic: Normal speech and language. No gross focal neurologic deficits are appreciated. Skin:  Skin is warm, dry and intact. No rash noted. Psychiatric: Mood and affect are normal. Patient exhibits appropriate insight and judgment. ____________________________________________   RADIOLOGY  CT Head IMPRESSION: Normal head CT.  DG Right Ankle IMPRESSION: Fracture subluxation at the ankle. Apparent posterior lip distal tibia fracture on the lateral views suggest trimalleolar injury.  DG Right Ankle 2V IMPRESSION: Reduction of trimalleolar ankle fracture. Improved alignment ankle mortise.  I, , personally viewed and evaluated these images (plain radiographs) as part of my medical decision making, as well as reviewing the written report by the radiologist. ____________________________________________  PROCEDURES  Tylenol 650 mg PO Oxycodone IR 5 mg PO Valium 2 mg PO Standard Walker  .Splint Application  Date/Time: 02/05/2019 12:25 PM Performed by: 02/07/2019, PA-C Authorized by: Lissa Hoard, PA-C   Consent:    Consent obtained:  Verbal   Consent given by:  Patient   Risks discussed:  Pain Pre-procedure details:     Sensation:  Normal Procedure details:    Laterality:  Right   Location:  Ankle   Ankle:  R ankle   Splint type:  Short leg   Supplies:  Elastic bandage, cotton padding and Ortho-Glass Post-procedure details:    Pain:  Improved   Sensation:  Normal   Patient tolerance of procedure:  Tolerated well, no immediate complications  Reduction of dislocation  Date/Time: 02/05/2019 12:43 PM Performed by: 02/07/2019, PA-C Authorized by: Lissa Hoard, PA-C  Consent: Verbal consent obtained. Risks and benefits: risks, benefits and alternatives were discussed Consent given by: patient Patient understanding: patient states understanding of the procedure being performed Patient consent: the patient's understanding of the procedure matches consent given Site marked: the operative site was marked Imaging studies: imaging studies available Patient identity confirmed: verbally with patient Local anesthesia used: no  Anesthesia: Local anesthesia used: no  Sedation: Patient sedated: no  Patient tolerance: patient tolerated the procedure well  with no immediate complications Comments: Manual reduction of right ankle trimalleolar FX.    ____________________________________________  INITIAL IMPRESSION / ASSESSMENT AND PLAN / ED COURSE  ----------------------------------------- 12:21 PM on 02/05/2019 -----------------------------------------  S/w Dr. Loralie Champagneurrani: he suggests reduction and splinting in double sugar-tong splint. He would like her to follow-up with Dr. Odis LusterBowers in the office next week for surgery consultation.   Presents to the ED for initial fracture management of a subluxed trimalleolar fracture on the right.  Patient sustained a mechanical fall in the face of EtOH, early this morning.  She sustained facial abrasions, in addition to the ankle injury.  CT of the head was negative for any acute intracranial process.  X-ray confirmed the fracture morphology.   Patient was reduced and splinted in the ED as appropriate.  She is discharged to follow-up with Dr. Odis LusterBowers in the clinic next week for surgical consultation.  She verbalizes understanding of discharge instructions.  Work is provided relieving her of her work activities until she is cleared by Ortho.  Madison Singleton was evaluated in Emergency Department on 02/05/2019 for the symptoms described in the history of present illness. She was evaluated in the context of the global COVID-19 pandemic, which necessitated consideration that the patient might be at risk for infection with the SARS-CoV-2 virus that causes COVID-19. Institutional protocols and algorithms that pertain to the evaluation of patients at risk for COVID-19 are in a state of rapid change based on information released by regulatory bodies including the CDC and federal and state organizations. These policies and algorithms were followed during the patient's care in the ED. ____________________________________________  FINAL CLINICAL IMPRESSION(S) / ED DIAGNOSES  Final diagnoses:  Trimalleolar fracture of ankle, closed, right, initial encounter  Facial contusion, initial encounter  Facial abrasion, initial encounter      Lissa HoardMenshew, Willys Salvino V Bacon, PA-C 02/05/19 1350    Zeffie Bickert, Charlesetta IvoryJenise V Bacon, PA-C 02/05/19 1351    Sharyn CreamerQuale, Mark, MD 02/05/19 1743

## 2019-02-08 ENCOUNTER — Other Ambulatory Visit: Payer: Self-pay | Admitting: Orthopedic Surgery

## 2019-02-08 DIAGNOSIS — S82851A Displaced trimalleolar fracture of right lower leg, initial encounter for closed fracture: Secondary | ICD-10-CM | POA: Insufficient documentation

## 2019-02-09 ENCOUNTER — Other Ambulatory Visit: Payer: Self-pay

## 2019-02-09 ENCOUNTER — Other Ambulatory Visit
Admission: RE | Admit: 2019-02-09 | Discharge: 2019-02-09 | Disposition: A | Discharge status: Home / Self Care | Payer: Medicaid Other | Source: Ambulatory Visit | Attending: Orthopedic Surgery | Admitting: Orthopedic Surgery

## 2019-02-09 DIAGNOSIS — Z20828 Contact with and (suspected) exposure to other viral communicable diseases: Secondary | ICD-10-CM | POA: Insufficient documentation

## 2019-02-09 DIAGNOSIS — Z01812 Encounter for preprocedural laboratory examination: Secondary | ICD-10-CM | POA: Insufficient documentation

## 2019-02-09 LAB — SARS CORONAVIRUS 2 (TAT 6-24 HRS): SARS Coronavirus 2: NEGATIVE

## 2019-02-09 MED ORDER — CLINDAMYCIN PHOSPHATE 900 MG/50ML IV SOLN
900.0000 mg | INTRAVENOUS | Status: DC
  Administered 2019-02-16: 900 mg via INTRAVENOUS

## 2019-02-10 ENCOUNTER — Encounter: Payer: Self-pay | Admitting: Certified Registered Nurse Anesthetist

## 2019-02-10 ENCOUNTER — Other Ambulatory Visit: Payer: Self-pay

## 2019-02-10 ENCOUNTER — Encounter
Admission: RE | Disposition: A | Discharge status: Home / Self Care | Payer: Self-pay | Source: Home / Self Care | Attending: Orthopedic Surgery

## 2019-02-10 ENCOUNTER — Ambulatory Visit
Admission: RE | Admit: 2019-02-10 | Discharge: 2019-02-10 | Disposition: A | Discharge status: Home / Self Care | Payer: Medicaid Other | Attending: Orthopedic Surgery | Admitting: Orthopedic Surgery

## 2019-02-10 DIAGNOSIS — Z5309 Procedure and treatment not carried out because of other contraindication: Secondary | ICD-10-CM | POA: Diagnosis not present

## 2019-02-10 DIAGNOSIS — S82851A Displaced trimalleolar fracture of right lower leg, initial encounter for closed fracture: Secondary | ICD-10-CM | POA: Insufficient documentation

## 2019-02-10 DIAGNOSIS — W19XXXA Unspecified fall, initial encounter: Secondary | ICD-10-CM | POA: Diagnosis not present

## 2019-02-10 LAB — BASIC METABOLIC PANEL
Anion gap: 9 (ref 5–15)
BUN: 10 mg/dL (ref 6–20)
CO2: 25 mmol/L (ref 22–32)
Calcium: 8.7 mg/dL — ABNORMAL LOW (ref 8.9–10.3)
Chloride: 102 mmol/L (ref 98–111)
Creatinine, Ser: 0.65 mg/dL (ref 0.44–1.00)
GFR calc Af Amer: >60 mL/min (ref >60)
GFR calc non Af Amer: >60 mL/min (ref >60)
Glucose, Bld: 95 mg/dL (ref 70–99)
Potassium: 4.1 mmol/L (ref 3.5–5.1)
Sodium: 136 mmol/L (ref 135–145)

## 2019-02-10 LAB — URINE DRUG SCREEN, QUALITATIVE (ARMC ONLY)
Amphetamines, Ur Screen: NOT DETECTED
Barbiturates, Ur Screen: NOT DETECTED
Benzodiazepine, Ur Scrn: POSITIVE — AB
Cannabinoid 50 Ng, Ur ~~LOC~~: NOT DETECTED
Cocaine Metabolite,Ur ~~LOC~~: POSITIVE — AB
MDMA (Ecstasy)Ur Screen: NOT DETECTED
Methadone Scn, Ur: NOT DETECTED
Opiate, Ur Screen: POSITIVE — AB
Phencyclidine (PCP) Ur S: NOT DETECTED
Tricyclic, Ur Screen: POSITIVE — AB

## 2019-02-10 LAB — CBC
HCT: 32.6 % — ABNORMAL LOW (ref 36.0–46.0)
Hemoglobin: 10.2 g/dL — ABNORMAL LOW (ref 12.0–15.0)
MCH: 24.5 pg — ABNORMAL LOW (ref 26.0–34.0)
MCHC: 31.3 g/dL (ref 30.0–36.0)
MCV: 78.2 fL — ABNORMAL LOW (ref 80.0–100.0)
Platelets: 460 10*3/uL — ABNORMAL HIGH (ref 150–400)
RBC: 4.17 MIL/uL (ref 3.87–5.11)
RDW: 15.6 % — ABNORMAL HIGH (ref 11.5–15.5)
WBC: 5.7 10*3/uL (ref 4.0–10.5)
nRBC: 0 % (ref 0.0–0.2)

## 2019-02-10 LAB — URINALYSIS, ROUTINE W REFLEX MICROSCOPIC
Bilirubin Urine: NEGATIVE
Glucose, UA: NEGATIVE mg/dL
Hgb urine dipstick: NEGATIVE
Ketones, ur: NEGATIVE mg/dL
Nitrite: NEGATIVE
Protein, ur: NEGATIVE mg/dL
Specific Gravity, Urine: 1.019 (ref 1.005–1.030)
pH: 6 (ref 5.0–8.0)

## 2019-02-10 LAB — PROTIME-INR
INR: 0.9 (ref 0.8–1.2)
Prothrombin Time: 12.2 seconds (ref 11.4–15.2)

## 2019-02-10 LAB — APTT: aPTT: 31 seconds (ref 24–36)

## 2019-02-10 SURGERY — OPEN REDUCTION INTERNAL FIXATION (ORIF) ANKLE FRACTURE
Anesthesia: General | Site: Ankle | Laterality: Right

## 2019-02-10 MED ORDER — DEXAMETHASONE SODIUM PHOSPHATE 10 MG/ML IJ SOLN
INTRAMUSCULAR | Status: AC
  Filled 2019-02-10: qty 1

## 2019-02-10 MED ORDER — LACTATED RINGERS IV SOLN: INTRAVENOUS | Status: DC

## 2019-02-10 MED ORDER — MIDAZOLAM HCL 2 MG/2ML IJ SOLN
INTRAMUSCULAR | Status: AC
  Filled 2019-02-10: qty 2

## 2019-02-10 MED ORDER — LIDOCAINE HCL (PF) 2 % IJ SOLN
INTRAMUSCULAR | Status: AC
  Filled 2019-02-10: qty 10

## 2019-02-10 MED ORDER — ONDANSETRON HCL 4 MG/2ML IJ SOLN
INTRAMUSCULAR | Status: AC
  Filled 2019-02-10: qty 2

## 2019-02-10 MED ORDER — LACTATED RINGERS IV SOLN
Freq: Once | INTRAVENOUS | Status: DC
  Administered 2019-02-16: 11:00:00 via INTRAVENOUS

## 2019-02-10 MED ORDER — ACETAMINOPHEN 500 MG PO TABS: 1000.0000 mg | ORAL_TABLET | Freq: Once | ORAL | Status: DC

## 2019-02-10 MED ORDER — CHLORHEXIDINE GLUCONATE 4 % EX LIQD: 60.0000 mL | Freq: Once | CUTANEOUS | Status: DC

## 2019-02-10 SURGICAL SUPPLY — 41 items
BNDG COHESIVE 6X5 TAN STRL LF (GAUZE/BANDAGES/DRESSINGS) ×3 IMPLANT
BNDG ELASTIC 6X5.8 VLCR STR LF (GAUZE/BANDAGES/DRESSINGS) ×3 IMPLANT
BNDG ESMARK 6X12 TAN STRL LF (GAUZE/BANDAGES/DRESSINGS) ×3 IMPLANT
BRUSH SCRUB EZ  4% CHG (MISCELLANEOUS) ×4
BRUSH SCRUB EZ 4% CHG (MISCELLANEOUS) ×2 IMPLANT
CANISTER SUCT 1200ML W/VALVE (MISCELLANEOUS) ×3 IMPLANT
CHLORAPREP W/TINT 26 (MISCELLANEOUS) ×6 IMPLANT
COVER WAND RF STERILE (DRAPES) ×3 IMPLANT
CUFF TOURN SGL QUICK 24 (TOURNIQUET CUFF)
CUFF TOURN SGL QUICK 30 (TOURNIQUET CUFF)
CUFF TRNQT CYL 24X4X16.5-23 (TOURNIQUET CUFF) IMPLANT
CUFF TRNQT CYL 30X4X21-28X (TOURNIQUET CUFF) IMPLANT
DRAPE 3/4 80X56 (DRAPES) ×3 IMPLANT
DRAPE FLUOR MINI C-ARM 54X84 (DRAPES) ×3 IMPLANT
ELECT REM PT RETURN 9FT ADLT (ELECTROSURGICAL) ×3
ELECTRODE REM PT RTRN 9FT ADLT (ELECTROSURGICAL) ×1 IMPLANT
GAUZE XEROFORM 1X8 LF (GAUZE/BANDAGES/DRESSINGS) ×6 IMPLANT
GLOVE INDICATOR 8.0 STRL GRN (GLOVE) ×9 IMPLANT
GLOVE SURG ORTHO 8.0 STRL STRW (GLOVE) ×9 IMPLANT
GOWN STRL REUS W/ TWL LRG LVL3 (GOWN DISPOSABLE) ×1 IMPLANT
GOWN STRL REUS W/ TWL XL LVL3 (GOWN DISPOSABLE) ×1 IMPLANT
GOWN STRL REUS W/TWL LRG LVL3 (GOWN DISPOSABLE) ×2
GOWN STRL REUS W/TWL XL LVL3 (GOWN DISPOSABLE) ×2
KIT TURNOVER KIT A (KITS) ×3 IMPLANT
NS IRRIG 1000ML POUR BTL (IV SOLUTION) ×3 IMPLANT
PACK EXTREMITY ARMC (MISCELLANEOUS) ×3 IMPLANT
PAD ABD DERMACEA PRESS 5X9 (GAUZE/BANDAGES/DRESSINGS) ×12 IMPLANT
PAD CAST CTTN 4X4 STRL (SOFTGOODS) ×2 IMPLANT
PADDING CAST COTTON 4X4 STRL (SOFTGOODS) ×4
SCALPEL PROTECTED #10 DISP (BLADE) ×3 IMPLANT
SCALPEL PROTECTED #15 DISP (BLADE) ×3 IMPLANT
SPLINT CAST 1 STEP 5X30 WHT (MISCELLANEOUS) ×3 IMPLANT
SPONGE LAP 18X18 RF (DISPOSABLE) ×3 IMPLANT
STAPLER SKIN PROX 35W (STAPLE) ×3 IMPLANT
SUT VIC AB 2-0 SH 27 (SUTURE) ×2
SUT VIC AB 2-0 SH 27XBRD (SUTURE) ×1 IMPLANT
SUT VIC AB 3-0 SH 27 (SUTURE) ×2
SUT VIC AB 3-0 SH 27X BRD (SUTURE) ×1 IMPLANT
TAPE SURG TRANSPORE 1 IN (GAUZE/BANDAGES/DRESSINGS) ×1 IMPLANT
TAPE SURGICAL TRANSPORE 1 IN (GAUZE/BANDAGES/DRESSINGS) ×2
TOWEL OR 17X26 4PK STRL BLUE (TOWEL DISPOSABLE) ×6 IMPLANT

## 2019-02-10 NOTE — OR Nursing (Signed)
Urine drug positive. Dr. Kayleen Memos notified. He has spoken with Dr. Alvira Philips who is in surgery at this time. Dr. Alvira Philips has cancelled this patient's surgery for today. Patient instructed to call his office for a reschedule.

## 2019-02-10 NOTE — Anesthesia Preprocedure Evaluation (Addendum)
Anesthesia Evaluation  Patient identified by MRN, date of birth, ID band Patient awake    Reviewed: Allergy & Precautions, H&P , NPO status , Patient's Chart, lab work & pertinent test results  History of Anesthesia Complications Negative for: history of anesthetic complications  Airway Mallampati: II  TM Distance: >3 FB Neck ROM: full    Dental  (+) Chipped   Pulmonary neg COPD, neg recent URI, Current SmokerPatient did not abstain from smoking.,           Cardiovascular (-) angina(-) Past MI negative cardio ROS  (-) dysrhythmias      Neuro/Psych  Headaches, PSYCHIATRIC DISORDERS Anxiety Depression    GI/Hepatic GERD  Medicated and Controlled,(+)     substance abuse  cocaine use,   Endo/Other  negative endocrine ROS  Renal/GU negative Renal ROS     Musculoskeletal   Abdominal   Peds  Hematology negative hematology ROS (+)   Anesthesia Other Findings Obesity BMI 35  Past Medical History: No date: Anxiety     Comment:  NO MEDS No date: Breast mass No date: Breast tenderness in female     Comment:  3;00 x3 weeks No date: Depression     Comment:  NO MEDS No date: GERD (gastroesophageal reflux disease)     Comment:  OCC-NO MEDS No date: Headache     Comment:  HAS RECENTLY STARTED HAVING HA'S MORE FREQUENTLY  Reproductive/Obstetrics negative OB ROS                         Anesthesia Physical  Anesthesia Plan  ASA: II  Anesthesia Plan: General ETT   Post-op Pain Management:    Induction: Intravenous  PONV Risk Score and Plan: Ondansetron, Dexamethasone, Midazolam and Treatment may vary due to age or medical condition  Airway Management Planned:   Additional Equipment:   Intra-op Plan:   Post-operative Plan:   Informed Consent: I have reviewed the patients History and Physical, chart, labs and discussed the procedure including the risks, benefits and alternatives for the  proposed anesthesia with the patient or authorized representative who has indicated his/her understanding and acceptance.     Dental Advisory Given  Plan Discussed with: Anesthesiologist  Anesthesia Plan Comments:        Anesthesia Quick Evaluation

## 2019-02-12 ENCOUNTER — Encounter: Payer: Self-pay | Admitting: Emergency Medicine

## 2019-02-12 ENCOUNTER — Other Ambulatory Visit: Payer: Self-pay

## 2019-02-12 ENCOUNTER — Other Ambulatory Visit
Admission: RE | Admit: 2019-02-12 | Discharge: 2019-02-12 | Disposition: A | Discharge status: Home / Self Care | Payer: Medicaid Other | Source: Ambulatory Visit | Attending: Orthopedic Surgery | Admitting: Orthopedic Surgery

## 2019-02-12 ENCOUNTER — Emergency Department
Admission: EM | Admit: 2019-02-12 | Discharge: 2019-02-12 | Disposition: A | Discharge status: Home / Self Care | Payer: Medicaid Other | Attending: Emergency Medicine | Admitting: Emergency Medicine

## 2019-02-12 DIAGNOSIS — X58XXXD Exposure to other specified factors, subsequent encounter: Secondary | ICD-10-CM | POA: Diagnosis not present

## 2019-02-12 DIAGNOSIS — Z79899 Other long term (current) drug therapy: Secondary | ICD-10-CM | POA: Diagnosis not present

## 2019-02-12 DIAGNOSIS — S82851D Displaced trimalleolar fracture of right lower leg, subsequent encounter for closed fracture with routine healing: Secondary | ICD-10-CM | POA: Diagnosis present

## 2019-02-12 DIAGNOSIS — F17210 Nicotine dependence, cigarettes, uncomplicated: Secondary | ICD-10-CM | POA: Insufficient documentation

## 2019-02-12 DIAGNOSIS — Z20828 Contact with and (suspected) exposure to other viral communicable diseases: Secondary | ICD-10-CM | POA: Insufficient documentation

## 2019-02-12 DIAGNOSIS — M25571 Pain in right ankle and joints of right foot: Secondary | ICD-10-CM

## 2019-02-12 LAB — SARS CORONAVIRUS 2 (TAT 6-24 HRS): SARS Coronavirus 2: NEGATIVE

## 2019-02-12 MED ORDER — OXYCODONE-ACETAMINOPHEN 5-325 MG PO TABS
1.0000 | ORAL_TABLET | Freq: Once | ORAL | Status: AC
  Administered 2019-02-12: 1 via ORAL
  Filled 2019-02-12: qty 1

## 2019-02-12 MED ORDER — OXYCODONE-ACETAMINOPHEN 5-325 MG PO TABS: 1.0000 | ORAL_TABLET | Freq: Four times a day (QID) | ORAL | 0 refills | Status: DC | PRN

## 2019-02-12 MED ORDER — OXYCODONE-ACETAMINOPHEN 5-325 MG PO TABS: 1.0000 | ORAL_TABLET | Freq: Once | ORAL | Status: DC

## 2019-02-12 NOTE — ED Provider Notes (Signed)
[facility] Emergency Department Provider Note   ____________________________________________   First MD Initiated Contact with Patient 02/12/19 [phone]     (approximate)  I have reviewed the triage vital signs and the nursing notes.   HISTORY  Chief Complaint Ankle Pain    HPI Madison Singleton is a 28 y.o. female presents for medication refill for trimalleolar fracture of the right lower extremity.  Patient to schedule surgery in 5 days.  Patient states she has run out of pain medication from previous prescription.  Reviewed prescription drug consistent with patient history.  Patient rates pain as 10/10.         Past Medical History:  Diagnosis Date  . Anxiety    NO MEDS  . Breast mass   . Breast tenderness in female    3;00 x3 weeks  . Depression    NO MEDS  . GERD (gastroesophageal reflux disease)    OCC-NO MEDS  . Headache    HAS RECENTLY STARTED HAVING HA'S MORE FREQUENTLY    Patient Active Problem List   Diagnosis Date Noted  . Supervision of high risk pregnancy in third trimester 03/25/2018  . Pelvic pressure in pregnancy, antepartum, third trimester 02/26/2018  . Generalized anxiety disorder 07/11/2016  . Cocaine abuse ([place]) 07/11/2016    Past Surgical History:  Procedure Laterality Date  . KNEE ARTHROSCOPY WITH MEDIAL MENISECTOMY Right 09/07/2015   Procedure: Arthroscopic Lateral Release ;  Surgeon: Hessie Knows, MD;  Location: [facility] ORS;  Service: Orthopedics;  Laterality: Right;  . MOUTH SURGERY  12/2018    Prior to Admission medications   Medication Sig Start Date End Date Taking? Authorizing Provider  clindamycin (CLEOCIN) 150 MG capsule 2 caps tid Patient not taking: Reported on 02/08/2019 01/18/19   Johnn Hai, PA-C  cyclobenzaprine (FLEXERIL) 5 MG tablet Take 1 tablet (5 mg total) by mouth 3 (three) times daily as needed. Patient taking differently: Take 5 mg by mouth 3 (three) times daily as needed for muscle  spasms.  02/05/19   Menshew, Dannielle Karvonen, PA-C  ibuprofen (ADVIL) 600 MG tablet Take 1 tablet (600 mg total) by mouth every 8 (eight) hours as needed. Patient not taking: Reported on 02/08/2019 01/18/19   Johnn Hai, PA-C  oxyCODONE-acetaminophen (PERCOCET) 5-325 MG tablet Take 1 tablet by mouth every 6 (six) hours as needed for up to 5 days for severe pain. 02/12/19 02/17/19  Sable Feil, PA-C  QUEtiapine (SEROQUEL) 100 MG tablet Take by mouth at bedtime.    [provider]    Allergies Haldol [haloperidol lactate] and Amoxicillin  Family History  Problem Relation Age of Onset  . Breast cancer Cousin     Social History Social History   Tobacco Use  . Smoking status: Current Every Day Smoker    Packs/day: 0.10    Years: 14.00    Pack years: 1.40    Types: Cigarettes  . Smokeless tobacco: Never Used  Substance Use Topics  . Alcohol use: Yes    Alcohol/week: 1.0 standard drinks    Types: 1 Cans of beer per week    Comment: SOCIAL  . Drug use: Not Currently    Types: Cocaine    Review of Systems Constitutional: No fever/chills Eyes: No visual changes. ENT: No sore throat. Cardiovascular: Denies chest pain. Respiratory: Denies shortness of breath. Gastrointestinal: No abdominal pain.  No nausea, no vomiting.  No diarrhea.  No constipation. Genitourinary: Negative for dysuria. Musculoskeletal: Right ankle pain. Skin: Negative  for rash. Neurological: Negative for headaches, focal weakness or numbness.  Allergic/Immunilogical: Haldol and penicillin. ____________________________________________   PHYSICAL EXAM:  VITAL SIGNS: ED Triage Vitals  Enc Vitals Group     BP 02/12/19 0927 110/72     Pulse Rate 02/12/19 0927 90     Resp 02/12/19 0927 17     Temp 02/12/19 0927 99.3 F (37.4 C)     Temp Source 02/12/19 0927 Oral     SpO2 02/12/19 0927 99 %     Weight 02/12/19 0928 185 lb (83.9 kg)     Height 02/12/19 0928 5\' 1"  (1.549 m)     Head  Circumference --      Peak Flow --      Pain Score 02/12/19 0928 10     Pain Loc --      Pain Edu? --      Excl. in GC? --     Constitutional: Alert and oriented. Well appearing and in no acute distress. Cardiovascular: Normal rate, regular rhythm. Grossly normal heart sounds.  Good peripheral circulation. Respiratory: Normal respiratory effort.  No retractions. Lungs CTAB. Musculoskeletal: Patient wearing posterior splint right lower extremity.   Neurologic:  Normal speech and language. No gross focal neurologic deficits are appreciated. No gait instability. Skin:  Skin is warm, dry and intact. No rash noted. Psychiatric: Mood and affect are normal. Speech and behavior are normal.  ____________________________________________   LABS (all labs ordered are listed, but only abnormal results are displayed)  Labs Reviewed - No data to display ____________________________________________  EKG   ____________________________________________  RADIOLOGY  ED MD interpretation:    Official radiology report(s): No results found.  ____________________________________________   PROCEDURES  Procedure(s) performed (including Critical Care):  Procedures   ____________________________________________   INITIAL IMPRESSION / ASSESSMENT AND PLAN / ED COURSE  As part of my medical decision making, I reviewed the following data within the electronic MEDICAL RECORD NUMBER      Patient presents for medication refill pending surgery in 5 days.  Patient has a trimalleolar fracture of the right lower extremity.  Patient ran out of her narcotic pain medication yesterday.  Review of prescription log verifies patient complaint.  Patient given a prescription for Percocet and advised to follow-up with surgery as scheduled.   Madison Singleton was evaluated in Emergency Department on 02/12/2019 for the symptoms described in the history of present illness. She was evaluated in the context of the global  COVID-19 pandemic, which necessitated consideration that the patient might be at risk for infection with the SARS-CoV-2 virus that causes COVID-19. Institutional protocols and algorithms that pertain to the evaluation of patients at risk for COVID-19 are in a state of rapid change based on information released by regulatory bodies including the CDC and federal and state organizations. These policies and algorithms were followed during the patient's care in the ED.        ____________________________________________   FINAL CLINICAL IMPRESSION(S) / ED DIAGNOSES  Final diagnoses:  Acute right ankle pain     ED Discharge Orders         Ordered    oxyCODONE-acetaminophen (PERCOCET) 5-325 MG tablet  Every 6 hours PRN     02/12/19 1015           Note:  This document was prepared using Dragon voice recognition software and may include unintentional dictation errors.    02/14/19, PA-C 02/12/19 1021    02/14/19, MD 02/12/19 1028

## 2019-02-12 NOTE — Discharge Summary (Signed)
Patient was discharge after her surgery was cancelled by anesthesia for a positive drug test.

## 2019-02-12 NOTE — ED Notes (Signed)
See triage note  Presents requesting pain med's  States he is scheduled for surgery next Tuesday

## 2019-02-12 NOTE — ED Triage Notes (Signed)
Pt reports that she fractured her right ankle last week, and is scheduled to have surgery this week. States that she is out of pain medication.

## 2019-02-14 ENCOUNTER — Other Ambulatory Visit: Payer: Self-pay | Admitting: Orthopedic Surgery

## 2019-02-16 ENCOUNTER — Ambulatory Visit: Payer: Medicaid Other | Admitting: Certified Registered Nurse Anesthetist

## 2019-02-16 ENCOUNTER — Encounter
Admission: RE | Disposition: A | Discharge status: Home / Self Care | Payer: Self-pay | Source: Home / Self Care | Attending: Orthopedic Surgery

## 2019-02-16 ENCOUNTER — Ambulatory Visit
Admission: RE | Admit: 2019-02-16 | Discharge: 2019-02-16 | Disposition: A | Discharge status: Home / Self Care | Payer: Medicaid Other | Attending: Orthopedic Surgery | Admitting: Orthopedic Surgery

## 2019-02-16 ENCOUNTER — Encounter: Payer: Self-pay | Admitting: *Deleted

## 2019-02-16 ENCOUNTER — Other Ambulatory Visit: Payer: Self-pay

## 2019-02-16 DIAGNOSIS — Y92019 Unspecified place in single-family (private) house as the place of occurrence of the external cause: Secondary | ICD-10-CM | POA: Diagnosis not present

## 2019-02-16 DIAGNOSIS — S82851A Displaced trimalleolar fracture of right lower leg, initial encounter for closed fracture: Secondary | ICD-10-CM | POA: Diagnosis not present

## 2019-02-16 DIAGNOSIS — W1830XA Fall on same level, unspecified, initial encounter: Secondary | ICD-10-CM | POA: Diagnosis not present

## 2019-02-16 HISTORY — PX: ORIF ANKLE FRACTURE: SHX5408

## 2019-02-16 LAB — URINE DRUG SCREEN, QUALITATIVE (ARMC ONLY)
Amphetamines, Ur Screen: NOT DETECTED
Barbiturates, Ur Screen: NOT DETECTED
Benzodiazepine, Ur Scrn: NOT DETECTED
Cannabinoid 50 Ng, Ur ~~LOC~~: NOT DETECTED
Cocaine Metabolite,Ur ~~LOC~~: NOT DETECTED
MDMA (Ecstasy)Ur Screen: NOT DETECTED
Methadone Scn, Ur: NOT DETECTED
Opiate, Ur Screen: POSITIVE — AB
Phencyclidine (PCP) Ur S: NOT DETECTED
Tricyclic, Ur Screen: POSITIVE — AB

## 2019-02-16 LAB — PREGNANCY, URINE: Preg Test, Ur: NEGATIVE

## 2019-02-16 SURGERY — OPEN REDUCTION INTERNAL FIXATION (ORIF) ANKLE FRACTURE
Anesthesia: General | Site: Ankle | Laterality: Right

## 2019-02-16 MED ORDER — OXYCODONE HCL 5 MG PO TABS
5.0000 mg | ORAL_TABLET | Freq: Once | ORAL | Status: AC
  Administered 2019-02-16: 15:00:00 5 mg via ORAL

## 2019-02-16 MED ORDER — OXYCODONE HCL 5 MG PO TABS
ORAL_TABLET | ORAL | Status: AC
  Filled 2019-02-16: qty 1

## 2019-02-16 MED ORDER — LIDOCAINE HCL (PF) 2 % IJ SOLN
INTRAMUSCULAR | Status: AC
  Filled 2019-02-16: qty 10

## 2019-02-16 MED ORDER — KETOROLAC TROMETHAMINE 15 MG/ML IJ SOLN: 15.0000 mg | Freq: Four times a day (QID) | INTRAMUSCULAR | Status: DC

## 2019-02-16 MED ORDER — DOCUSATE SODIUM 100 MG PO CAPS: 100.0000 mg | ORAL_CAPSULE | Freq: Every day | ORAL | 2 refills | Status: AC | PRN

## 2019-02-16 MED ORDER — DEXMEDETOMIDINE HCL IN NACL 80 MCG/20ML IV SOLN
INTRAVENOUS | Status: AC
  Filled 2019-02-16: qty 20

## 2019-02-16 MED ORDER — OXYCODONE HCL 5 MG PO TABS: 10.0000 mg | ORAL_TABLET | ORAL | Status: DC | PRN

## 2019-02-16 MED ORDER — CLINDAMYCIN PHOSPHATE 900 MG/50ML IV SOLN: 900.0000 mg | INTRAVENOUS | Status: DC

## 2019-02-16 MED ORDER — ONDANSETRON HCL 4 MG PO TABS: 4.0000 mg | ORAL_TABLET | Freq: Four times a day (QID) | ORAL | Status: DC | PRN

## 2019-02-16 MED ORDER — LIDOCAINE HCL (CARDIAC) PF 100 MG/5ML IV SOSY
PREFILLED_SYRINGE | INTRAVENOUS | Status: DC | PRN
  Administered 2019-02-16: 80 mg via INTRAVENOUS

## 2019-02-16 MED ORDER — ROCURONIUM BROMIDE 50 MG/5ML IV SOLN
INTRAVENOUS | Status: AC
  Filled 2019-02-16: qty 1

## 2019-02-16 MED ORDER — SODIUM CHLORIDE FLUSH 0.9 % IV SOLN
INTRAVENOUS | Status: AC
  Filled 2019-02-16: qty 10

## 2019-02-16 MED ORDER — SUGAMMADEX SODIUM 200 MG/2ML IV SOLN
INTRAVENOUS | Status: DC | PRN
  Administered 2019-02-16: 200 mg via INTRAVENOUS

## 2019-02-16 MED ORDER — FENTANYL CITRATE (PF) 100 MCG/2ML IJ SOLN
INTRAMUSCULAR | Status: AC
  Administered 2019-02-16: 25 ug via INTRAVENOUS
  Filled 2019-02-16: qty 2

## 2019-02-16 MED ORDER — FENTANYL CITRATE (PF) 100 MCG/2ML IJ SOLN
INTRAMUSCULAR | Status: AC
  Administered 2019-02-16: 14:00:00 25 ug via INTRAVENOUS
  Filled 2019-02-16: qty 2

## 2019-02-16 MED ORDER — ACETAMINOPHEN 500 MG PO TABS: 1000.0000 mg | ORAL_TABLET | Freq: Four times a day (QID) | ORAL | Status: DC

## 2019-02-16 MED ORDER — FENTANYL CITRATE (PF) 100 MCG/2ML IJ SOLN
INTRAMUSCULAR | Status: DC | PRN
  Administered 2019-02-16 (×4): 25 ug via INTRAVENOUS
  Administered 2019-02-16: 100 ug via INTRAVENOUS

## 2019-02-16 MED ORDER — OXYCODONE HCL 5 MG PO TABS: 5.0000 mg | ORAL_TABLET | ORAL | Status: DC | PRN

## 2019-02-16 MED ORDER — FENTANYL CITRATE (PF) 100 MCG/2ML IJ SOLN
25.0000 ug | INTRAMUSCULAR | Status: AC | PRN
  Administered 2019-02-16 (×8): 25 ug via INTRAVENOUS

## 2019-02-16 MED ORDER — ROCURONIUM BROMIDE 100 MG/10ML IV SOLN
INTRAVENOUS | Status: DC | PRN
  Administered 2019-02-16 (×2): 10 mg via INTRAVENOUS
  Administered 2019-02-16: 5 mg via INTRAVENOUS
  Administered 2019-02-16: 25 mg via INTRAVENOUS
  Administered 2019-02-16: 10 mg via INTRAVENOUS

## 2019-02-16 MED ORDER — FENTANYL CITRATE (PF) 100 MCG/2ML IJ SOLN
INTRAMUSCULAR | Status: AC
  Filled 2019-02-16: qty 2

## 2019-02-16 MED ORDER — DEXAMETHASONE SODIUM PHOSPHATE 10 MG/ML IJ SOLN
INTRAMUSCULAR | Status: AC
  Filled 2019-02-16: qty 1

## 2019-02-16 MED ORDER — CLINDAMYCIN PHOSPHATE 900 MG/50ML IV SOLN
INTRAVENOUS | Status: AC
  Filled 2019-02-16: qty 50

## 2019-02-16 MED ORDER — METOCLOPRAMIDE HCL 5 MG/ML IJ SOLN: 5.0000 mg | Freq: Three times a day (TID) | INTRAMUSCULAR | Status: DC | PRN

## 2019-02-16 MED ORDER — OXYCODONE HCL 5 MG/5ML PO SOLN: 5.0000 mg | Freq: Once | ORAL | Status: AC | PRN

## 2019-02-16 MED ORDER — PROMETHAZINE HCL 25 MG/ML IJ SOLN
6.2500 mg | INTRAMUSCULAR | Status: DC | PRN
  Administered 2019-02-16: 14:00:00 6.25 mg via INTRAVENOUS

## 2019-02-16 MED ORDER — POVIDONE-IODINE 10 % EX SWAB: 2.0000 "application " | Freq: Once | CUTANEOUS | Status: DC

## 2019-02-16 MED ORDER — SUGAMMADEX SODIUM 200 MG/2ML IV SOLN
INTRAVENOUS | Status: AC
  Filled 2019-02-16: qty 2

## 2019-02-16 MED ORDER — DEXMEDETOMIDINE HCL 200 MCG/2ML IV SOLN
INTRAVENOUS | Status: DC | PRN
  Administered 2019-02-16 (×7): 4 ug via INTRAVENOUS

## 2019-02-16 MED ORDER — FENTANYL CITRATE (PF) 100 MCG/2ML IJ SOLN
INTRAMUSCULAR | Status: AC
  Administered 2019-02-16: 15:00:00 25 ug via INTRAVENOUS
  Filled 2019-02-16: qty 2

## 2019-02-16 MED ORDER — ACETAMINOPHEN 325 MG PO TABS: 325.0000 mg | ORAL_TABLET | Freq: Four times a day (QID) | ORAL | Status: DC | PRN

## 2019-02-16 MED ORDER — ONDANSETRON HCL 4 MG/2ML IJ SOLN
INTRAMUSCULAR | Status: AC
  Filled 2019-02-16: qty 2

## 2019-02-16 MED ORDER — PHENYLEPHRINE HCL (PRESSORS) 10 MG/ML IV SOLN
INTRAVENOUS | Status: DC | PRN
  Administered 2019-02-16 (×3): 100 ug via INTRAVENOUS

## 2019-02-16 MED ORDER — FENTANYL CITRATE (PF) 100 MCG/2ML IJ SOLN
25.0000 ug | INTRAMUSCULAR | Status: AC | PRN
  Administered 2019-02-16 (×4): 25 ug via INTRAVENOUS

## 2019-02-16 MED ORDER — PROMETHAZINE HCL 25 MG/ML IJ SOLN
INTRAMUSCULAR | Status: AC
  Administered 2019-02-16: 6.25 mg via INTRAVENOUS
  Filled 2019-02-16: qty 1

## 2019-02-16 MED ORDER — PROPOFOL 10 MG/ML IV BOLUS
INTRAVENOUS | Status: DC | PRN
  Administered 2019-02-16: 200 mg via INTRAVENOUS

## 2019-02-16 MED ORDER — HYDROMORPHONE HCL 1 MG/ML IJ SOLN: 0.5000 mg | INTRAMUSCULAR | Status: DC | PRN

## 2019-02-16 MED ORDER — SUCCINYLCHOLINE CHLORIDE 20 MG/ML IJ SOLN
INTRAMUSCULAR | Status: AC
  Filled 2019-02-16: qty 1

## 2019-02-16 MED ORDER — OXYCODONE-ACETAMINOPHEN 10-325 MG PO TABS: 1.0000 | ORAL_TABLET | Freq: Four times a day (QID) | ORAL | 0 refills | Status: DC | PRN

## 2019-02-16 MED ORDER — BUPIVACAINE-EPINEPHRINE (PF) 0.25% -1:200000 IJ SOLN
INTRAMUSCULAR | Status: AC
  Filled 2019-02-16: qty 30

## 2019-02-16 MED ORDER — FENTANYL CITRATE (PF) 100 MCG/2ML IJ SOLN
25.0000 ug | INTRAMUSCULAR | Status: AC | PRN
  Administered 2019-02-16 (×4): 25 ug via INTRAVENOUS

## 2019-02-16 MED ORDER — MIDAZOLAM HCL 2 MG/2ML IJ SOLN
INTRAMUSCULAR | Status: AC
  Filled 2019-02-16: qty 2

## 2019-02-16 MED ORDER — DEXAMETHASONE SODIUM PHOSPHATE 10 MG/ML IJ SOLN
INTRAMUSCULAR | Status: DC | PRN
  Administered 2019-02-16: 10 mg via INTRAVENOUS

## 2019-02-16 MED ORDER — LACTATED RINGERS IV SOLN: INTRAVENOUS | Status: DC

## 2019-02-16 MED ORDER — BACITRACIN 50000 UNITS IM SOLR
INTRAMUSCULAR | Status: AC
  Filled 2019-02-16: qty 1

## 2019-02-16 MED ORDER — ONDANSETRON HCL 4 MG/2ML IJ SOLN: 4.0000 mg | Freq: Four times a day (QID) | INTRAMUSCULAR | Status: DC | PRN

## 2019-02-16 MED ORDER — OXYCODONE HCL 5 MG PO TABS
ORAL_TABLET | ORAL | Status: AC
  Administered 2019-02-16: 15:00:00 5 mg via ORAL
  Filled 2019-02-16: qty 1

## 2019-02-16 MED ORDER — METOCLOPRAMIDE HCL 10 MG PO TABS: 5.0000 mg | ORAL_TABLET | Freq: Three times a day (TID) | ORAL | Status: DC | PRN

## 2019-02-16 MED ORDER — MIDAZOLAM HCL 2 MG/2ML IJ SOLN
INTRAMUSCULAR | Status: DC | PRN
  Administered 2019-02-16: 2 mg via INTRAVENOUS

## 2019-02-16 MED ORDER — OXYCODONE HCL 5 MG PO TABS
5.0000 mg | ORAL_TABLET | Freq: Once | ORAL | Status: AC | PRN
  Administered 2019-02-16: 15:00:00 5 mg via ORAL

## 2019-02-16 MED ORDER — SUCCINYLCHOLINE CHLORIDE 20 MG/ML IJ SOLN
INTRAMUSCULAR | Status: DC | PRN
  Administered 2019-02-16: 120 mg via INTRAVENOUS

## 2019-02-16 MED ORDER — CHLORHEXIDINE GLUCONATE 4 % EX LIQD: 60.0000 mL | Freq: Once | CUTANEOUS | Status: DC

## 2019-02-16 SURGICAL SUPPLY — 53 items
BIT DRILL 2.5X110 QC LCP DISP (BIT) ×3 IMPLANT
BIT DRILL CANN 2.7X625 NONSTRL (BIT) ×3 IMPLANT
BIT DRILL LCP QC 2X140 (BIT) ×3 IMPLANT
BNDG COHESIVE 6X5 TAN STRL LF (GAUZE/BANDAGES/DRESSINGS) ×3 IMPLANT
BNDG ELASTIC 6X5.8 VLCR STR LF (GAUZE/BANDAGES/DRESSINGS) ×3 IMPLANT
BNDG ESMARK 6X12 TAN STRL LF (GAUZE/BANDAGES/DRESSINGS) ×3 IMPLANT
BRUSH SCRUB EZ  4% CHG (MISCELLANEOUS) ×4
BRUSH SCRUB EZ 4% CHG (MISCELLANEOUS) ×2 IMPLANT
CANISTER SUCT 1200ML W/VALVE (MISCELLANEOUS) ×3 IMPLANT
CHLORAPREP W/TINT 26 (MISCELLANEOUS) ×6 IMPLANT
COVER WAND RF STERILE (DRAPES) ×3 IMPLANT
CUFF TOURN SGL QUICK 24 (TOURNIQUET CUFF)
CUFF TOURN SGL QUICK 30 (TOURNIQUET CUFF)
CUFF TRNQT CYL 24X4X16.5-23 (TOURNIQUET CUFF) IMPLANT
CUFF TRNQT CYL 30X4X21-28X (TOURNIQUET CUFF) IMPLANT
DRAPE 3/4 80X56 (DRAPES) ×3 IMPLANT
DRAPE FLUOR MINI C-ARM 54X84 (DRAPES) ×3 IMPLANT
ELECT REM PT RETURN 9FT ADLT (ELECTROSURGICAL) ×3
ELECTRODE REM PT RTRN 9FT ADLT (ELECTROSURGICAL) ×1 IMPLANT
GAUZE XEROFORM 1X8 LF (GAUZE/BANDAGES/DRESSINGS) ×6 IMPLANT
GLOVE INDICATOR 8.0 STRL GRN (GLOVE) ×9 IMPLANT
GLOVE SURG ORTHO 8.0 STRL STRW (GLOVE) ×9 IMPLANT
GOWN STRL REUS W/ TWL LRG LVL3 (GOWN DISPOSABLE) ×1 IMPLANT
GOWN STRL REUS W/ TWL XL LVL3 (GOWN DISPOSABLE) ×1 IMPLANT
GOWN STRL REUS W/TWL LRG LVL3 (GOWN DISPOSABLE) ×2
GOWN STRL REUS W/TWL XL LVL3 (GOWN DISPOSABLE) ×2
GUIDEWARE NON THREAD 1.25X150 (WIRE) ×6
GUIDEWIRE NON THREAD 1.25X150 (WIRE) ×2 IMPLANT
KIT TURNOVER KIT A (KITS) ×3 IMPLANT
NS IRRIG 1000ML POUR BTL (IV SOLUTION) ×3 IMPLANT
PACK EXTREMITY ARMC (MISCELLANEOUS) ×3 IMPLANT
PAD ABD DERMACEA PRESS 5X9 (GAUZE/BANDAGES/DRESSINGS) ×12 IMPLANT
PAD CAST CTTN 4X4 STRL (SOFTGOODS) ×2 IMPLANT
PADDING CAST COTTON 4X4 STRL (SOFTGOODS) ×4
PLATE 3HOLE DISTAL FIBULA 2.7 (Plate) ×3 IMPLANT
SCALPEL PROTECTED #10 DISP (BLADE) ×3 IMPLANT
SCALPEL PROTECTED #15 DISP (BLADE) ×3 IMPLANT
SCREW BONE CORTEX 3.5 46MM (Screw) ×3 IMPLANT
SCREW CANN S THRD/44 4.0 (Screw) ×6 IMPLANT
SCREW CORT LP ST 3.5X14 (Screw) ×6 IMPLANT
SCREW LOCK VA ST 2.7X18 (Screw) ×9 IMPLANT
SCREW METAPHYSCAL 18MM (Screw) ×3 IMPLANT
SCREW SELF TAP 14MM (Screw) ×3 IMPLANT
SPLINT CAST 1 STEP 5X30 WHT (MISCELLANEOUS) ×3 IMPLANT
SPONGE LAP 18X18 RF (DISPOSABLE) ×3 IMPLANT
STAPLER SKIN PROX 35W (STAPLE) ×3 IMPLANT
SUT VIC AB 2-0 SH 27 (SUTURE) ×2
SUT VIC AB 2-0 SH 27XBRD (SUTURE) ×1 IMPLANT
SUT VIC AB 3-0 SH 27 (SUTURE) ×2
SUT VIC AB 3-0 SH 27X BRD (SUTURE) ×1 IMPLANT
TAPE SURG TRANSPORE 1 IN (GAUZE/BANDAGES/DRESSINGS) ×1 IMPLANT
TAPE SURGICAL TRANSPORE 1 IN (GAUZE/BANDAGES/DRESSINGS) ×2
TOWEL OR 17X26 4PK STRL BLUE (TOWEL DISPOSABLE) ×6 IMPLANT

## 2019-02-16 NOTE — Anesthesia Post-op Follow-up Note (Signed)
Anesthesia QCDR form completed.        

## 2019-02-16 NOTE — Discharge Instructions (Signed)

## 2019-02-16 NOTE — Anesthesia Procedure Notes (Signed)
Procedure Name: Intubation Date/Time: 02/16/2019 11:38 AM Performed by: Lily Peer, Summer, RN Pre-anesthesia Checklist: Patient identified, Patient being monitored, Timeout performed, Emergency Drugs available and Suction available Patient Re-evaluated:Patient Re-evaluated prior to induction Oxygen Delivery Method: Circle system utilized Preoxygenation: Pre-oxygenation with 100% oxygen Induction Type: IV induction Ventilation: Mask ventilation without difficulty Laryngoscope Size: Mac and 3 Grade View: Grade I Tube type: Oral Tube size: 7.0 mm Number of attempts: 1 Airway Equipment and Method: Stylet Placement Confirmation: ETT inserted through vocal cords under direct vision,  positive ETCO2 and breath sounds checked- equal and bilateral Secured at: 21 cm Tube secured with: Tape Dental Injury: Teeth and Oropharynx as per pre-operative assessment

## 2019-02-16 NOTE — Progress Notes (Signed)
pt stated pain is tolerable at 7/10 on pain scale.  Informed Dr. Marcello Moores all pain meds that were given per Provider's orders and pain is still 8.  Dr. Marcello Moores stated OK to d/c pt to post op to go home.

## 2019-02-16 NOTE — H&P (Signed)
The patient has been re-examined, and the chart reviewed, and there have been no interval changes to the documented history and physical.  Plan a right ankle open reduction and internal fixation today.  Anesthesia is consulted regarding a peripheral nerve block for post-operative pain.  The risks, benefits, and alternatives have been discussed at length, and the patient is willing to proceed.     

## 2019-02-16 NOTE — Transfer of Care (Signed)
Immediate Anesthesia Transfer of Care Note  Patient: Chiropodist  Procedure(s) Performed: OPEN REDUCTION INTERNAL FIXATION (ORIF) ANKLE FRACTURE (Right Ankle)  Patient Location: PACU  Anesthesia Type:General  Level of Consciousness: drowsy and patient cooperative  Airway & Oxygen Therapy: Patient Spontanous Breathing and Patient connected to face mask oxygen  Post-op Assessment: Report given to RN and Post -op Vital signs reviewed and stable  Post vital signs: Reviewed and stable  Last Vitals:  Vitals Value Taken Time  BP 104/56 02/16/19 1332  Temp 36.7 C 02/16/19 1332  Pulse 67 02/16/19 1332  Resp 17 02/16/19 1332  SpO2 100 % 02/16/19 1332    Last Pain:  Vitals:   02/16/19 1332  TempSrc:   PainSc: Asleep         Complications: No apparent anesthesia complications

## 2019-02-17 ENCOUNTER — Encounter: Payer: Self-pay | Admitting: Orthopedic Surgery

## 2019-02-17 NOTE — Progress Notes (Signed)
Call both numbers listed

## 2019-02-17 NOTE — Op Note (Signed)
  02/16/2019  7:41 AM  PATIENT:  Madison Singleton  28 y.o. female  PRE-OPERATIVE DIAGNOSIS:  S82.851A Displaced trimallelor fracture of right lower leg, closed fracture  POST-OPERATIVE DIAGNOSIS:  Same  PROCEDURE:  RIGHT OPEN REDUCTION INTERNAL FIXATION (ORIF) ANKLE FRACTURE, REDUCTION AND FIXATION OF THE SYNDESMOSIS  SURGEON:  Kurtis Bushman, MD  ASST:  Carlynn Spry, PA-C  ANESTHESIA:   General  EBL:  20 mL  TOURNIQUET TIME:  68 min  OPERATIVE FINDINGS:  Unstable, closed trimalleolar fracture and disruption of the syndesmosis  OPERATIVE PROCEDURE:   The patient was brought to the operating room and placed in the supine position. All bony prominences were padded. General anesthesia was administered. The lower extremity was prepped and draped in the usual sterile fashion. The leg was elevated and exsanguinated and the tourniquet was inflated. Time out was performed.   Incision was made over the distal fibula and the fracture was exposed and reduced anatomically with a clamp. A lag screw was placed. I then applied a synthes lateral right locking plate and secured it proximally with cortical screws and distally with one non-locking and 3 locking screws.  I used the Fluoroscan to confirm satisfactory reduction and fixation.   I then turned my attention to the medial malleolus. Incision was made over the medial malleolus and the fracture exposed and held provisionally with a clamp. 2 guidepins were placed for the 4.0 mm cannulated screws and then confirmation of reduction was made with fluoroscopy. I then placed 2  42mm screws which had satisfactory fixation. Fluoroscopy showed good reduction and hardware placement.   The syndesmosis was stressed using live fluoroscopy and found to be unstable.   A large bone clamp was placed and a syndesmotic screw placed through 3 cortices.  The ankle was stressed and found to be stable.  The medial and lateral wounds were irrigated, and closed with  vicryl and staples. Sponge and needle counts were correct. Sterile gauze was applied followed by a posterior splint. She was awakened and returned to the PACU in stable and satisfactory condition. There were no apparent complications.  Kurtis Bushman, MD

## 2019-02-17 NOTE — Anesthesia Postprocedure Evaluation (Signed)
Anesthesia Post Note  Patient: Chiropodist  Procedure(s) Performed: OPEN REDUCTION INTERNAL FIXATION (ORIF) ANKLE FRACTURE (Right Ankle)  Patient location during evaluation: PACU Anesthesia Type: General Level of consciousness: awake and alert and oriented Pain management: pain level controlled Vital Signs Assessment: post-procedure vital signs reviewed and stable Respiratory status: spontaneous breathing, nonlabored ventilation and respiratory function stable Cardiovascular status: blood pressure returned to baseline and stable Postop Assessment: no signs of nausea or vomiting Anesthetic complications: no     Last Vitals:  Vitals:   02/16/19 1532 02/16/19 1546  BP: 134/90 138/71  Pulse: 77 91  Resp: (!) 23 18  Temp:  36.6 C  SpO2: 97% 98%    Last Pain:  Vitals:   02/16/19 1546  TempSrc: Temporal  PainSc: 10-Worst pain ever                 Charmane Protzman

## 2019-06-17 ENCOUNTER — Emergency Department (HOSPITAL_COMMUNITY)
Admission: EM | Admit: 2019-06-17 | Discharge: 2019-06-17 | Disposition: A | Discharge status: Home / Self Care | Payer: Medicaid Other | Attending: Emergency Medicine | Admitting: Emergency Medicine

## 2019-06-17 ENCOUNTER — Emergency Department (HOSPITAL_COMMUNITY): Payer: Medicaid Other

## 2019-06-17 ENCOUNTER — Other Ambulatory Visit: Payer: Self-pay

## 2019-06-17 DIAGNOSIS — X509XXA Other and unspecified overexertion or strenuous movements or postures, initial encounter: Secondary | ICD-10-CM | POA: Insufficient documentation

## 2019-06-17 DIAGNOSIS — F17210 Nicotine dependence, cigarettes, uncomplicated: Secondary | ICD-10-CM | POA: Insufficient documentation

## 2019-06-17 DIAGNOSIS — M25571 Pain in right ankle and joints of right foot: Secondary | ICD-10-CM | POA: Diagnosis not present

## 2019-06-17 DIAGNOSIS — Z79899 Other long term (current) drug therapy: Secondary | ICD-10-CM | POA: Insufficient documentation

## 2019-06-17 MED ORDER — HYDROCODONE-ACETAMINOPHEN 5-325 MG PO TABS: 1.0000 | ORAL_TABLET | Freq: Four times a day (QID) | ORAL | 0 refills | Status: DC | PRN

## 2019-06-17 MED ORDER — OXYCODONE-ACETAMINOPHEN 5-325 MG PO TABS
1.0000 | ORAL_TABLET | ORAL | Status: DC | PRN
  Administered 2019-06-17: 1 via ORAL
  Filled 2019-06-17: qty 1

## 2019-06-17 NOTE — ED Provider Notes (Addendum)
[facility] EMERGENCY DEPARTMENT Provider Note   CSN: [id number] Arrival date & time: 06/17/19  8144     History Chief Complaint  Patient presents with  . Ankle Pain    Madison Singleton is a 29 y.o. female presents today for right ankle pain and swelling that began last night.  Patient reports that she had surgery performed on her right ankle in November last year, has been healing well, pain only with weather changes.  However patient reports that yesterday she "did too much" she reports that she was cleaning her house throughout the entire day and after she was done later that night she developed right ankle pain.  She reports a throbbing sensation to her entire right ankle, occasionally radiating up her leg slightly, worsened with ambulation, minimally improved with ibuprofen at home.  Associated with generalized ankle swelling.  She reports that she called her orthopedist but cannot get an appointment.  She denies any fall/injury, numbness/tingling, weakness, color change, skin break/wound, fever/chills or any additional concerns.  HPI     Past Medical History:  Diagnosis Date  . Anxiety    NO MEDS  . Breast mass   . Breast tenderness in female    3;00 x3 weeks  . Depression    NO MEDS  . GERD (gastroesophageal reflux disease)    OCC-NO MEDS  . Headache    HAS RECENTLY STARTED HAVING HA'S MORE FREQUENTLY    Patient Active Problem List   Diagnosis Date Noted  . Supervision of high risk pregnancy in third trimester 03/25/2018  . Pelvic pressure in pregnancy, antepartum, third trimester 02/26/2018  . Generalized anxiety disorder 07/11/2016  . Cocaine abuse (HCC) 07/11/2016    Past Surgical History:  Procedure Laterality Date  . KNEE ARTHROSCOPY WITH MEDIAL MENISECTOMY Right 09/07/2015   Procedure: Arthroscopic Lateral Release ;  Surgeon: Kennedy Bucker, MD;  Location: [facility] ORS;  Service: Orthopedics;  Laterality: Right;  . MOUTH SURGERY  12/2018  .  ORIF ANKLE FRACTURE Right 02/16/2019   Procedure: OPEN REDUCTION INTERNAL FIXATION (ORIF) ANKLE FRACTURE;  Surgeon: Lyndle Herrlich, MD;  Location: [facility] ORS;  Service: Orthopedics;  Laterality: Right;     OB History    Gravida  7   Para  6   Term  6   Preterm      AB      Living  6     SAB      TAB      Ectopic      Multiple      Live Births  6           Family History  Problem Relation Age of Onset  . Breast cancer Cousin     Social History   Tobacco Use  . Smoking status: Current Every Day Smoker    Packs/day: 0.10    Years: 14.00    Pack years: 1.40    Types: Cigarettes  . Smokeless tobacco: Never Used  Substance Use Topics  . Alcohol use: Yes    Alcohol/week: 1.0 standard drinks    Types: 1 Cans of beer per week    Comment: SOCIAL  . Drug use: Not Currently    Types: Cocaine    Home Medications Prior to Admission medications   Medication Sig Start Date End Date Taking? Authorizing Provider  cyclobenzaprine (FLEXERIL) 5 MG tablet Take 1 tablet (5 mg total) by mouth 3 (three) times daily as needed. Patient not taking: Reported on 02/16/2019 02/05/19  Menshew, Charlesetta Ivory, PA-C  docusate sodium (COLACE) 100 MG capsule Take 1 capsule (100 mg total) by mouth daily as needed. 02/16/19 02/16/20  Lyndle Herrlich, MD  HYDROcodone-acetaminophen (NORCO/VICODIN) 5-325 MG tablet Take 1 tablet by mouth every 6 (six) hours as needed. 06/17/19   Harlene Salts A, PA-C  ibuprofen (ADVIL) 600 MG tablet Take 1 tablet (600 mg total) by mouth every 8 (eight) hours as needed. Patient not taking: Reported on 02/08/2019 01/18/19   Tommi Rumps, PA-C  oxyCODONE-acetaminophen (PERCOCET) 10-325 MG tablet Take 1 tablet by mouth every 6 (six) hours as needed for pain. 02/16/19 02/16/20  Lyndle Herrlich, MD  QUEtiapine (SEROQUEL) 100 MG tablet Take by mouth at bedtime.    [provider]    Allergies    Haldol [haloperidol lactate] and  Amoxicillin  Review of Systems   Review of Systems  Constitutional: Negative for chills and fever.  Gastrointestinal: Negative for abdominal pain, nausea and vomiting.  Musculoskeletal: Positive for arthralgias and joint swelling.  Skin: Negative.  Negative for color change and wound.  Neurological: Negative.  Negative for weakness and numbness.    Physical Exam Updated Vital Signs BP 105/76 (BP Location: Right Arm)   Pulse 96   Temp (!) 97.4 F (36.3 C) (Oral)   Resp 20   Ht 5\' 3"  (1.6 m)   Wt 79.4 kg   LMP 05/27/2019   SpO2 99%   BMI 31.00 kg/m   Physical Exam Constitutional:      General: She is not in acute distress.    Appearance: Normal appearance. She is well-developed. She is obese. She is not ill-appearing or diaphoretic.  HENT:     Head: Normocephalic and atraumatic.     Right Ear: External ear normal.     Left Ear: External ear normal.     Nose: Nose normal.  Eyes:     General: Vision grossly intact. Gaze aligned appropriately.     Pupils: Pupils are equal, round, and reactive to light.  Neck:     Trachea: Trachea and phonation normal. No tracheal deviation.  Cardiovascular:     Rate and Rhythm: Normal rate and regular rhythm.     Pulses: Normal pulses.          Dorsalis pedis pulses are 2+ on the right side and 2+ on the left side.       Posterior tibial pulses are 2+ on the right side and 2+ on the left side.  Pulmonary:     Effort: Pulmonary effort is normal. No respiratory distress.  Abdominal:     General: There is no distension.     Palpations: Abdomen is soft.     Tenderness: There is no abdominal tenderness. There is no guarding or rebound.  Musculoskeletal:     Cervical back: Normal range of motion.     Right knee: Normal.     Left knee: Normal.     Right lower leg: Normal. No swelling or tenderness. No edema.     Left lower leg: Normal. No swelling or tenderness. No edema.     Right ankle: Swelling present. No deformity. Decreased range of  motion.     Right Achilles Tendon: Normal.     Left ankle: Normal.     Left Achilles Tendon: Normal.     Right foot: Normal.     Left foot: Normal.  Feet:     Right foot:     Protective Sensation: 5 sites tested. 5  sites sensed.     Skin integrity: Skin integrity normal.     Left foot:     Protective Sensation: 5 sites tested. 5 sites sensed.     Skin integrity: Skin integrity normal.  Skin:    General: Skin is warm and dry.  Neurological:     Mental Status: She is alert.     GCS: GCS eye subscore is 4. GCS verbal subscore is 5. GCS motor subscore is 6.     Comments: Speech is clear and goal oriented, follows commands Major Cranial nerves without deficit, no facial droop Moves extremities without ataxia, coordination intact  Psychiatric:        Behavior: Behavior normal.     ED Results / Procedures / Treatments   Labs (all labs ordered are listed, but only abnormal results are displayed) Labs Reviewed - No data to display  EKG None  Radiology DG Ankle Complete Right  Result Date: 06/17/2019 CLINICAL DATA:  Recent surgery of right ankle with pain. EXAM: RIGHT ANKLE - COMPLETE 3+ VIEW COMPARISON:  February 05, 2019 FINDINGS: No acute fracture or dislocation is identified. Prior fixation of the right ankle with side plate fixated by horizontal screws through distal fibula as well as screws within the medial tibia are identified. IMPRESSION: No acute fracture or dislocation. Postsurgical changes of the right ankle. Electronically Signed   By: Abelardo Diesel M.D.   On: 06/17/2019 11:25    Procedures Procedures (including critical care time)  Medications Ordered in ED Medications  oxyCODONE-acetaminophen (PERCOCET/ROXICET) 5-325 MG per tablet 1 tablet (1 tablet Oral Given 06/17/19 1104)    ED Course  I have reviewed the triage vital signs and the nursing notes.  Pertinent labs & imaging results that were available during my care of the patient were reviewed by me and  considered in my medical decision making (see chart for details).    MDM Rules/Calculators/A&P                     Operative note 02/16/2019 reviewed: Displaced trimalleolar fracture of the right lower leg, open reduction internal fixation performed by Dr. Harlow Mares.  Review of PDMP reveals patient prescribed a 5-day prescription (30 pills) Percocet on June 07, 2019 by Dr. Harlow Mares. - 29 year old female arrives today for pain and swelling of the right ankle that began last night after she was on her feet most of the day cleaning her home, she believes that she may have overexerted herself, she denies any specific injury or trauma of the ankle. No infectious symptoms. She has mild swelling and tenderness of the ankle with some decreased range of motion secondary to pain.  Capillary refill and sensation intact to all toes, strong and equal pedal pulses, compartments soft, Achilles tendon intact, no pain or swelling of the calf or knee.  There is no evidence of DVT, compartment syndrome, cellulitis, septic arthritis or other emergent pathologies at this time.  X-ray performed in triage reveals postsurgical changes without acute fracture or dislocation, personally reviewed patient's x-ray and agree with radiologist.  Patient was given 1 Percocet by triage staff with improvement of pain.  Patient was placed in splint and given crutches, informed to avoid weightbearing for the next few days and to use ice/elevation to help with her symptoms.  I encourage patient to call her orthopedist today to schedule a follow-up appointment for reevaluation, she also requested a referral to an orthopedist here in [place] which was given.  Patient has a ride  home from the ER today, it appears she had a 5-day prescription from her orthopedist which ran out around 5 days ago, will give patient short course (2 pills) of Norco.  There is no indication for further work-up or treatment in the ER at this time.  Of note patient denies  chance of pregnancy today.  Patient states understanding that medications given or prescribed today may result in harm to of a pregnancy and she accepts these risks and still chooses not to be pregnancy tested and proceed with medications.  At this time there does not appear to be any evidence of an acute emergency medical condition and the patient appears stable for discharge with appropriate outpatient follow up. Diagnosis was discussed with patient who verbalizes understanding of care plan and is agreeable to discharge. I have discussed return precautions with patient and family who verbalizes understanding of return precautions. Patient encouraged to follow-up with their PCP and ortho. All questions answered. Patient has been discharged in good condition. ------ Addendum: 2:09 PM: Patient requesting that prescription be sent to [facility] on [address] in [place] and [address]. she was unable to switch this via phone with the company.  Initial prescription was canceled and 2 pills Norco were sent to the [address] pharmacy.  Note: Portions of this report may have been transcribed using voice recognition software. Every effort was made to ensure accuracy; however, inadvertent computerized transcription errors may still be present. Final Clinical Impression(s) / ED Diagnoses Final diagnoses:  Acute right ankle pain    Rx / DC Orders ED Discharge Orders         Ordered    HYDROcodone-acetaminophen (NORCO/VICODIN) 5-325 MG tablet  Every 6 hours PRN     06/17/19 1244           Bill Salinas, PA-C 06/17/19 1300    Elizabeth Palau 06/17/19 1410    Terald Sleeper, MD 06/17/19 1904

## 2019-06-17 NOTE — Progress Notes (Signed)
Orthopedic Tech Progress Note Patient Details:  Madison Singleton 1990/12/02 [id number]  Ortho Devices Type of Ortho Device: Crutches, Ankle Air splint Ortho Device/Splint Location: RLE Ortho Device/Splint Interventions: Ordered, Application   Post Interventions Patient Tolerated: Well Instructions Provided: Care of device   Jennye Moccasin 06/17/2019, 1:40 PM

## 2019-06-17 NOTE — ED Triage Notes (Signed)
Right ankle swollen and painful-- hx of surgery 2 months ago-- positive pedal pulse,

## 2019-06-17 NOTE — Discharge Instructions (Addendum)
You have been diagnosed today with right ankle pain.  At this time there does not appear to be the presence of an emergent medical condition, however there is always the potential for conditions to change. Please read and follow the below instructions.  Please return to the Emergency Department immediately for any new or worsening symptoms. Please be sure to follow up with your Primary Care Provider within one week regarding your visit today; please call their office to schedule an appointment even if you are feeling better for a follow-up visit. Please call your orthopedic specialist Dr. Odis Luster on your discharge paperwork to schedule a follow-up appointment regarding your right ankle pain and swelling.  You have been given referral to a [facility] orthopedic specialist Dr. Carola Frost that you may call if you are unable to follow-up with your previous orthopedic specialist for any reason however I advise that you follow-up with the surgeon who performed your ankle surgery. You have been prescribed a pain medication today called Norco which you may use for severe pain.  Do not drive or perform any dangerous activities while taking Norco as it will make you drowsy.  Do not drink alcohol or take any other sedating medications with Norco as this will worsen side effects. Using rest, ice and elevation will be the most helpful thing to do for your right ankle pain and swelling.  Use the crutches and brace provided to you today and keep weight off of your ankle for the next few days until your symptoms improve.  Get help right away if: Your foot, leg, toes, or ankle: Tingles or becomes numb. Becomes swollen. Turns pale or blue. You have fever or chills You develop redness or color change of your ankle You have any new/concerning or worsening of symptoms  Please read the additional information packets attached to your discharge summary.  Do not take your medicine if  develop an itchy rash, swelling in your  mouth or lips, or difficulty breathing; call 911 and seek immediate emergency medical attention if this occurs.  Note: Portions of this text may have been transcribed using voice recognition software. Every effort was made to ensure accuracy; however, inadvertent computerized transcription errors may still be present.

## 2019-07-24 DIAGNOSIS — R059 Cough, unspecified: Secondary | ICD-10-CM | POA: Insufficient documentation

## 2019-08-09 ENCOUNTER — Other Ambulatory Visit: Payer: Self-pay

## 2019-08-09 ENCOUNTER — Emergency Department
Admission: EM | Admit: 2019-08-09 | Discharge: 2019-08-09 | Disposition: A | Discharge status: Home / Self Care | Payer: Medicaid Other | Attending: Emergency Medicine | Admitting: Emergency Medicine

## 2019-08-09 ENCOUNTER — Encounter: Payer: Self-pay | Admitting: Emergency Medicine

## 2019-08-09 DIAGNOSIS — M25571 Pain in right ankle and joints of right foot: Secondary | ICD-10-CM | POA: Insufficient documentation

## 2019-08-09 DIAGNOSIS — Z5321 Procedure and treatment not carried out due to patient leaving prior to being seen by health care provider: Secondary | ICD-10-CM | POA: Insufficient documentation

## 2019-08-09 NOTE — ED Notes (Signed)
Pt called in the WR with no response 

## 2019-08-09 NOTE — ED Notes (Signed)
Pt called with no response in the pod D wait and the WR x3 with no response

## 2019-08-09 NOTE — ED Triage Notes (Signed)
C/O right ankle pain and swelling today. Denies injury, but had surgery to that ankle in November 2020.  Ambulates with easy and steady gait. NAD

## 2019-09-21 ENCOUNTER — Ambulatory Visit (INDEPENDENT_AMBULATORY_CARE_PROVIDER_SITE_OTHER): Payer: Medicaid Other

## 2019-09-21 ENCOUNTER — Other Ambulatory Visit: Payer: Self-pay

## 2019-09-21 ENCOUNTER — Encounter: Payer: Self-pay | Admitting: Podiatry

## 2019-09-21 ENCOUNTER — Ambulatory Visit: Payer: Medicaid Other | Admitting: Podiatry

## 2019-09-21 DIAGNOSIS — S93401A Sprain of unspecified ligament of right ankle, initial encounter: Secondary | ICD-10-CM | POA: Diagnosis not present

## 2019-09-21 DIAGNOSIS — S93491A Sprain of other ligament of right ankle, initial encounter: Secondary | ICD-10-CM

## 2019-09-21 DIAGNOSIS — T8484XA Pain due to internal orthopedic prosthetic devices, implants and grafts, initial encounter: Secondary | ICD-10-CM

## 2019-09-21 DIAGNOSIS — M19079 Primary osteoarthritis, unspecified ankle and foot: Secondary | ICD-10-CM

## 2019-09-21 NOTE — Progress Notes (Signed)
Subjective:  Patient ID: Madison Singleton, female    DOB: [birth year],  MRN: [id number]  Chief Complaint  Patient presents with  . Foot Pain    pt is here for right ankle pain, medial to lateral side, pt states that the pain/ swelling has been going on for about a year.    29 y.o. female presents with the above complaint.  Patient presents with a complaint of right painful ankle that has been going on for quite some time.  Patient had a history of ankle fracture that was in November 2020 and has not been able to heal the same.  Patient states it shattered while falling down in the bathroom.  There is some swelling and some burning sensation associated with it.  Patient had ankle fracture done at [facility] and would like to further defer care over to me.  She denies any other acute complaints.  She has not seen anyone else prior to see me.  She has not followed up with [facility] as she was not happy with the physician.  She would like to know if there is anything else that could be done.  She states that she went to pain management as well who recommended that she follow-up with [place] for possible total ankle implant.   Review of Systems: Negative except as noted in the HPI. Denies N/V/F/Ch.  Past Medical History:  Diagnosis Date  . Anxiety    NO MEDS  . Breast mass   . Breast tenderness in female    3;00 x3 weeks  . Depression    NO MEDS  . GERD (gastroesophageal reflux disease)    OCC-NO MEDS  . Headache    HAS RECENTLY STARTED HAVING HA'S MORE FREQUENTLY    Current Outpatient Medications:  .  cyclobenzaprine (FLEXERIL) 5 MG tablet, Take 1 tablet (5 mg total) by mouth 3 (three) times daily as needed., Disp: 15 tablet, Rfl: 0 .  docusate sodium (COLACE) 100 MG capsule, Take 1 capsule (100 mg total) by mouth daily as needed., Disp: 30 capsule, Rfl: 2 .  HYDROcodone-acetaminophen (NORCO/VICODIN) 5-325 MG tablet, Take 1 tablet by mouth every 6 (six) hours as needed., Disp: 2 tablet, Rfl:  0 .  ibuprofen (ADVIL) 600 MG tablet, Take 1 tablet (600 mg total) by mouth every 8 (eight) hours as needed., Disp: 20 tablet, Rfl: 0 .  oxyCODONE-acetaminophen (PERCOCET) 10-325 MG tablet, Take 1 tablet by mouth every 6 (six) hours as needed for pain., Disp: 20 tablet, Rfl: 0 .  QUEtiapine (SEROQUEL) 100 MG tablet, Take by mouth at bedtime., Disp: , Rfl:   Social History   Tobacco Use  Smoking Status Current Every Day Smoker  . Packs/day: 0.10  . Years: 14.00  . Pack years: 1.40  . Types: Cigarettes  Smokeless Tobacco Never Used    Allergies  Allergen Reactions  . Haldol [Haloperidol Lactate]     Throat swelling  . Amoxicillin Rash    Did it involve swelling of the face/tongue/throat, SOB, or low BP? Unknown Did it involve sudden or severe rash/hives, skin peeling, or any reaction on the inside of your mouth or nose? Yes Did you need to seek medical attention at a hospital or doctor's office? Yes When did it last happen?Childhood reaction. If all above answers are "NO", may proceed with cephalosporin use.    Objective:  There were no vitals filed for this visit. There is no height or weight on file to calculate BMI. Constitutional Well developed. Well nourished.  Vascular Dorsalis pedis pulses palpable bilaterally. Posterior tibial pulses palpable bilaterally. Capillary refill normal to all digits.  No cyanosis or clubbing noted. Pedal hair growth normal.  Neurologic Normal speech. Oriented to person, place, and time. Epicritic sensation to light touch grossly present bilaterally.  Dermatologic Nails well groomed and normal in appearance. No open wounds. No skin lesions.  Orthopedic:  Pain on palpation at the incision site with the pain along the course of the hardware.  Pain with range of motion of the ankle joint as well.  Mild deep intra-articular pain noted of the ankle joint with range of motion active and passive.  No pain at the Achilles tendon, peroneal tendon,  posterior tibial tendon, ATFL ligament.   Radiographs: 3 views of skeletally mature adult right ankle: Hardware is intact no signs of dehiscence or loosening noted.  No backing out noted.  Ankle mortise within normal limits.  Mild anterior arthritic changes noted.  No other bony abnormalities or subtalar joint arthritis noted. Assessment:   1. Sprain of right ankle, unspecified ligament, initial encounter    Plan:  Patient was evaluated and treated and all questions answered.  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  Even though radiographically is not appreciative of hardware coming loose however clinically I am able to palpate along the course of the incision site at the hardware that is causing her pain.  Given the amount of pain that she is having and a history of trying many conservative things further in cam boot immobilization steroid injection I believe patient will also benefit from ankle joint arthroscopy with debridement.  Patient agrees with the plan would like to proceed with removing the painful hardware with arthroscopy of the ankle joint to evaluate the osteoarthritic component of the joint.  I discussed with the patient in extensive detail that I am not sure if this will completely resolve her pain as once the ankle mortise has been damaged and will never go back to being the same way.  Patient states understanding and she understands that she would like to have the hardware removed. -Upon reviewing the x-rays it appears that patient's bone stock has good consolidation and I feel comfortable removing the hardware given that she is 6 months out.  At this point patient has adequate osseous healing where the hardware is no longer performing to hold the ankle within normal limits. -I plan on performing left painful hardware removal with arthroscopy of the ankle  joint.  Patient has [employer] hardware. -I discussed with the patient in extensive detail my postoperative protocol including weightbearing as tolerated in a cam boot after the surgery.  Patient will try to obtain a knee scooter as well. -Cam boot will be given at the surgery center. -Informed surgical risk consent was reviewed and read aloud to the patient.  I reviewed the films.  I have discussed my findings with the patient in great detail.  I have discussed all risks including but not limited to infection, stiffness, scarring, limp, disability, deformity, damage to blood vessels and nerves, numbness, poor healing, need for braces, arthritis, chronic pain, amputation, death.  All benefits and realistic expectations discussed in great detail.  I have made no promises as to the outcome.  I have provided realistic expectations.  I have offered the patient a 2nd opinion, which they have declined and assured me they preferred to proceed  despite the risks -A total of 47 minutes was spent in direct patient care as well as pre and post patient encounter activities.  This includes documentation as well as reviewing patient chart for labs, imaging, past medical, surgical, social, and family history as documented in the EMR.  I have reviewed medication allergies as documented in EMR.  I discussed the etiology of condition and treatment options from conservative to surgical care.  All risks and benefit of the treatment course was discussed in detail.  All questions were answered and return appointment was discussed.  Since the visit completed in an ambulatory/outpatient setting, the patient and/or parent/guardian has been advised to contact the providers office for worsening condition and seek medical treatment and/or call 911 if the patient deems either is necessary.    No follow-ups on file.

## 2019-09-21 NOTE — Patient Instructions (Signed)
Pre-Operative Instructions  Congratulations, you have decided to take an important step towards improving your quality of life.  You can be assured that the doctors and staff at [facility] will be with you every step of the way.  Here are some important things you should know:  1. Plan to be at the surgery center/hospital at least 1 (one) hour prior to your scheduled time, unless otherwise directed by the surgical center/hospital staff.  You must have a responsible adult accompany you, remain during the surgery and drive you home.  Make sure you have directions to the surgical center/hospital to ensure you arrive on time. 2. If you are having surgery at [facility] or [facility] hospitals, you will need a copy of your medical history and physical form from your family physician within one month prior to the date of surgery. We will give you a form for your primary physician to complete.  3. We make every effort to accommodate the date you request for surgery.  However, there are times where surgery dates or times have to be moved.  We will contact you as soon as possible if a change in schedule is required.   4. No aspirin/ibuprofen for one week before surgery.  If you are on aspirin, any non-steroidal anti-inflammatory medications (Mobic, Aleve, Ibuprofen) should not be taken seven (7) days prior to your surgery.  You make take Tylenol for pain prior to surgery.  5. Medications - If you are taking daily heart and blood pressure medications, seizure, reflux, allergy, asthma, anxiety, pain or diabetes medications, make sure you notify the surgery center/hospital before the day of surgery so they can tell you which medications you should take or avoid the day of surgery. 6. No food or drink after midnight the night before surgery unless directed otherwise by surgical center/hospital staff. 7. No alcoholic beverages 24-hours prior to surgery.  No smoking 24-hours prior or 24-hours after  surgery. 8. Wear loose pants or shorts. They should be loose enough to fit over bandages, boots, and casts. 9. Don't wear slip-on shoes. Sneakers are preferred. 10. Bring your boot with you to the surgery center/hospital.  Also bring crutches or a walker if your physician has prescribed it for you.  If you do not have this equipment, it will be provided for you after surgery. 11. If you have not been contacted by the surgery center/hospital by the day before your surgery, call to confirm the date and time of your surgery. 12. Leave-time from work may vary depending on the type of surgery you have.  Appropriate arrangements should be made prior to surgery with your employer. 13. Prescriptions will be provided immediately following surgery by your doctor.  Fill these as soon as possible after surgery and take the medication as directed. Pain medications will not be refilled on weekends and must be approved by the doctor. 14. Remove nail polish on the operative foot and avoid getting pedicures prior to surgery. 15. Wash the night before surgery.  The night before surgery wash the foot and leg well with water and the antibacterial soap provided. Be sure to pay special attention to beneath the toenails and in between the toes.  Wash for at least three (3) minutes. Rinse thoroughly with water and dry well with a towel.  Perform this wash unless told not to do so by your physician.  Enclosed: 1 Ice pack (please put in freezer the night before surgery)   1 Hibiclens skin cleaner     Pre-op instructions  If you have any questions regarding the instructions, please do not hesitate to call our office.  [place]: [address], [place], [place] [address] -- [phone]  [place]: [address] [place], [place] [address] -- [phone]  [place]: [address], [place], [place] [address] -- [phone]   Website: [email or web address] 

## 2019-09-24 ENCOUNTER — Telehealth: Payer: Self-pay | Admitting: *Deleted

## 2019-09-24 NOTE — Telephone Encounter (Signed)
"  I'm scheduled for surgery on the 26th.  He told me that if I'm having any other problems to let him know.  My pain is getting worse and my ankle is puffy.  Can he get me in for surgery sooner?"

## 2019-10-08 ENCOUNTER — Telehealth: Payer: Self-pay

## 2019-10-08 NOTE — Telephone Encounter (Signed)
DOS 10/18/19  REMOVAL FIXATION DEEP RT - 20680 ANKLE ARTHROSCOPY RT - 16109  [facility] EFFECTIVE DATE - 09/23/2019  SPOKE TO NATAYA J AT [facility] STATED NO PRECERT IS REQUIRED FOR CPT 20680 & [phone]. SHE STATED THEY DO NOT GIVE CALL REF #'S.

## 2019-10-18 ENCOUNTER — Telehealth: Payer: Self-pay | Admitting: *Deleted

## 2019-10-18 ENCOUNTER — Telehealth: Payer: Self-pay | Admitting: Podiatry

## 2019-10-18 ENCOUNTER — Encounter: Payer: Self-pay | Admitting: Podiatry

## 2019-10-18 DIAGNOSIS — M13871 Other specified arthritis, right ankle and foot: Secondary | ICD-10-CM

## 2019-10-18 MED ORDER — IBUPROFEN 800 MG PO TABS: 800.0000 mg | ORAL_TABLET | Freq: Four times a day (QID) | ORAL | 1 refills | Status: DC | PRN

## 2019-10-18 MED ORDER — OXYCODONE-ACETAMINOPHEN 10-325 MG PO TABS: 1.0000 | ORAL_TABLET | Freq: Four times a day (QID) | ORAL | 0 refills | Status: DC | PRN

## 2019-10-18 NOTE — Addendum Note (Signed)
Addended by: Nicholes Rough on: 10/18/2019 07:19 AM   Modules accepted: Orders

## 2019-10-18 NOTE — Addendum Note (Signed)
Addended byLilian Kapur, Ritaj Dullea R on: 10/18/2019 02:29 PM   Modules accepted: Orders

## 2019-10-18 NOTE — Telephone Encounter (Signed)
Done

## 2019-10-18 NOTE — Telephone Encounter (Signed)
Pt called needing [facility] pain [facility]  [employer] [place]

## 2019-10-18 NOTE — Telephone Encounter (Signed)
Pt states the pain medication was sent to the wrong pharmacy should be sent to [facility] [address] I changed Pharmacy to requested [facility] [phone]. I sent Secure Chat message to doctor on call Dr. Lilian Kapur to change the surgery medications to [facility] [phone].

## 2019-10-18 NOTE — Addendum Note (Signed)
Addended by: Nicholes Rough on: 10/18/2019 04:30 PM   Modules accepted: Orders

## 2019-10-19 ENCOUNTER — Telehealth: Payer: Self-pay | Admitting: Podiatry

## 2019-10-19 MED ORDER — OXYCODONE-ACETAMINOPHEN 10-325 MG PO TABS: 1.0000 | ORAL_TABLET | Freq: Four times a day (QID) | ORAL | 0 refills | Status: DC | PRN

## 2019-10-19 MED ORDER — OXYCODONE-ACETAMINOPHEN 10-325 MG PO TABS: 2.0000 | ORAL_TABLET | Freq: Four times a day (QID) | ORAL | 0 refills | Status: DC | PRN

## 2019-10-19 NOTE — Addendum Note (Signed)
Addended by: Nicholes Rough on: 10/19/2019 09:55 AM   Modules accepted: Orders

## 2019-10-19 NOTE — Telephone Encounter (Signed)
Pt called wanting to know if she can remove her boot when in bed or when can she take the boot off

## 2019-10-19 NOTE — Addendum Note (Signed)
Addended by: Nicholes Rough on: 10/19/2019 01:56 PM   Modules accepted: Orders

## 2019-10-19 NOTE — Telephone Encounter (Signed)
Pt called stating she is needing her prescription updated to say,Take 2 tablets by mouth every 6 hours instead of 1 tablet every 6 hours. Pt states that the one tablet is not enough to help with her pain.   Please give patient a call.

## 2019-10-20 ENCOUNTER — Telehealth: Payer: Self-pay | Admitting: Podiatry

## 2019-10-20 DIAGNOSIS — T8484XA Pain due to internal orthopedic prosthetic devices, implants and grafts, initial encounter: Secondary | ICD-10-CM

## 2019-10-20 DIAGNOSIS — M19079 Primary osteoarthritis, unspecified ankle and foot: Secondary | ICD-10-CM

## 2019-10-20 NOTE — Telephone Encounter (Signed)
The patient is requesting a muscle relaxer and also is inquiring about the walker that is would like to have.

## 2019-10-21 ENCOUNTER — Telehealth: Payer: Self-pay | Admitting: Podiatry

## 2019-10-21 MED ORDER — CYCLOBENZAPRINE HCL 10 MG PO TABS: 10.0000 mg | ORAL_TABLET | Freq: Three times a day (TID) | ORAL | 0 refills | Status: DC | PRN

## 2019-10-21 NOTE — Addendum Note (Signed)
Addended by: Nicholes Rough on: 10/21/2019 12:13 PM   Modules accepted: Orders

## 2019-10-21 NOTE — Telephone Encounter (Signed)
Hi Cozell,  I have sent the Flexeril to her pharmacy and as for the walker when I see her I can write her prescription.  Thank you

## 2019-10-21 NOTE — Telephone Encounter (Signed)
Pt would like an update on getting a walker.

## 2019-10-21 NOTE — Telephone Encounter (Signed)
Pt states that pharmacy told her that they need a pre-authorization for her medication.  Fax to: [phone]. Pt is waiting on it.

## 2019-10-22 NOTE — Addendum Note (Signed)
Addended by: Geraldine Contras D on: 10/22/2019 09:06 AM   Modules accepted: Orders

## 2019-10-22 NOTE — Telephone Encounter (Signed)
I spoke with patient, she did pick up her prescription for Flexeril and requested a prescription for a walker.  Script has been printed and left at front desk for patient to pick up

## 2019-10-23 ENCOUNTER — Encounter: Payer: Self-pay | Admitting: Podiatry

## 2019-10-23 ENCOUNTER — Telehealth: Payer: Self-pay | Admitting: Podiatry

## 2019-10-23 MED ORDER — OXYCODONE-ACETAMINOPHEN 10-325 MG PO TABS: 1.0000 | ORAL_TABLET | Freq: Four times a day (QID) | ORAL | 0 refills | Status: DC | PRN

## 2019-10-23 MED ORDER — OXYCODONE-ACETAMINOPHEN 10-325 MG PO TABS: 1.0000 | ORAL_TABLET | Freq: Four times a day (QID) | ORAL | 0 refills | Status: AC | PRN
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
## 2019-10-26 ENCOUNTER — Ambulatory Visit (INDEPENDENT_AMBULATORY_CARE_PROVIDER_SITE_OTHER): Payer: Medicaid Other

## 2019-10-26 ENCOUNTER — Other Ambulatory Visit: Payer: Self-pay

## 2019-10-26 ENCOUNTER — Ambulatory Visit (INDEPENDENT_AMBULATORY_CARE_PROVIDER_SITE_OTHER): Payer: Medicaid Other | Admitting: Podiatry

## 2019-10-26 ENCOUNTER — Encounter: Payer: Self-pay | Admitting: Podiatry

## 2019-10-26 DIAGNOSIS — T8484XA Pain due to internal orthopedic prosthetic devices, implants and grafts, initial encounter: Secondary | ICD-10-CM

## 2019-10-26 DIAGNOSIS — Z9889 Other specified postprocedural states: Secondary | ICD-10-CM

## 2019-10-26 DIAGNOSIS — M19079 Primary osteoarthritis, unspecified ankle and foot: Secondary | ICD-10-CM

## 2019-10-26 MED ORDER — OXYCODONE-ACETAMINOPHEN 10-325 MG PO TABS: 1.0000 | ORAL_TABLET | Freq: Four times a day (QID) | ORAL | 0 refills | Status: DC | PRN

## 2019-10-26 NOTE — Progress Notes (Signed)
Subjective:  Patient ID: Madison Singleton, female    DOB: 1990-09-29,  MRN: [id number]  Chief Complaint  Patient presents with  . Routine Post Op    POV #1 DOS 10/18/19 RT ANKLE REMOVAL OF PAINFUL HARDWARE, RT ANKLE ARTHROSCOPY     29 y.o. female returns for post-op check.  Patient doing well.  The staples are intact.  The hardware has been staying intact as well.  Denies any other acute complaints.  She states she needs a refill on pain medication.  Overall she has been ambulating with a cam boot.  Review of Systems: Negative except as noted in the HPI. Denies N/V/F/Ch.  Past Medical History:  Diagnosis Date  . Anxiety    NO MEDS  . Breast mass   . Breast tenderness in female    3;00 x3 weeks  . Depression    NO MEDS  . GERD (gastroesophageal reflux disease)    OCC-NO MEDS  . Headache    HAS RECENTLY STARTED HAVING HA'S MORE FREQUENTLY    Current Outpatient Medications:  .  cyclobenzaprine (FLEXERIL) 10 MG tablet, Take 1 tablet (10 mg total) by mouth 3 (three) times daily as needed for muscle spasms., Disp: 30 tablet, Rfl: 0 .  cyclobenzaprine (FLEXERIL) 5 MG tablet, Take 1 tablet (5 mg total) by mouth 3 (three) times daily as needed., Disp: 15 tablet, Rfl: 0 .  docusate sodium (COLACE) 100 MG capsule, Take 1 capsule (100 mg total) by mouth daily as needed., Disp: 30 capsule, Rfl: 2 .  ibuprofen (ADVIL) 800 MG tablet, Take 1 tablet (800 mg total) by mouth every 6 (six) hours as needed., Disp: 60 tablet, Rfl: 1 .  ibuprofen (ADVIL) 800 MG tablet, Take 1 tablet (800 mg total) by mouth every 6 (six) hours as needed., Disp: 60 tablet, Rfl: 1 .  oxyCODONE-acetaminophen (PERCOCET) 10-325 MG tablet, Take 1 tablet by mouth every 6 (six) hours as needed for up to 3 days for pain., Disp: 12 tablet, Rfl: 0 .  QUEtiapine (SEROQUEL) 100 MG tablet, Take by mouth at bedtime., Disp: , Rfl:  .  oxyCODONE-acetaminophen (PERCOCET) 10-325 MG tablet, Take 1 tablet by mouth every 6 (six) hours as  needed for up to 8 days for pain., Disp: 30 tablet, Rfl: 0  Social History   Tobacco Use  Smoking Status Current Every Day Smoker  . Packs/day: 0.10  . Years: 14.00  . Pack years: 1.40  . Types: Cigarettes  Smokeless Tobacco Never Used    Allergies  Allergen Reactions  . Haldol [Haloperidol Lactate]     Throat swelling  . Amoxicillin Rash    Did it involve swelling of the face/tongue/throat, SOB, or low BP? Unknown Did it involve sudden or severe rash/hives, skin peeling, or any reaction on the inside of your mouth or nose? Yes Did you need to seek medical attention at a hospital or doctor's office? Yes When did it last happen?Childhood reaction. If all above answers are "NO", may proceed with cephalosporin use.    Objective:  There were no vitals filed for this visit. There is no height or weight on file to calculate BMI. Constitutional Well developed. Well nourished.  Vascular Foot warm and well perfused. Capillary refill normal to all digits.   Neurologic Normal speech. Oriented to person, place, and time. Epicritic sensation to light touch grossly present bilaterally.  Dermatologic Skin healing well without signs of infection. Skin edges well coapted without signs of infection.  Orthopedic: Tenderness to palpation noted about  the surgical site.   Radiographs: 3 views of skeletally mature adult right ankle: No hardware noted.  Good ankle mortise noted.  Mild arthritic changes noted to the ankle joint Assessment:   1. Painful orthopaedic hardware (HCC)   2. Arthritis of ankle   3. S/P foot surgery    Plan:  Patient was evaluated and treated and all questions answered.  S/p foot surgery right -Progressing as expected post-operatively. -XR: See above -WB Status: Weightbearing as tolerated in cam boot -Sutures: Staples intact.  No signs of dehiscence noted.  No clinical signs of infection noted. -Medications: Percocet -Foot redressed.  No follow-ups on file.

## 2019-10-27 ENCOUNTER — Telehealth: Payer: Self-pay | Admitting: Podiatry

## 2019-10-27 NOTE — Telephone Encounter (Signed)
Ms. Kontz is inquiring about the prescription that Dr. Lilian Kapur wrote over the weekend. The prescribed 1 pill every 6 hrs instead of 2 pills. Please adjust the prescription back to two pills every 6 hrs.

## 2019-10-28 ENCOUNTER — Other Ambulatory Visit: Payer: Self-pay | Admitting: Podiatry

## 2019-10-28 MED ORDER — OXYCODONE-ACETAMINOPHEN 10-325 MG PO TABS: 1.0000 | ORAL_TABLET | Freq: Four times a day (QID) | ORAL | 0 refills | Status: DC | PRN

## 2019-10-28 NOTE — Progress Notes (Signed)
Received call from Robin at [facility] and the patient called and is not able to get the medication at [facility]. Pharmacist states it was never picked up at [facility] and the patient is asking for th rx to be transferred back. I did this and called [facility] to cancel the Rx and the pharmacist, Zella Ball, states she will call in the morning when they open. Left VM at [facility] as they were closed.

## 2019-10-28 NOTE — Progress Notes (Signed)
I called and spoke to the pharmacist as they called. [facility] is [facility] and the [facility] pharmacist is not able to fill the percocet. They cancelled the order and I sent it to [facility].

## 2019-11-01 ENCOUNTER — Other Ambulatory Visit: Payer: Self-pay | Admitting: Podiatry

## 2019-11-01 ENCOUNTER — Telehealth: Payer: Self-pay | Admitting: Podiatry

## 2019-11-01 ENCOUNTER — Encounter: Payer: Self-pay | Admitting: Podiatry

## 2019-11-01 MED ORDER — KETOROLAC TROMETHAMINE 10 MG PO TABS: 10.0000 mg | ORAL_TABLET | Freq: Four times a day (QID) | ORAL | 0 refills | Status: DC | PRN

## 2019-11-01 MED ORDER — OXYCODONE-ACETAMINOPHEN 10-325 MG PO TABS: 1.0000 | ORAL_TABLET | Freq: Four times a day (QID) | ORAL | 0 refills | Status: DC | PRN

## 2019-11-01 MED ORDER — GABAPENTIN 400 MG PO CAPS: 400.0000 mg | ORAL_CAPSULE | Freq: Three times a day (TID) | ORAL | 1 refills | Status: DC

## 2019-11-01 NOTE — Telephone Encounter (Signed)
[facility] called stating that pt needs a prior auth on pain medication the pt has the new medicaid plan but they are stricter then the plans before pt is unable to get her pain medication

## 2019-11-01 NOTE — Telephone Encounter (Signed)
I spoke with Madison Singleton at [facility], informed her that Medicaid denied covering pain medication, that Dr. Allena Katz had sent in a script for Tordol and Gabapentin.   I spoke with patient and informed her of denial and explained that she can pay out of pocket for pain medication or try the Gabapentin and Tordol.  Patient declined the two medications and said she would pay out of pocket for her pain medication.   She will contact the pharmacy to see if pain medication is ready for pick up

## 2019-11-02 ENCOUNTER — Encounter: Payer: Medicaid Other | Admitting: Podiatry

## 2019-11-03 ENCOUNTER — Other Ambulatory Visit: Payer: Self-pay | Admitting: Family Medicine

## 2019-11-03 DIAGNOSIS — N644 Mastodynia: Secondary | ICD-10-CM

## 2019-11-04 ENCOUNTER — Encounter: Payer: Self-pay | Admitting: Podiatry

## 2019-11-04 ENCOUNTER — Encounter: Payer: Medicaid Other | Admitting: Podiatry

## 2019-11-07 ENCOUNTER — Other Ambulatory Visit: Payer: Self-pay | Admitting: Podiatry

## 2019-11-07 ENCOUNTER — Encounter: Payer: Self-pay | Admitting: Podiatry

## 2019-11-08 MED ORDER — CYCLOBENZAPRINE HCL 10 MG PO TABS: 10.0000 mg | ORAL_TABLET | Freq: Three times a day (TID) | ORAL | 0 refills | Status: DC | PRN

## 2019-11-08 MED ORDER — OXYCODONE-ACETAMINOPHEN 10-325 MG PO TABS: 1.0000 | ORAL_TABLET | Freq: Four times a day (QID) | ORAL | 0 refills | Status: DC | PRN

## 2019-11-09 ENCOUNTER — Encounter: Payer: Medicaid Other | Admitting: Podiatry

## 2019-11-15 ENCOUNTER — Encounter: Payer: Self-pay | Admitting: Podiatry

## 2019-11-15 ENCOUNTER — Telehealth: Payer: Self-pay

## 2019-11-15 ENCOUNTER — Other Ambulatory Visit: Payer: Self-pay | Admitting: Podiatry

## 2019-11-15 MED ORDER — OXYCODONE-ACETAMINOPHEN 10-325 MG PO TABS: 1.0000 | ORAL_TABLET | Freq: Four times a day (QID) | ORAL | 0 refills | Status: AC | PRN

## 2019-11-15 NOTE — Telephone Encounter (Signed)
Thank you :)

## 2019-11-15 NOTE — Telephone Encounter (Signed)
Done

## 2019-11-15 NOTE — Telephone Encounter (Signed)
Pt would like a refill on pain medication. Also pt appointment was reschedule due to pt is in quarantine.  Please advise for refill.

## 2019-11-16 ENCOUNTER — Encounter: Payer: Medicaid Other | Admitting: Podiatry

## 2019-11-16 MED ORDER — OXYCODONE-ACETAMINOPHEN 10-325 MG PO TABS: 1.0000 | ORAL_TABLET | Freq: Four times a day (QID) | ORAL | 0 refills | Status: DC | PRN

## 2019-11-16 NOTE — Addendum Note (Signed)
Addended by: Nicholes Rough on: 11/16/2019 07:07 AM   Modules accepted: Orders

## 2019-11-17 ENCOUNTER — Telehealth: Payer: Self-pay | Admitting: Podiatrist

## 2019-11-17 NOTE — Telephone Encounter (Signed)
Called back Robin at [facility] and asked her to cancel the most recent oxycodone prescription.  Patient picked up a 12 day supply of oxycodone ([facility] day [facility] day [facility]) so she would not be due for a refill until November 26, 2019.

## 2019-11-17 NOTE — Telephone Encounter (Signed)
Robin from [facility] pharmacy called and states this patient filled a 5 day supply of Oxycodone on 8/23 at this [facility] and on the same dayfilled a 7 day prescription of the same medication at [facility]  ? Is would you like to cancel your prescription? Thanks!

## 2019-11-22 ENCOUNTER — Other Ambulatory Visit: Payer: Self-pay | Admitting: Podiatry

## 2019-11-22 ENCOUNTER — Encounter: Payer: Self-pay | Admitting: Podiatry

## 2019-11-23 ENCOUNTER — Encounter: Payer: Self-pay | Admitting: Podiatry

## 2019-11-23 ENCOUNTER — Ambulatory Visit (INDEPENDENT_AMBULATORY_CARE_PROVIDER_SITE_OTHER): Payer: Medicaid Other

## 2019-11-23 ENCOUNTER — Ambulatory Visit (INDEPENDENT_AMBULATORY_CARE_PROVIDER_SITE_OTHER): Payer: Medicaid Other | Admitting: Podiatry

## 2019-11-23 ENCOUNTER — Other Ambulatory Visit: Payer: Self-pay

## 2019-11-23 DIAGNOSIS — T8484XA Pain due to internal orthopedic prosthetic devices, implants and grafts, initial encounter: Secondary | ICD-10-CM

## 2019-11-23 DIAGNOSIS — S93491A Sprain of other ligament of right ankle, initial encounter: Secondary | ICD-10-CM

## 2019-11-23 DIAGNOSIS — Z9889 Other specified postprocedural states: Secondary | ICD-10-CM

## 2019-11-23 MED ORDER — OXYCODONE-ACETAMINOPHEN 10-325 MG PO TABS
1.0000 | ORAL_TABLET | Freq: Four times a day (QID) | ORAL | 0 refills | Status: DC | PRN
Start: 2019-11-23 — End: 2019-11-24

## 2019-11-23 NOTE — Telephone Encounter (Signed)
Please Advise

## 2019-11-24 ENCOUNTER — Encounter: Payer: Self-pay | Admitting: Podiatry

## 2019-11-24 ENCOUNTER — Telehealth: Payer: Self-pay | Admitting: Podiatry

## 2019-11-24 MED ORDER — OXYCODONE-ACETAMINOPHEN 10-325 MG PO TABS
1.0000 | ORAL_TABLET | ORAL | 0 refills | Status: AC | PRN
Start: 2019-11-24 — End: 2019-11-29

## 2019-11-24 MED ORDER — OXYCODONE-ACETAMINOPHEN 10-325 MG PO TABS
1.0000 | ORAL_TABLET | Freq: Four times a day (QID) | ORAL | 0 refills | Status: DC | PRN
Start: 2019-11-24 — End: 2019-12-02

## 2019-11-24 NOTE — Telephone Encounter (Signed)
Robin at [facility] pharmacy states patient's insurance will only pay for 5 day supply of oxycodone. Anything longer than 5 days requires authorization from insurance. Contact is Robin at [employer] at [phone].

## 2019-11-24 NOTE — Progress Notes (Signed)
Subjective:  Patient ID: Madison Singleton, female    DOB: [birth year],  MRN: [id number]  Chief Complaint  Patient presents with  . Routine Post Op    DOS 10/18/19 RT ANKLE REMOVAL OF PAINFUL HARDWARE, RT ANKLE ARTHROSCOPY     29 y.o. female returns for post-op check.  Patient doing well.  The staples are intact.  The hardware has been staying intact as well.  Denies any other acute complaints.  She states she needs a refill on pain medication.  Overall she has been ambulating with a cam boot.  Review of Systems: Negative except as noted in the HPI. Denies N/V/F/Ch.  Past Medical History:  Diagnosis Date  . Anxiety    NO MEDS  . Breast mass   . Breast tenderness in female    3;00 x3 weeks  . Depression    NO MEDS  . GERD (gastroesophageal reflux disease)    OCC-NO MEDS  . Headache    HAS RECENTLY STARTED HAVING HA'S MORE FREQUENTLY    Current Outpatient Medications:  .  cyclobenzaprine (FLEXERIL) 10 MG tablet, Take 1 tablet (10 mg total) by mouth 3 (three) times daily as needed for muscle spasms., Disp: 30 tablet, Rfl: 0 .  cyclobenzaprine (FLEXERIL) 5 MG tablet, Take 1 tablet (5 mg total) by mouth 3 (three) times daily as needed., Disp: 15 tablet, Rfl: 0 .  docusate sodium (COLACE) 100 MG capsule, Take 1 capsule (100 mg total) by mouth daily as needed., Disp: 30 capsule, Rfl: 2 .  gabapentin (NEURONTIN) 400 MG capsule, Take 1 capsule (400 mg total) by mouth 3 (three) times daily., Disp: 30 capsule, Rfl: 1 .  ibuprofen (ADVIL) 800 MG tablet, Take 1 tablet (800 mg total) by mouth every 6 (six) hours as needed., Disp: 60 tablet, Rfl: 1 .  ibuprofen (ADVIL) 800 MG tablet, Take 1 tablet (800 mg total) by mouth every 6 (six) hours as needed., Disp: 60 tablet, Rfl: 1 .  ketorolac (TORADOL) 10 MG tablet, Take 1 tablet (10 mg total) by mouth every 6 (six) hours as needed., Disp: 20 tablet, Rfl: 0 .  oxyCODONE-acetaminophen (PERCOCET) 10-325 MG tablet, Take 1 tablet by mouth every 6 (six)  hours as needed for up to 8 days for pain., Disp: 30 tablet, Rfl: 0 .  oxyCODONE-acetaminophen (PERCOCET) 10-325 MG tablet, Take 1 tablet by mouth every 6 (six) hours as needed for up to 8 days for pain., Disp: 30 tablet, Rfl: 0 .  QUEtiapine (SEROQUEL) 100 MG tablet, Take by mouth at bedtime., Disp: , Rfl:   Social History   Tobacco Use  Smoking Status Current Every Day Smoker  . Packs/day: 0.10  . Years: 14.00  . Pack years: 1.40  . Types: Cigarettes  Smokeless Tobacco Never Used    Allergies  Allergen Reactions  . Haldol [Haloperidol Lactate]     Throat swelling  . Amoxicillin Rash    Did it involve swelling of the face/tongue/throat, SOB, or low BP? Unknown Did it involve sudden or severe rash/hives, skin peeling, or any reaction on the inside of your mouth or nose? Yes Did you need to seek medical attention at a hospital or doctor's office? Yes When did it last happen?Childhood reaction. If all above answers are "NO", may proceed with cephalosporin use.    Objective:  There were no vitals filed for this visit. There is no height or weight on file to calculate BMI. Constitutional Well developed. Well nourished.  Vascular Foot warm and well perfused. Capillary  refill normal to all digits.   Neurologic Normal speech. Oriented to person, place, and time. Epicritic sensation to light touch grossly present bilaterally.  Dermatologic Skin healing well without signs of infection. Skin edges well coapted without signs of infection.  Orthopedic: Tenderness to palpation noted about the surgical site.   Radiographs: 3 views of skeletally mature adult right ankle: No hardware noted.  Good ankle mortise noted.  Mild arthritic changes noted to the ankle joint Assessment:   1. Painful orthopaedic hardware (HCC)   2. S/P foot surgery    Plan:  Patient was evaluated and treated and all questions answered.  S/p foot surgery right -Progressing as expected post-operatively. -XR:  See above -WB Status: Patient can begin transitioning to regular sneakers. -Sutures: Staples removed.  No signs of dehiscence noted.  No complication noted. -Medications: Percocet -Foot redressed.  No follow-ups on file.

## 2019-11-24 NOTE — Addendum Note (Signed)
Addended by: Nicholes Rough on: 11/24/2019 09:40 AM   Modules accepted: Orders

## 2019-11-24 NOTE — Telephone Encounter (Signed)
Done I sent in a new prescription for 5 days.  Thank you

## 2019-12-01 ENCOUNTER — Telehealth: Payer: Self-pay

## 2019-12-01 ENCOUNTER — Telehealth: Payer: Self-pay | Admitting: Podiatry

## 2019-12-01 NOTE — Telephone Encounter (Signed)
Patient is requesting a refill of her pain meds, she tried wearing shoes and is expericing swelling ,pain and bruising. The surgiacial site in tender tothe touch. Please advise. The patient is requesting a return call. [phone]

## 2019-12-01 NOTE — Telephone Encounter (Signed)
Patient called to request a refill of pain medication and to inform Dr. Allena Katz that she is experiencing swelling pain and bruising after attempting to wear shoes again.

## 2019-12-02 ENCOUNTER — Other Ambulatory Visit: Payer: Self-pay | Admitting: Family Medicine

## 2019-12-02 ENCOUNTER — Other Ambulatory Visit: Payer: Self-pay | Admitting: Podiatry

## 2019-12-02 ENCOUNTER — Encounter: Payer: Self-pay | Admitting: Podiatry

## 2019-12-02 DIAGNOSIS — N644 Mastodynia: Secondary | ICD-10-CM

## 2019-12-02 MED ORDER — OXYCODONE-ACETAMINOPHEN 10-325 MG PO TABS
1.0000 | ORAL_TABLET | Freq: Four times a day (QID) | ORAL | 0 refills | Status: AC | PRN
Start: 2019-12-02 — End: 2019-12-10

## 2019-12-02 MED ORDER — OXYCODONE-ACETAMINOPHEN 10-325 MG PO TABS: 1.0000 | ORAL_TABLET | Freq: Three times a day (TID) | ORAL | 0 refills | Status: AC | PRN

## 2019-12-07 ENCOUNTER — Encounter: Payer: Self-pay | Admitting: Podiatry

## 2019-12-10 ENCOUNTER — Ambulatory Visit
Admission: RE | Admit: 2019-12-10 | Discharge: 2019-12-10 | Disposition: A | Discharge status: Home / Self Care | Payer: Medicaid Other | Source: Ambulatory Visit | Attending: Family Medicine | Admitting: Family Medicine

## 2019-12-10 ENCOUNTER — Other Ambulatory Visit: Payer: Self-pay

## 2019-12-10 DIAGNOSIS — N644 Mastodynia: Secondary | ICD-10-CM | POA: Insufficient documentation

## 2019-12-12 ENCOUNTER — Encounter: Payer: Self-pay | Admitting: Podiatry

## 2019-12-15 ENCOUNTER — Telehealth: Payer: Self-pay | Admitting: Podiatry

## 2019-12-15 NOTE — Telephone Encounter (Signed)
Pt is calling because she has been requesting pain medication and no one has called her back.

## 2019-12-16 MED ORDER — OXYCODONE-ACETAMINOPHEN 10-325 MG PO TABS: 1.0000 | ORAL_TABLET | ORAL | 0 refills | Status: DC | PRN

## 2019-12-16 NOTE — Telephone Encounter (Signed)
Let her know that this will be the last prescription. She will need to transition out of it.

## 2019-12-16 NOTE — Addendum Note (Signed)
Addended by: Nicholes Rough on: 12/16/2019 05:25 AM   Modules accepted: Orders

## 2019-12-16 NOTE — Telephone Encounter (Signed)
Patient notified of medication via voice mail and informed that this will be last script for Percocet per Dr. Allena Katz

## 2019-12-21 ENCOUNTER — Encounter: Payer: Self-pay | Admitting: Podiatry

## 2019-12-22 ENCOUNTER — Telehealth: Payer: Self-pay

## 2019-12-22 MED ORDER — OXYCODONE-ACETAMINOPHEN 5-325 MG PO TABS: 1.0000 | ORAL_TABLET | ORAL | 0 refills | Status: DC | PRN

## 2019-12-22 NOTE — Telephone Encounter (Signed)
Patient is calling again for refill on her pain medication.  Please send in lower dose to pharmacy.  Thanks!

## 2019-12-22 NOTE — Addendum Note (Signed)
Addended by: Nicholes Rough on: 12/22/2019 06:54 PM   Modules accepted: Orders

## 2019-12-28 ENCOUNTER — Other Ambulatory Visit: Payer: Self-pay | Admitting: Podiatry

## 2019-12-28 ENCOUNTER — Encounter: Payer: Self-pay | Admitting: Podiatry

## 2019-12-29 MED ORDER — OXYCODONE-ACETAMINOPHEN 5-325 MG PO TABS: 1.0000 | ORAL_TABLET | ORAL | 0 refills | Status: DC | PRN

## 2020-01-03 ENCOUNTER — Other Ambulatory Visit: Payer: Self-pay | Admitting: Podiatry

## 2020-01-03 ENCOUNTER — Telehealth: Payer: Self-pay | Admitting: Podiatry

## 2020-01-03 NOTE — Telephone Encounter (Signed)
Patient wants a refill on her pain medication.

## 2020-01-04 ENCOUNTER — Ambulatory Visit: Payer: Medicaid Other | Admitting: Podiatry

## 2020-01-04 MED ORDER — OXYCODONE-ACETAMINOPHEN 5-325 MG PO TABS: 1.0000 | ORAL_TABLET | ORAL | 0 refills | Status: DC | PRN

## 2020-01-06 ENCOUNTER — Ambulatory Visit: Payer: Medicaid Other | Admitting: Podiatry

## 2020-01-09 ENCOUNTER — Other Ambulatory Visit: Payer: Self-pay | Admitting: Podiatry

## 2020-01-09 ENCOUNTER — Encounter: Payer: Self-pay | Admitting: Podiatry

## 2020-01-10 ENCOUNTER — Encounter: Payer: Self-pay | Admitting: Podiatry

## 2020-01-11 ENCOUNTER — Other Ambulatory Visit: Payer: Self-pay | Admitting: Podiatry

## 2020-01-12 ENCOUNTER — Encounter: Payer: Self-pay | Admitting: Podiatry

## 2020-01-12 NOTE — Telephone Encounter (Signed)
Spoke with pt this morning and she would like this refill to be sent to [facility] on [address].

## 2020-01-18 ENCOUNTER — Ambulatory Visit (INDEPENDENT_AMBULATORY_CARE_PROVIDER_SITE_OTHER): Payer: Medicaid Other | Admitting: Podiatry

## 2020-01-18 ENCOUNTER — Other Ambulatory Visit: Payer: Self-pay

## 2020-01-18 ENCOUNTER — Ambulatory Visit (INDEPENDENT_AMBULATORY_CARE_PROVIDER_SITE_OTHER): Payer: Medicaid Other

## 2020-01-18 ENCOUNTER — Encounter: Payer: Self-pay | Admitting: Podiatry

## 2020-01-18 DIAGNOSIS — M19071 Primary osteoarthritis, right ankle and foot: Secondary | ICD-10-CM | POA: Diagnosis not present

## 2020-01-18 DIAGNOSIS — T8484XA Pain due to internal orthopedic prosthetic devices, implants and grafts, initial encounter: Secondary | ICD-10-CM | POA: Diagnosis not present

## 2020-01-18 DIAGNOSIS — Z9889 Other specified postprocedural states: Secondary | ICD-10-CM

## 2020-01-18 DIAGNOSIS — M19079 Primary osteoarthritis, unspecified ankle and foot: Secondary | ICD-10-CM

## 2020-01-18 MED ORDER — OXYCODONE-ACETAMINOPHEN 5-325 MG PO TABS: 1.0000 | ORAL_TABLET | ORAL | 0 refills | Status: DC | PRN

## 2020-01-19 ENCOUNTER — Encounter: Payer: Self-pay | Admitting: Podiatry

## 2020-01-19 NOTE — Progress Notes (Signed)
Subjective:  Patient ID: Madison Singleton, female    DOB: Mar 14, 1991,  MRN: [id number]  Chief Complaint  Patient presents with  . Routine Post Op    DOS 10/18/19 RT ANKLE REMOVAL OF PAINFUL HARDWARE, RT ANKLE ARTHROSCOPY    29 y.o. female presents with the above complaint.  Patient presents with continuous right ankle pain.  Patient states is painful to touch.  She states that removing the hardware helped a little bit but after long day of working her pain comes back.  She denies any other acute complaints.  She still continues to have a lot of pain.  No as I discussed with her prior that her arthritis in the ankle pain is pretty severe in nature after we did a scope.  If there is no improvement we will need to get a further starting as well as discuss possible future surgical options.  She denies any other acute complaints.   Review of Systems: Negative except as noted in the HPI. Denies N/V/F/Ch.  Past Medical History:  Diagnosis Date  . Anxiety    NO MEDS  . Breast mass   . Breast tenderness in female    3;00 x3 weeks  . Depression    NO MEDS  . GERD (gastroesophageal reflux disease)    OCC-NO MEDS  . Headache    HAS RECENTLY STARTED HAVING HA'S MORE FREQUENTLY    Current Outpatient Medications:  .  cyclobenzaprine (FLEXERIL) 10 MG tablet, Take 1 tablet by mouth three times daily as needed for muscle spasm, Disp: 30 tablet, Rfl: 0 .  cyclobenzaprine (FLEXERIL) 5 MG tablet, Take 1 tablet (5 mg total) by mouth 3 (three) times daily as needed., Disp: 15 tablet, Rfl: 0 .  docusate sodium (COLACE) 100 MG capsule, Take 1 capsule (100 mg total) by mouth daily as needed., Disp: 30 capsule, Rfl: 2 .  gabapentin (NEURONTIN) 400 MG capsule, Take 1 capsule (400 mg total) by mouth 3 (three) times daily., Disp: 30 capsule, Rfl: 1 .  ibuprofen (ADVIL) 800 MG tablet, Take 1 tablet (800 mg total) by mouth every 6 (six) hours as needed., Disp: 60 tablet, Rfl: 1 .  ibuprofen (ADVIL) 800 MG tablet,  Take 1 tablet (800 mg total) by mouth every 6 (six) hours as needed., Disp: 60 tablet, Rfl: 1 .  ketorolac (TORADOL) 10 MG tablet, Take 1 tablet (10 mg total) by mouth every 6 (six) hours as needed., Disp: 20 tablet, Rfl: 0 .  oxyCODONE-acetaminophen (PERCOCET) 10-325 MG tablet, Take 1 tablet by mouth every 4 (four) hours as needed for pain., Disp: 30 tablet, Rfl: 0 .  oxyCODONE-acetaminophen (PERCOCET) 5-325 MG tablet, Take 1-2 tablets by mouth every 4 (four) hours as needed for severe pain., Disp: 28 tablet, Rfl: 0 .  oxyCODONE-acetaminophen (PERCOCET) 5-325 MG tablet, Take 1-2 tablets by mouth every 4 (four) hours as needed for severe pain., Disp: 15 tablet, Rfl: 0 .  oxyCODONE-acetaminophen (PERCOCET) 5-325 MG tablet, Take 1-2 tablets by mouth every 4 (four) hours as needed for up to 5 days for severe pain., Disp: 30 tablet, Rfl: 0 .  QUEtiapine (SEROQUEL) 100 MG tablet, Take by mouth at bedtime., Disp: , Rfl:   Social History   Tobacco Use  Smoking Status Current Every Day Smoker  . Packs/day: 0.10  . Years: 14.00  . Pack years: 1.40  . Types: Cigarettes  Smokeless Tobacco Never Used    Allergies  Allergen Reactions  . Haldol [Haloperidol Lactate]     Throat swelling  .  Amoxicillin Rash    Did it involve swelling of the face/tongue/throat, SOB, or low BP? Unknown Did it involve sudden or severe rash/hives, skin peeling, or any reaction on the inside of your mouth or nose? Yes Did you need to seek medical attention at a hospital or doctor's office? Yes When did it last happen?Childhood reaction. If all above answers are "NO", may proceed with cephalosporin use.    Objective:  There were no vitals filed for this visit. There is no height or weight on file to calculate BMI. Constitutional Well developed. Well nourished.  Vascular Dorsalis pedis pulses palpable bilaterally. Posterior tibial pulses palpable bilaterally. Capillary refill normal to all digits.  No cyanosis or  clubbing noted. Pedal hair growth normal.  Neurologic Normal speech. Oriented to person, place, and time. Epicritic sensation to light touch grossly present bilaterally.  Dermatologic Nails well groomed and normal in appearance. No open wounds. No skin lesions.  Orthopedic:  Pain on palpation to the medial lateral gutter of the ankle joint.  Pain with range of motion of the ankle joint.  These are both consistent with active and passive.   Radiographs: 3 views of skeletally mature adult right ankle: Osteoarthritic changes moderate in nature noted.  No joint osteophytes noted.  There is uneven decrease in joint space. Assessment:   1. Painful orthopaedic hardware (HCC)   2. S/P foot surgery   3. Arthritis of ankle    Plan:  Patient was evaluated and treated and all questions answered.  Right ankle joint arthritis -I explained to the patient the etiology of arthritis and various treatment options were discussed.  I believe patient will benefit from a steroid injection given the severe Naitik nature of the arthritis that is present after undergoing arthroscopy.  If there is no improvement we will discuss MRI evaluation.  Patient is right now going through insurance changes and she will let me know when I can order the MRI of the right ankle. -A steroid injection was performed at right ankle joint using 1% plain Lidocaine and 10 mg of Kenalog. This was well tolerated.   No follow-ups on file.

## 2020-01-23 ENCOUNTER — Other Ambulatory Visit: Payer: Self-pay | Admitting: Podiatry

## 2020-01-24 ENCOUNTER — Other Ambulatory Visit: Payer: Self-pay | Admitting: Podiatry

## 2020-01-25 MED ORDER — CYCLOBENZAPRINE HCL 10 MG PO TABS
10.0000 mg | ORAL_TABLET | Freq: Three times a day (TID) | ORAL | 0 refills | Status: DC | PRN
Start: 2020-01-25 — End: 2020-06-12

## 2020-01-25 MED ORDER — OXYCODONE-ACETAMINOPHEN 5-325 MG PO TABS: 1.0000 | ORAL_TABLET | ORAL | 0 refills | Status: AC | PRN

## 2020-01-30 ENCOUNTER — Encounter: Payer: Self-pay | Admitting: Podiatry

## 2020-01-31 ENCOUNTER — Other Ambulatory Visit: Payer: Self-pay | Admitting: Podiatry

## 2020-02-01 ENCOUNTER — Telehealth: Payer: Self-pay | Admitting: Podiatry

## 2020-02-01 ENCOUNTER — Other Ambulatory Visit: Payer: Self-pay | Admitting: Podiatry

## 2020-02-01 DIAGNOSIS — S93401A Sprain of unspecified ligament of right ankle, initial encounter: Secondary | ICD-10-CM

## 2020-02-01 DIAGNOSIS — M19079 Primary osteoarthritis, unspecified ankle and foot: Secondary | ICD-10-CM

## 2020-02-01 NOTE — Telephone Encounter (Signed)
Patient requesting oxycodone refill, please advise

## 2020-02-02 ENCOUNTER — Other Ambulatory Visit: Payer: Self-pay | Admitting: Podiatry

## 2020-02-03 ENCOUNTER — Other Ambulatory Visit: Payer: Self-pay | Admitting: Podiatry

## 2020-02-04 MED ORDER — OXYCODONE-ACETAMINOPHEN 5-325 MG PO TABS
1.0000 | ORAL_TABLET | ORAL | 0 refills | Status: DC | PRN
Start: 2020-02-04 — End: 2020-03-01

## 2020-02-04 MED ORDER — OXYCODONE-ACETAMINOPHEN 5-325 MG PO TABS: 1.0000 | ORAL_TABLET | ORAL | 0 refills | Status: DC | PRN

## 2020-02-08 ENCOUNTER — Encounter: Payer: Medicaid Other | Admitting: Podiatry

## 2020-02-10 ENCOUNTER — Other Ambulatory Visit: Payer: Self-pay | Admitting: Podiatry

## 2020-02-14 ENCOUNTER — Telehealth: Payer: Self-pay

## 2020-02-14 NOTE — Telephone Encounter (Signed)
Madison Singleton called again requesting another refill of her pain medication.  Her next appt with you is next Thurs. 02/24/2020

## 2020-02-15 NOTE — Telephone Encounter (Signed)
Pt.notified

## 2020-02-15 NOTE — Telephone Encounter (Signed)
I will do it after the next scheduled appointment.

## 2020-02-22 ENCOUNTER — Encounter: Payer: Medicaid Other | Admitting: Podiatry

## 2020-02-22 ENCOUNTER — Telehealth: Payer: Self-pay | Admitting: *Deleted

## 2020-02-22 NOTE — Telephone Encounter (Signed)
"  I'm calling to see if I can get a refill on my pain medicine until I come in for my appointment on Thursday."  I will let him know.

## 2020-02-23 ENCOUNTER — Other Ambulatory Visit: Payer: Self-pay | Admitting: Podiatry

## 2020-02-24 ENCOUNTER — Ambulatory Visit (INDEPENDENT_AMBULATORY_CARE_PROVIDER_SITE_OTHER): Payer: Medicaid Other | Admitting: Podiatry

## 2020-02-24 ENCOUNTER — Other Ambulatory Visit: Payer: Self-pay

## 2020-02-24 ENCOUNTER — Encounter: Payer: Medicaid Other | Admitting: Podiatry

## 2020-02-24 DIAGNOSIS — S93401A Sprain of unspecified ligament of right ankle, initial encounter: Secondary | ICD-10-CM | POA: Diagnosis not present

## 2020-02-24 DIAGNOSIS — M19079 Primary osteoarthritis, unspecified ankle and foot: Secondary | ICD-10-CM | POA: Diagnosis not present

## 2020-02-24 MED ORDER — OXYCODONE-ACETAMINOPHEN 5-325 MG PO TABS
1.0000 | ORAL_TABLET | ORAL | 0 refills | Status: DC | PRN
Start: 2020-02-24 — End: 2020-06-12

## 2020-02-26 ENCOUNTER — Other Ambulatory Visit: Payer: Self-pay

## 2020-02-26 ENCOUNTER — Ambulatory Visit
Admission: RE | Admit: 2020-02-26 | Discharge: 2020-02-26 | Disposition: A | Discharge status: Home / Self Care | Payer: Medicaid Other | Source: Ambulatory Visit | Attending: Podiatry | Admitting: Podiatry

## 2020-02-26 ENCOUNTER — Encounter: Payer: Self-pay | Admitting: Podiatry

## 2020-02-26 DIAGNOSIS — S93401A Sprain of unspecified ligament of right ankle, initial encounter: Secondary | ICD-10-CM

## 2020-02-26 DIAGNOSIS — M19079 Primary osteoarthritis, unspecified ankle and foot: Secondary | ICD-10-CM

## 2020-02-26 NOTE — Progress Notes (Signed)
Subjective:  Patient ID: Madison Singleton, female    DOB: [birth year],  MRN: [id number]  No chief complaint on file.   29 y.o. female presents with the above complaint.  Patient presents with continuous right ankle pain.  Patient states is painful to touch.  She states that removing the hardware helped a little bit but after long day of working her pain comes back.  She denies any other acute complaints.  She still continues to have a lot of pain.  No as I discussed with her prior that her arthritis in the ankle pain is pretty severe in nature after we did a scope.  If there is no improvement we will need to get a further starting as well as discuss possible future surgical options.  She denies any other acute complaints.   Review of Systems: Negative except as noted in the HPI. Denies N/V/F/Ch.  Past Medical History:  Diagnosis Date  . Anxiety    NO MEDS  . Breast mass   . Breast tenderness in female    3;00 x3 weeks  . Depression    NO MEDS  . GERD (gastroesophageal reflux disease)    OCC-NO MEDS  . Headache    HAS RECENTLY STARTED HAVING HA'S MORE FREQUENTLY    Current Outpatient Medications:  .  cyclobenzaprine (FLEXERIL) 10 MG tablet, Take 1 tablet (10 mg total) by mouth 3 (three) times daily as needed. for muscle spams, Disp: 30 tablet, Rfl: 0 .  cyclobenzaprine (FLEXERIL) 5 MG tablet, Take 1 tablet (5 mg total) by mouth 3 (three) times daily as needed., Disp: 15 tablet, Rfl: 0 .  gabapentin (NEURONTIN) 400 MG capsule, Take 1 capsule (400 mg total) by mouth 3 (three) times daily., Disp: 30 capsule, Rfl: 1 .  ibuprofen (ADVIL) 800 MG tablet, Take 1 tablet (800 mg total) by mouth every 6 (six) hours as needed., Disp: 60 tablet, Rfl: 1 .  ibuprofen (ADVIL) 800 MG tablet, Take 1 tablet (800 mg total) by mouth every 6 (six) hours as needed., Disp: 60 tablet, Rfl: 1 .  ketorolac (TORADOL) 10 MG tablet, Take 1 tablet (10 mg total) by mouth every 6 (six) hours as needed., Disp: 20  tablet, Rfl: 0 .  oxyCODONE-acetaminophen (PERCOCET) 10-325 MG tablet, Take 1 tablet by mouth every 4 (four) hours as needed for pain., Disp: 30 tablet, Rfl: 0 .  oxyCODONE-acetaminophen (PERCOCET) 5-325 MG tablet, Take 1-2 tablets by mouth every 4 (four) hours as needed for severe pain., Disp: 28 tablet, Rfl: 0 .  oxyCODONE-acetaminophen (PERCOCET) 5-325 MG tablet, Take 1-2 tablets by mouth every 4 (four) hours as needed for severe pain., Disp: 15 tablet, Rfl: 0 .  oxyCODONE-acetaminophen (PERCOCET) 5-325 MG tablet, Take 1-2 tablets by mouth every 4 (four) hours as needed for severe pain., Disp: 30 tablet, Rfl: 0 .  oxyCODONE-acetaminophen (PERCOCET) 5-325 MG tablet, Take 1-2 tablets by mouth every 4 (four) hours as needed for severe pain., Disp: 30 tablet, Rfl: 0 .  QUEtiapine (SEROQUEL) 100 MG tablet, Take by mouth at bedtime., Disp: , Rfl:   Social History   Tobacco Use  Smoking Status Current Every Day Smoker  . Packs/day: 0.10  . Years: 14.00  . Pack years: 1.40  . Types: Cigarettes  Smokeless Tobacco Never Used    Allergies  Allergen Reactions  . Haldol [Haloperidol Lactate]     Throat swelling  . Amoxicillin Rash    Did it involve swelling of the face/tongue/throat, SOB, or low BP? Unknown Did  it involve sudden or severe rash/hives, skin peeling, or any reaction on the inside of your mouth or nose? Yes Did you need to seek medical attention at a hospital or doctor's office? Yes When did it last happen?Childhood reaction. If all above answers are "NO", may proceed with cephalosporin use.    Objective:  There were no vitals filed for this visit. There is no height or weight on file to calculate BMI. Constitutional Well developed. Well nourished.  Vascular Dorsalis pedis pulses palpable bilaterally. Posterior tibial pulses palpable bilaterally. Capillary refill normal to all digits.  No cyanosis or clubbing noted. Pedal hair growth normal.  Neurologic Normal  speech. Oriented to person, place, and time. Epicritic sensation to light touch grossly present bilaterally.  Dermatologic Nails well groomed and normal in appearance. No open wounds. No skin lesions.  Orthopedic:  Pain on palpation to the medial lateral gutter of the ankle joint.  Pain with range of motion of the ankle joint.  These are both consistent with active and passive.   Radiographs: 3 views of skeletally mature adult right ankle: Osteoarthritic changes moderate in nature noted.  No joint osteophytes noted.  There is uneven decrease in joint space. Assessment:   1. Arthritis of ankle   2. Sprain of right ankle, unspecified ligament, initial encounter    Plan:  Patient was evaluated and treated and all questions answered.  Right ankle joint arthritis -I explained to the patient the etiology of arthritis and various treatment options were discussed.  I believe patient will benefit from a steroid injection given the severe Naitik nature of the arthritis that is present after undergoing arthroscopy.  If there is no improvement we will discuss MRI evaluation.  Patient is right now going through insurance changes and she will let me know when I can order the MRI of the right ankle. -Awaiting MRI   No follow-ups on file.

## 2020-02-27 ENCOUNTER — Encounter: Payer: Self-pay | Admitting: Podiatry

## 2020-02-28 ENCOUNTER — Other Ambulatory Visit: Payer: Self-pay | Admitting: Podiatry

## 2020-02-29 ENCOUNTER — Telehealth: Payer: Self-pay | Admitting: *Deleted

## 2020-02-29 ENCOUNTER — Encounter: Payer: Self-pay | Admitting: Podiatry

## 2020-02-29 DIAGNOSIS — G894 Chronic pain syndrome: Secondary | ICD-10-CM

## 2020-02-29 NOTE — Telephone Encounter (Signed)
Have her follow back up with me sometime next week and I can go over the MRI with her.  Thank you

## 2020-02-29 NOTE — Telephone Encounter (Signed)
"  Has he responded to my message yet?"  He has not responded.  "Why, what is the problem?"  He has been seeing patients.  I'll let him know that you called again.  I'm calling you back.  He actually did respond.  He said he'll see you for your next appointment and give you your MRI results.  He said no refill on the pain medication.  "Why is that?"  He cannot give you pain medication on a long term basis.  He'll have to refer you to pain management.  "He told me he would refer me to pain management but until then he'd give me the pain medicine.  My next appointment isn't until 12/16.  Does he have anything sooner than that?"  His schedules are full until 03/09/2020.  "Why did he change his mind?"  I am not sure.  I'll send him a message and ask him.

## 2020-02-29 NOTE — Telephone Encounter (Signed)
"  Can you tell me if Dr. Allena Katz has seen my message?"  Dr. Allena Katz has not responded to your question yet.  "Okay, thank you."

## 2020-03-01 ENCOUNTER — Telehealth: Payer: Self-pay | Admitting: Podiatry

## 2020-03-01 MED ORDER — OXYCODONE-ACETAMINOPHEN 5-325 MG PO TABS
1.0000 | ORAL_TABLET | ORAL | 0 refills | Status: DC | PRN
Start: 2020-03-01 — End: 2020-06-12

## 2020-03-01 NOTE — Telephone Encounter (Signed)
"  He sent my prescription to the wrong pharmacy.  Can [facility] send it to [facility] on [address]?"   I'll send him a message.

## 2020-03-01 NOTE — Telephone Encounter (Signed)
Patient has requested meds be resent to [facility] pharmacy, they have been sent to the [facility] wrong pharmacy

## 2020-03-01 NOTE — Telephone Encounter (Signed)
Patient called in stating prescription was sent to wrong pharmacy, please resend to [facility] of [facility]. I have updated information in patients chart to reflect correct pharmacy, Please Advise

## 2020-03-01 NOTE — Telephone Encounter (Signed)
done

## 2020-03-01 NOTE — Addendum Note (Signed)
Addended by: Nicholes Rough on: 03/01/2020 01:20 PM   Modules accepted: Orders

## 2020-03-06 ENCOUNTER — Other Ambulatory Visit: Payer: Self-pay | Admitting: Podiatry

## 2020-03-09 ENCOUNTER — Encounter: Payer: Self-pay | Admitting: Podiatry

## 2020-03-09 ENCOUNTER — Other Ambulatory Visit: Payer: Self-pay

## 2020-03-09 ENCOUNTER — Ambulatory Visit (INDEPENDENT_AMBULATORY_CARE_PROVIDER_SITE_OTHER): Payer: Medicaid Other | Admitting: Podiatry

## 2020-03-09 DIAGNOSIS — S93401A Sprain of unspecified ligament of right ankle, initial encounter: Secondary | ICD-10-CM

## 2020-03-09 DIAGNOSIS — M19079 Primary osteoarthritis, unspecified ankle and foot: Secondary | ICD-10-CM

## 2020-03-09 DIAGNOSIS — G894 Chronic pain syndrome: Secondary | ICD-10-CM

## 2020-03-09 MED ORDER — OXYCODONE-ACETAMINOPHEN 5-325 MG PO TABS
1.0000 | ORAL_TABLET | ORAL | 0 refills | Status: DC | PRN
Start: 2020-03-09 — End: 2020-06-12

## 2020-03-09 MED ORDER — CYCLOBENZAPRINE HCL 10 MG PO TABS: 10.0000 mg | ORAL_TABLET | Freq: Three times a day (TID) | ORAL | 0 refills | Status: DC | PRN

## 2020-03-09 NOTE — Progress Notes (Signed)
Subjective:  Patient ID: Madison Singleton, female    DOB: February 09, 1991,  MRN: [id number]  Chief Complaint  Patient presents with  . Foot Pain    "the pain is getting worse and it really aches when it gets cold"   She is here to discuss MRI results    29 y.o. female presents with the above complaint.  Patient presents with continuous right ankle pain.  Patient states is painful to touch.  She states that removing the hardware helped a little bit but after long day of working her pain comes back.  She denies any other acute complaints.  She still continues to have a lot of pain.  No as I discussed with her prior that her arthritis in the ankle pain is pretty severe in nature after we did a scope.  If t she is here to undergo review her MRI results as well.   Review of Systems: Negative except as noted in the HPI. Denies N/V/F/Ch.  Past Medical History:  Diagnosis Date  . Anxiety    NO MEDS  . Breast mass   . Breast tenderness in female    3;00 x3 weeks  . Depression    NO MEDS  . GERD (gastroesophageal reflux disease)    OCC-NO MEDS  . Headache    HAS RECENTLY STARTED HAVING HA'S MORE FREQUENTLY    Current Outpatient Medications:  .  cyclobenzaprine (FLEXERIL) 10 MG tablet, Take 1 tablet (10 mg total) by mouth 3 (three) times daily as needed. for muscle spams, Disp: 30 tablet, Rfl: 0 .  cyclobenzaprine (FLEXERIL) 10 MG tablet, Take 1 tablet (10 mg total) by mouth 3 (three) times daily as needed for muscle spasms., Disp: 30 tablet, Rfl: 0 .  cyclobenzaprine (FLEXERIL) 5 MG tablet, Take 1 tablet (5 mg total) by mouth 3 (three) times daily as needed., Disp: 15 tablet, Rfl: 0 .  gabapentin (NEURONTIN) 400 MG capsule, Take 1 capsule (400 mg total) by mouth 3 (three) times daily., Disp: 30 capsule, Rfl: 1 .  ibuprofen (ADVIL) 800 MG tablet, Take 1 tablet (800 mg total) by mouth every 6 (six) hours as needed., Disp: 60 tablet, Rfl: 1 .  ibuprofen (ADVIL) 800 MG tablet, Take 1 tablet (800 mg  total) by mouth every 6 (six) hours as needed., Disp: 60 tablet, Rfl: 1 .  ketorolac (TORADOL) 10 MG tablet, Take 1 tablet (10 mg total) by mouth every 6 (six) hours as needed., Disp: 20 tablet, Rfl: 0 .  oxyCODONE-acetaminophen (PERCOCET) 10-325 MG tablet, Take 1 tablet by mouth every 4 (four) hours as needed for pain., Disp: 30 tablet, Rfl: 0 .  oxyCODONE-acetaminophen (PERCOCET) 5-325 MG tablet, Take 1-2 tablets by mouth every 4 (four) hours as needed for severe pain., Disp: 28 tablet, Rfl: 0 .  oxyCODONE-acetaminophen (PERCOCET) 5-325 MG tablet, Take 1-2 tablets by mouth every 4 (four) hours as needed for severe pain., Disp: 30 tablet, Rfl: 0 .  oxyCODONE-acetaminophen (PERCOCET) 5-325 MG tablet, Take 1-2 tablets by mouth every 4 (four) hours as needed for severe pain., Disp: 30 tablet, Rfl: 0 .  oxyCODONE-acetaminophen (PERCOCET) 5-325 MG tablet, Take 1-2 tablets by mouth every 4 (four) hours as needed for severe pain., Disp: 15 tablet, Rfl: 0 .  oxyCODONE-acetaminophen (PERCOCET) 5-325 MG tablet, Take 1-2 tablets by mouth every 4 (four) hours as needed for severe pain., Disp: 30 tablet, Rfl: 0 .  oxyCODONE-acetaminophen (PERCOCET) 5-325 MG tablet, Take 1-2 tablets by mouth every 4 (four) hours as needed for  severe pain., Disp: 30 tablet, Rfl: 0 .  QUEtiapine (SEROQUEL) 100 MG tablet, Take by mouth at bedtime., Disp: , Rfl:   Social History   Tobacco Use  Smoking Status Current Every Day Smoker  . Packs/day: 0.10  . Years: 14.00  . Pack years: 1.40  . Types: Cigarettes  Smokeless Tobacco Never Used    Allergies  Allergen Reactions  . Haldol [Haloperidol Lactate]     Throat swelling  . Amoxicillin Rash    Did it involve swelling of the face/tongue/throat, SOB, or low BP? Unknown Did it involve sudden or severe rash/hives, skin peeling, or any reaction on the inside of your mouth or nose? Yes Did you need to seek medical attention at a hospital or doctor's office? Yes When did it  last happen?Childhood reaction. If all above answers are "NO", may proceed with cephalosporin use.    Objective:  There were no vitals filed for this visit. There is no height or weight on file to calculate BMI. Constitutional Well developed. Well nourished.  Vascular Dorsalis pedis pulses palpable bilaterally. Posterior tibial pulses palpable bilaterally. Capillary refill normal to all digits.  No cyanosis or clubbing noted. Pedal hair growth normal.  Neurologic Normal speech. Oriented to person, place, and time. Epicritic sensation to light touch grossly present bilaterally.  Dermatologic Nails well groomed and normal in appearance. No open wounds. No skin lesions.  Orthopedic:  Pain on palpation to the medial lateral gutter of the ankle joint.  Pain with range of motion of the ankle joint.  These are both consistent with active and passive.   Radiographs: 3 views of skeletally mature adult right ankle: Osteoarthritic changes moderate in nature noted.  No joint osteophytes noted.  There is uneven decrease in joint space. Assessment:   1. Chronic pain syndrome   2. Arthritis of ankle   3. Sprain of right ankle, unspecified ligament, initial encounter    Plan:  Patient was evaluated and treated and all questions answered.  Right ankle joint arthritis -I explained to the patient the etiology of arthritis and various treatment options were discussed.  I believe patient will benefit from a steroid injection given the severe Naitik nature of the arthritis that is present after undergoing arthroscopy.  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  I discussed with the patient that these could be attributed to signs of arthritis. -At this time I discussed with the patient extensive detail surgical options are given that there is significant arthritis noted to the ankle joint and at this time  patient may need ankle arthroplasty with an ankle implant or ankle fusion however if she is able to manage the pain I would like to hold off for as long as I can.  Patient agrees with the plan. -Awaiting authorization from pain management for pain control   No follow-ups on file.

## 2020-03-13 ENCOUNTER — Other Ambulatory Visit: Payer: Self-pay | Admitting: Podiatry

## 2020-03-13 ENCOUNTER — Encounter: Payer: Self-pay | Admitting: Podiatry

## 2020-03-14 ENCOUNTER — Telehealth: Payer: Self-pay | Admitting: *Deleted

## 2020-03-14 NOTE — Telephone Encounter (Signed)
"  I need a refill on my pain medication."  Dr. Allena Katz is not in the office today.  "When will he be back?"  I think he will be back on Thursday.  "He doesn't check his messages when he's off, does he?"  He does not check his messages when he's out of the office.

## 2020-03-15 ENCOUNTER — Other Ambulatory Visit: Payer: Self-pay | Admitting: Podiatry

## 2020-03-15 MED ORDER — OXYCODONE-ACETAMINOPHEN 5-325 MG PO TABS
1.0000 | ORAL_TABLET | ORAL | 0 refills | Status: DC | PRN
Start: 2020-03-15 — End: 2020-06-12

## 2020-03-15 NOTE — Telephone Encounter (Signed)
Has this been completed?

## 2020-03-15 NOTE — Telephone Encounter (Signed)
done

## 2020-03-20 ENCOUNTER — Telehealth: Payer: Self-pay

## 2020-03-20 NOTE — Telephone Encounter (Signed)
Pt called in for a refill on pain medication. Pt is aware that the provider is in surgery today.

## 2020-03-21 ENCOUNTER — Telehealth: Payer: Self-pay | Admitting: *Deleted

## 2020-03-21 MED ORDER — OXYCODONE-ACETAMINOPHEN 5-325 MG PO TABS
1.0000 | ORAL_TABLET | ORAL | 0 refills | Status: DC | PRN
Start: 2020-03-21 — End: 2020-06-12

## 2020-03-21 NOTE — Telephone Encounter (Signed)
I sent her pain meds

## 2020-03-21 NOTE — Telephone Encounter (Signed)
"  Has Dr. Allena Katz responded to my message?"  He has not responded at this time.  "What's taking so long for him to respond?  My foot is killing me?  It seems like it is getting worse.  Can you send the message to him again?"  I will send the message to Dr. Allena Katz again.

## 2020-03-26 ENCOUNTER — Other Ambulatory Visit: Payer: Self-pay | Admitting: Podiatry

## 2020-03-29 ENCOUNTER — Telehealth: Payer: Self-pay | Admitting: *Deleted

## 2020-03-29 NOTE — Telephone Encounter (Signed)
A woman called stating she was Anni's mother.  She stated Braniya needs a refill on her pain medication.  My computer was frozen so I put her on hold.  She hung up before I could get back to her.  Whomever she was, her name is not on [employer] form."

## 2020-03-29 NOTE — Telephone Encounter (Signed)
Pts grandmother, Madison Singleton called and stated that the pt needed a refill on her pain medication and she would like it to be sent to [facility]. No information was released as she is not on the DPR.

## 2020-04-06 ENCOUNTER — Telehealth: Payer: Self-pay | Admitting: Podiatry

## 2020-04-06 MED ORDER — OXYCODONE-ACETAMINOPHEN 5-325 MG PO TABS
1.0000 | ORAL_TABLET | ORAL | 0 refills | Status: DC | PRN
Start: 2020-04-06 — End: 2020-04-17

## 2020-04-06 NOTE — Telephone Encounter (Signed)
Pt called in requesting pain meds to be sent to [facility].

## 2020-04-06 NOTE — Telephone Encounter (Signed)
"  I need a refill on my pain medication.  I called and spoke to Christan earlier.  Has he called it in?"  He has not sent a prescription in at this time.  "What is going on?  How can I get the medicine?"  Dr. Allena Katz is the only one that can prescribe narcotics.  I can send him another message.  "Okay, do that."

## 2020-04-06 NOTE — Addendum Note (Signed)
Addended by: Nicholes Rough on: 04/06/2020 02:30 PM   Modules accepted: Orders

## 2020-04-14 ENCOUNTER — Telehealth: Payer: Self-pay

## 2020-04-14 NOTE — Telephone Encounter (Signed)
Pt would like refill on pain meds sent to [facility] [address].

## 2020-04-17 ENCOUNTER — Telehealth: Payer: Self-pay | Admitting: Podiatry

## 2020-04-17 ENCOUNTER — Other Ambulatory Visit: Payer: Self-pay | Admitting: Podiatry

## 2020-04-17 NOTE — Telephone Encounter (Signed)
Please advise 

## 2020-04-17 NOTE — Telephone Encounter (Signed)
Pt called and stated that she is taking 2 pain pills every 4-6 hours. She had 4 teeth removed this morning but the dentist did not prescribe any meds because she was already receiving meds from [facility]. Pt would like meds sent in as soon as possible as she is in a lot of pain from the procedure this morning and her ankle.

## 2020-04-17 NOTE — Telephone Encounter (Signed)
Dr. Rogelia Rohrer has requested return call regarding patient and pain medications, Please Advise     Dr. Rogelia Rohrer contact   [phone]

## 2020-04-18 MED ORDER — OXYCODONE-ACETAMINOPHEN 5-325 MG PO TABS: 1.0000 | ORAL_TABLET | ORAL | 0 refills | Status: DC | PRN

## 2020-04-18 NOTE — Telephone Encounter (Signed)
Pt notified that meds were sent in.

## 2020-04-19 ENCOUNTER — Encounter: Payer: Self-pay | Admitting: Podiatry

## 2020-04-20 ENCOUNTER — Encounter: Payer: Self-pay | Admitting: Podiatry

## 2020-04-20 ENCOUNTER — Other Ambulatory Visit: Payer: Self-pay | Admitting: Podiatry

## 2020-04-20 ENCOUNTER — Other Ambulatory Visit: Payer: Self-pay

## 2020-04-20 ENCOUNTER — Telehealth: Payer: Self-pay | Admitting: *Deleted

## 2020-04-20 DIAGNOSIS — G894 Chronic pain syndrome: Secondary | ICD-10-CM

## 2020-04-20 MED ORDER — OXYCODONE-ACETAMINOPHEN 10-325 MG PO TABS: 1.0000 | ORAL_TABLET | Freq: Four times a day (QID) | ORAL | 0 refills | Status: AC | PRN

## 2020-04-20 NOTE — Progress Notes (Signed)
Referral has been faxed to [facility]

## 2020-04-20 NOTE — Telephone Encounter (Signed)
"  Can you let Dr. Allena Katz know that the refill approval for my pain medicine needs authorization before I can get it?  The pharmacy said they sent over a paper about it."  I'll let him know.  We haven't received a form from the pharmacy.  "They said they sent it.  I'm going to call them back now.  Just let him know I need the authorization."  I'll let him know.

## 2020-04-20 NOTE — Telephone Encounter (Signed)
Prior authorization request has been sent to [facility] through cover my meds

## 2020-04-20 NOTE — Addendum Note (Signed)
Addended by: Nicholes Rough on: 04/20/2020 09:55 AM   Modules accepted: Orders

## 2020-04-21 ENCOUNTER — Encounter: Payer: Self-pay | Admitting: Podiatry

## 2020-04-24 ENCOUNTER — Telehealth: Payer: Self-pay | Admitting: *Deleted

## 2020-04-24 ENCOUNTER — Telehealth: Payer: Self-pay | Admitting: Podiatry

## 2020-04-24 MED ORDER — OXYCODONE-ACETAMINOPHEN 5-325 MG PO TABS: 1.0000 | ORAL_TABLET | ORAL | 0 refills | Status: DC | PRN

## 2020-04-24 NOTE — Telephone Encounter (Signed)
"  I need a refill on my pain medication.  I only bought half of the medicine, 20 tablets, last week because my insurance wouldn't pay for it.  I need another prescription.  Can you send Dr. Allena Katz a message?" I'll let Dr. Allena Katz know.

## 2020-04-24 NOTE — Telephone Encounter (Signed)
Patient has requested refill, patient stated the last prescription she had to pay out of pocket due to the way the script was sent, Please Advise

## 2020-04-24 NOTE — Addendum Note (Signed)
Addended by: Nicholes Rough on: 04/24/2020 07:55 PM   Modules accepted: Orders

## 2020-04-27 ENCOUNTER — Encounter: Payer: Self-pay | Admitting: Podiatry

## 2020-05-01 ENCOUNTER — Telehealth: Payer: Self-pay

## 2020-05-01 NOTE — Telephone Encounter (Signed)
Pt would like a refill for pain meds sent [facility] [address]. She stated she is completely out.

## 2020-05-02 ENCOUNTER — Telehealth: Payer: Self-pay | Admitting: Podiatry

## 2020-05-02 MED ORDER — OXYCODONE-ACETAMINOPHEN 5-325 MG PO TABS: 1.0000 | ORAL_TABLET | ORAL | 0 refills | Status: DC | PRN

## 2020-05-02 NOTE — Telephone Encounter (Signed)
Patient has requested refill on pain meds, please advise 

## 2020-05-08 ENCOUNTER — Telehealth: Payer: Self-pay

## 2020-05-08 NOTE — Telephone Encounter (Signed)
Pt would like a refill for pain meds sent to [facility]. Pt is aware that the pt is in surgery today.

## 2020-05-09 ENCOUNTER — Other Ambulatory Visit: Payer: Self-pay

## 2020-05-09 ENCOUNTER — Emergency Department: Payer: Medicaid Other

## 2020-05-09 ENCOUNTER — Emergency Department
Admission: EM | Admit: 2020-05-09 | Discharge: 2020-05-09 | Disposition: A | Discharge status: Home / Self Care | Payer: Medicaid Other | Attending: Emergency Medicine | Admitting: Emergency Medicine

## 2020-05-09 DIAGNOSIS — G43409 Hemiplegic migraine, not intractable, without status migrainosus: Secondary | ICD-10-CM

## 2020-05-09 DIAGNOSIS — G43109 Migraine with aura, not intractable, without status migrainosus: Secondary | ICD-10-CM

## 2020-05-09 DIAGNOSIS — G43809 Other migraine, not intractable, without status migrainosus: Secondary | ICD-10-CM | POA: Insufficient documentation

## 2020-05-09 DIAGNOSIS — R519 Headache, unspecified: Secondary | ICD-10-CM | POA: Diagnosis present

## 2020-05-09 DIAGNOSIS — F17210 Nicotine dependence, cigarettes, uncomplicated: Secondary | ICD-10-CM | POA: Diagnosis not present

## 2020-05-09 LAB — COMPREHENSIVE METABOLIC PANEL
ALT: 25 U/L (ref 0–44)
AST: 25 U/L (ref 15–41)
Albumin: 3.9 g/dL (ref 3.5–5.0)
Alkaline Phosphatase: 63 U/L (ref 38–126)
Anion gap: 8 (ref 5–15)
BUN: 17 mg/dL (ref 6–20)
CO2: 25 mmol/L (ref 22–32)
Calcium: 9.2 mg/dL (ref 8.9–10.3)
Chloride: 104 mmol/L (ref 98–111)
Creatinine, Ser: 1.08 mg/dL — ABNORMAL HIGH (ref 0.44–1.00)
GFR, Estimated: >60 mL/min (ref >60)
Glucose, Bld: 107 mg/dL — ABNORMAL HIGH (ref 70–99)
Potassium: 4.2 mmol/L (ref 3.5–5.1)
Sodium: 137 mmol/L (ref 135–145)
Total Bilirubin: 0.6 mg/dL (ref 0.3–1.2)
Total Protein: 7.2 g/dL (ref 6.5–8.1)

## 2020-05-09 LAB — CBC
HCT: 32.9 % — ABNORMAL LOW (ref 36.0–46.0)
Hemoglobin: 10.6 g/dL — ABNORMAL LOW (ref 12.0–15.0)
MCH: 24.7 pg — ABNORMAL LOW (ref 26.0–34.0)
MCHC: 32.2 g/dL (ref 30.0–36.0)
MCV: 76.7 fL — ABNORMAL LOW (ref 80.0–100.0)
Platelets: 446 10*3/uL — ABNORMAL HIGH (ref 150–400)
RBC: 4.29 MIL/uL (ref 3.87–5.11)
RDW: 17.9 % — ABNORMAL HIGH (ref 11.5–15.5)
WBC: 7.5 10*3/uL (ref 4.0–10.5)
nRBC: 0 % (ref 0.0–0.2)

## 2020-05-09 LAB — DIFFERENTIAL
Abs Immature Granulocytes: 0.02 10*3/uL (ref 0.00–0.07)
Basophils Absolute: 0 10*3/uL (ref 0.0–0.1)
Basophils Relative: 0 %
Eosinophils Absolute: 0 10*3/uL (ref 0.0–0.5)
Eosinophils Relative: 0 %
Immature Granulocytes: 0 %
Lymphocytes Relative: 33 %
Lymphs Abs: 2.5 10*3/uL (ref 0.7–4.0)
Monocytes Absolute: 0.9 10*3/uL (ref 0.1–1.0)
Monocytes Relative: 13 %
Neutro Abs: 4 10*3/uL (ref 1.7–7.7)
Neutrophils Relative %: 54 %

## 2020-05-09 LAB — PROTIME-INR
INR: 1 (ref 0.8–1.2)
Prothrombin Time: 12.9 seconds (ref 11.4–15.2)

## 2020-05-09 LAB — APTT: aPTT: 28 seconds (ref 24–36)

## 2020-05-09 LAB — CBG MONITORING, ED: Glucose-Capillary: 111 mg/dL — ABNORMAL HIGH (ref 70–99)

## 2020-05-09 LAB — TROPONIN I (HIGH SENSITIVITY): Troponin I (High Sensitivity): 4 ng/L (ref <18)

## 2020-05-09 MED ORDER — PROCHLORPERAZINE EDISYLATE 10 MG/2ML IJ SOLN
10.0000 mg | Freq: Once | INTRAMUSCULAR | Status: AC
  Administered 2020-05-09: 10 mg via INTRAVENOUS
  Filled 2020-05-09: qty 2

## 2020-05-09 MED ORDER — OXYCODONE-ACETAMINOPHEN 5-325 MG PO TABS: 1.0000 | ORAL_TABLET | ORAL | 0 refills | Status: DC | PRN

## 2020-05-09 MED ORDER — DIPHENHYDRAMINE HCL 50 MG/ML IJ SOLN
25.0000 mg | Freq: Once | INTRAMUSCULAR | Status: AC
  Administered 2020-05-09: 25 mg via INTRAVENOUS
  Filled 2020-05-09: qty 1

## 2020-05-09 MED ORDER — LORAZEPAM 2 MG/ML IJ SOLN
1.0000 mg | INTRAMUSCULAR | Status: DC | PRN
  Administered 2020-05-09: 1 mg via INTRAVENOUS
  Filled 2020-05-09: qty 1

## 2020-05-09 MED ORDER — SODIUM CHLORIDE 0.9% FLUSH
3.0000 mL | Freq: Once | INTRAVENOUS | Status: DC
Start: 2020-05-09 — End: 2020-05-10

## 2020-05-09 NOTE — ED Notes (Signed)
meds given for slight headache.  Er md at bedside.  Pt drinking apple juice.  Pt alert  Speech clear.  Pt watching tv and on cell phone

## 2020-05-09 NOTE — ED Notes (Signed)
Pt report a headache that began at 1300 today. Pt had numbness on left side of face.  Pt called her doctor and came to er for eval.  Pt reports having trouble getting words out and vision change in left eye.  On arrival to er, no numbness or trouble speaking  Pt ambulates without diff.  Neurologist in with pt after ct scan to perform exam on pt.  nsr on monitor.  Pt talking on cell phone.

## 2020-05-09 NOTE — ED Notes (Signed)
Iv dc'ed  Pt signed  Esignature.

## 2020-05-09 NOTE — ED Provider Notes (Signed)
[facility] Emergency Department Provider Note   ____________________________________________   Event Date/Time   First MD Initiated Contact with Patient 05/09/20 1623     (approximate)  I have reviewed the triage vital signs and the nursing notes.   HISTORY  Chief Complaint Headache and Aphasia    HPI Madison Singleton is a 30 y.o. female with past medical history of generalized anxiety disorder, headaches, and polysubstance abuse who presents to the ED complaining of numbness.  Patient reports around 1 PM this afternoon she developed diffuse throbbing headache with numbness and tingling affecting the left side of her face.  She additionally feels like it has been hard for her to get words out at times and she has had occasional blurry vision in her left eye.  Symptoms have been waxing and waning in severity since onset, she states headache initially improved but is now coming back.  The tingling to her face has improved along with her vision, but she continues to state that she is having difficulty getting words out at times.  She has had issues with headaches before, but denies any history of migraines.  She denies any fevers or neck stiffness.  She denies any numbness or weakness in her extremities.        Past Medical History:  Diagnosis Date  . Anxiety    NO MEDS  . Breast mass   . Breast tenderness in female    3;00 x3 weeks  . Depression    NO MEDS  . GERD (gastroesophageal reflux disease)    OCC-NO MEDS  . Headache    HAS RECENTLY STARTED HAVING HA'S MORE FREQUENTLY    Patient Active Problem List   Diagnosis Date Noted  . Supervision of high risk pregnancy in third trimester 03/25/2018  . Pelvic pressure in pregnancy, antepartum, third trimester 02/26/2018  . Generalized anxiety disorder 07/11/2016  . Cocaine abuse (HCC) 07/11/2016    Past Surgical History:  Procedure Laterality Date  . KNEE ARTHROSCOPY WITH MEDIAL MENISECTOMY Right  09/07/2015   Procedure: Arthroscopic Lateral Release ;  Surgeon: Kennedy BuckerMichael Menz, MD;  Location: [facility] ORS;  Service: Orthopedics;  Laterality: Right;  . MOUTH SURGERY  12/2018  . ORIF ANKLE FRACTURE Right 02/16/2019   Procedure: OPEN REDUCTION INTERNAL FIXATION (ORIF) ANKLE FRACTURE;  Surgeon: Lyndle HerrlichBowers, James R, MD;  Location: [facility] ORS;  Service: Orthopedics;  Laterality: Right;    Prior to Admission medications   Medication Sig Start Date End Date Taking? Authorizing Provider  cyclobenzaprine (FLEXERIL) 10 MG tablet Take 1 tablet (10 mg total) by mouth 3 (three) times daily as needed. for muscle spams 01/25/20   Candelaria StagersPatel, Kevin P, DPM  cyclobenzaprine (FLEXERIL) 10 MG tablet Take 1 tablet (10 mg total) by mouth 3 (three) times daily as needed for muscle spasms. 03/09/20   Candelaria StagersPatel, Kevin P, DPM  cyclobenzaprine (FLEXERIL) 5 MG tablet Take 1 tablet (5 mg total) by mouth 3 (three) times daily as needed. 02/05/19   Menshew, Charlesetta IvoryJenise V Bacon, PA-C  gabapentin (NEURONTIN) 400 MG capsule Take 1 capsule (400 mg total) by mouth 3 (three) times daily. 11/01/19   Candelaria StagersPatel, Kevin P, DPM  ibuprofen (ADVIL) 800 MG tablet Take 1 tablet (800 mg total) by mouth every 6 (six) hours as needed. 10/18/19   McDonald, Rachelle HoraAdam R, DPM  ibuprofen (ADVIL) 800 MG tablet Take 1 tablet (800 mg total) by mouth every 6 (six) hours as needed. 10/18/19   Candelaria StagersPatel, Kevin P, DPM  ketorolac (TORADOL) 10  MG tablet Take 1 tablet (10 mg total) by mouth every 6 (six) hours as needed. 11/01/19   Candelaria Stagers, DPM  oxyCODONE-acetaminophen (PERCOCET) 10-325 MG tablet Take 1 tablet by mouth every 4 (four) hours as needed for pain. 12/16/19   Candelaria Stagers, DPM  oxyCODONE-acetaminophen (PERCOCET) 5-325 MG tablet Take 1-2 tablets by mouth every 4 (four) hours as needed for severe pain. 12/22/19   Candelaria Stagers, DPM  oxyCODONE-acetaminophen (PERCOCET) 5-325 MG tablet Take 1-2 tablets by mouth every 4 (four) hours as needed for severe pain. 02/04/20   Candelaria Stagers, DPM   oxyCODONE-acetaminophen (PERCOCET) 5-325 MG tablet Take 1-2 tablets by mouth every 4 (four) hours as needed for severe pain. 02/24/20   Candelaria Stagers, DPM  oxyCODONE-acetaminophen (PERCOCET) 5-325 MG tablet Take 1-2 tablets by mouth every 4 (four) hours as needed for severe pain. 03/01/20   Candelaria Stagers, DPM  oxyCODONE-acetaminophen (PERCOCET) 5-325 MG tablet Take 1-2 tablets by mouth every 4 (four) hours as needed for severe pain. 03/01/20   Candelaria Stagers, DPM  oxyCODONE-acetaminophen (PERCOCET) 5-325 MG tablet Take 1-2 tablets by mouth every 4 (four) hours as needed for severe pain. 03/09/20   Candelaria Stagers, DPM  oxyCODONE-acetaminophen (PERCOCET) 5-325 MG tablet Take 1-2 tablets by mouth every 4 (four) hours as needed for severe pain. 03/15/20   Candelaria Stagers, DPM  oxyCODONE-acetaminophen (PERCOCET) 5-325 MG tablet Take 1-2 tablets by mouth every 4 (four) hours as needed for severe pain. 03/21/20   Candelaria Stagers, DPM  oxyCODONE-acetaminophen (PERCOCET) 5-325 MG tablet Take 1-2 tablets by mouth every 4 (four) hours as needed for severe pain. 04/18/20   Candelaria Stagers, DPM  oxyCODONE-acetaminophen (PERCOCET) 5-325 MG tablet Take 1-2 tablets by mouth every 4 (four) hours as needed for severe pain. 04/24/20   Candelaria Stagers, DPM  oxyCODONE-acetaminophen (PERCOCET) 5-325 MG tablet Take 1-2 tablets by mouth every 4 (four) hours as needed for severe pain. 05/02/20   Candelaria Stagers, DPM  oxyCODONE-acetaminophen (PERCOCET) 5-325 MG tablet Take 1-2 tablets by mouth every 4 (four) hours as needed for severe pain. 05/09/20   Candelaria Stagers, DPM  QUEtiapine (SEROQUEL) 100 MG tablet Take by mouth at bedtime.    [provider]    Allergies Haldol [haloperidol lactate] and Amoxicillin  Family History  Problem Relation Age of Onset  . Breast cancer Cousin     Social History Social History   Tobacco Use  . Smoking status: Current Every Day Smoker    Packs/day: 0.10    Years: 14.00    Pack  years: 1.40    Types: Cigarettes  . Smokeless tobacco: Never Used  Vaping Use  . Vaping Use: Never used  Substance Use Topics  . Alcohol use: Yes    Alcohol/week: 1.0 standard drink    Types: 1 Cans of beer per week    Comment: SOCIAL  . Drug use: Not Currently    Types: Cocaine    Review of Systems  Constitutional: No fever/chills Eyes: Positive for visual changes. ENT: No sore throat. Cardiovascular: Denies chest pain. Respiratory: Denies shortness of breath. Gastrointestinal: No abdominal pain.  No nausea, no vomiting.  No diarrhea.  No constipation. Genitourinary: Negative for dysuria. Musculoskeletal: Negative for back pain. Skin: Negative for rash. Neurological: Positive for headache, facial numbness and tingling, and speech difficulties.  Negative for extremity numbness or weakness.  ____________________________________________   PHYSICAL EXAM:  VITAL SIGNS: ED Triage Vitals [05/09/20  1613]  Enc Vitals Group     BP 115/77     Pulse Rate 82     Resp 18     Temp      Temp src      SpO2 98 %     Weight      Height      Head Circumference      Peak Flow      Pain Score      Pain Loc      Pain Edu?      Excl. in GC?     Constitutional: Alert and oriented. Eyes: Conjunctivae are normal.  Pupils equal round and reactive to light bilaterally, extraocular movements intact. Head: Atraumatic. Nose: No congestion/rhinnorhea. Mouth/Throat: Mucous membranes are moist. Neck: Normal ROM Cardiovascular: Normal rate, regular rhythm. Grossly normal heart sounds. Respiratory: Normal respiratory effort.  No retractions. Lungs CTAB. Gastrointestinal: Soft and nontender. No distention. Genitourinary: deferred Musculoskeletal: No lower extremity tenderness nor edema. Neurologic:  Normal speech and language. No gross focal neurologic deficits are appreciated.  Cranial nerves II through XII grossly intact with no facial droop noted.  Sensation intact over face  bilaterally. Skin:  Skin is warm, dry and intact. No rash noted. Psychiatric: Mood and affect are normal. Speech and behavior are normal.  ____________________________________________   LABS (all labs ordered are listed, but only abnormal results are displayed)  Labs Reviewed  CBC - Abnormal; Notable for the following components:      Result Value   Hemoglobin 10.6 (*)    HCT 32.9 (*)    MCV 76.7 (*)    MCH 24.7 (*)    RDW 17.9 (*)    Platelets 446 (*)    All other components within normal limits  COMPREHENSIVE METABOLIC PANEL - Abnormal; Notable for the following components:   Glucose, Bld 107 (*)    Creatinine, Ser 1.08 (*)    All other components within normal limits  CBG MONITORING, ED - Abnormal; Notable for the following components:   Glucose-Capillary 111 (*)    All other components within normal limits  PROTIME-INR  APTT  DIFFERENTIAL  POC URINE PREG, ED  TROPONIN I (HIGH SENSITIVITY)   ____________________________________________  EKG  ED ECG REPORT I, Chesley Noon, the attending physician, personally viewed and interpreted this ECG.   Date: 05/09/2020  EKG Time: 16:12  Rate: 86  Rhythm: normal sinus rhythm  Axis: Normal  Intervals:none  ST&T Change: None   PROCEDURES  Procedure(s) performed (including Critical Care):  Procedures   ____________________________________________   INITIAL IMPRESSION / ASSESSMENT AND PLAN / ED COURSE       30 year old female with past medical history of headaches, generalized anxiety disorder, and cocaine abuse who presents to the ED with acute onset of headache along with left-sided facial numbness and tingling and word finding difficulties around 1 PM this afternoon.  Patient has had waxing and waning of symptoms since then and code stroke was called upon arrival.  CT head reviewed by me and shows no obvious hemorrhage, negative for acute process per radiology.  Pregnancy testing is negative.  Patient evaluated  by neurology and there is low suspicion for stroke at this time, symptoms seem more consistent with complex migraine.  Labs are pending and we will further assess with MRI, treat with migraine cocktail.  MRI brain is negative for acute process.  Patient has been sleeping comfortably and now states headache as well as neurologic symptoms are resolved.  I suspect her  symptoms are related to complicated migraine and she is appropriate for discharge home with PCP follow-up.  Patient agrees with plan.      ____________________________________________   FINAL CLINICAL IMPRESSION(S) / ED DIAGNOSES  Final diagnoses:  Complicated migraine     ED Discharge Orders    None       Note:  This document was prepared using Dragon voice recognition software and may include unintentional dictation errors.   Chesley Noon, MD 05/09/20 2144

## 2020-05-09 NOTE — Consult Note (Signed)
CODE STROKE- PHARMACY COMMUNICATION   Time CODE STROKE called/page received:1626  Time response to CODE STROKE was made (in person or via phone): 1626  Time Stroke Kit retrieved from [facility] (only if needed):n/a  Name of Provider/Nurse contacted: Dr Lorrin Goodell  Past Medical History:  Diagnosis Date  . Anxiety    NO MEDS  . Breast mass   . Breast tenderness in female    3;00 x3 weeks  . Depression    NO MEDS  . GERD (gastroesophageal reflux disease)    OCC-NO MEDS  . Headache    HAS RECENTLY STARTED HAVING HA'S MORE FREQUENTLY   Prior to Admission medications   Medication Sig Start Date End Date Taking? Authorizing Provider  cyclobenzaprine (FLEXERIL) 10 MG tablet Take 1 tablet (10 mg total) by mouth 3 (three) times daily as needed. for muscle spams 01/25/20   Felipa Furnace, DPM  cyclobenzaprine (FLEXERIL) 10 MG tablet Take 1 tablet (10 mg total) by mouth 3 (three) times daily as needed for muscle spasms. 03/09/20   Felipa Furnace, DPM  cyclobenzaprine (FLEXERIL) 5 MG tablet Take 1 tablet (5 mg total) by mouth 3 (three) times daily as needed. 02/05/19   Menshew, Dannielle Karvonen, PA-C  gabapentin (NEURONTIN) 400 MG capsule Take 1 capsule (400 mg total) by mouth 3 (three) times daily. 11/01/19   Felipa Furnace, DPM  ibuprofen (ADVIL) 800 MG tablet Take 1 tablet (800 mg total) by mouth every 6 (six) hours as needed. 10/18/19   McDonald, Stephan Minister, DPM  ibuprofen (ADVIL) 800 MG tablet Take 1 tablet (800 mg total) by mouth every 6 (six) hours as needed. 10/18/19   Felipa Furnace, DPM  ketorolac (TORADOL) 10 MG tablet Take 1 tablet (10 mg total) by mouth every 6 (six) hours as needed. 11/01/19   Felipa Furnace, DPM  oxyCODONE-acetaminophen (PERCOCET) 10-325 MG tablet Take 1 tablet by mouth every 4 (four) hours as needed for pain. 12/16/19   Felipa Furnace, DPM  oxyCODONE-acetaminophen (PERCOCET) 5-325 MG tablet Take 1-2 tablets by mouth every 4 (four) hours as needed for severe pain. 12/22/19   Felipa Furnace, DPM  oxyCODONE-acetaminophen (PERCOCET) 5-325 MG tablet Take 1-2 tablets by mouth every 4 (four) hours as needed for severe pain. 02/04/20   Felipa Furnace, DPM  oxyCODONE-acetaminophen (PERCOCET) 5-325 MG tablet Take 1-2 tablets by mouth every 4 (four) hours as needed for severe pain. 02/24/20   Felipa Furnace, DPM  oxyCODONE-acetaminophen (PERCOCET) 5-325 MG tablet Take 1-2 tablets by mouth every 4 (four) hours as needed for severe pain. 03/01/20   Felipa Furnace, DPM  oxyCODONE-acetaminophen (PERCOCET) 5-325 MG tablet Take 1-2 tablets by mouth every 4 (four) hours as needed for severe pain. 03/01/20   Felipa Furnace, DPM  oxyCODONE-acetaminophen (PERCOCET) 5-325 MG tablet Take 1-2 tablets by mouth every 4 (four) hours as needed for severe pain. 03/09/20   Felipa Furnace, DPM  oxyCODONE-acetaminophen (PERCOCET) 5-325 MG tablet Take 1-2 tablets by mouth every 4 (four) hours as needed for severe pain. 03/15/20   Felipa Furnace, DPM  oxyCODONE-acetaminophen (PERCOCET) 5-325 MG tablet Take 1-2 tablets by mouth every 4 (four) hours as needed for severe pain. 03/21/20   Felipa Furnace, DPM  oxyCODONE-acetaminophen (PERCOCET) 5-325 MG tablet Take 1-2 tablets by mouth every 4 (four) hours as needed for severe pain. 04/18/20   Felipa Furnace, DPM  oxyCODONE-acetaminophen (PERCOCET) 5-325 MG tablet Take 1-2 tablets by mouth every 4 (  four) hours as needed for severe pain. 04/24/20   Felipa Furnace, DPM  oxyCODONE-acetaminophen (PERCOCET) 5-325 MG tablet Take 1-2 tablets by mouth every 4 (four) hours as needed for severe pain. 05/02/20   Felipa Furnace, DPM  oxyCODONE-acetaminophen (PERCOCET) 5-325 MG tablet Take 1-2 tablets by mouth every 4 (four) hours as needed for severe pain. 05/09/20   Felipa Furnace, DPM  QUEtiapine (SEROQUEL) 100 MG tablet Take by mouth at bedtime.    [provider]    Lu Duffel ,PharmD Clinical Pharmacist  05/09/2020  4:29 PM

## 2020-05-09 NOTE — ED Triage Notes (Signed)
Pt comes pov from PCP after having a headache, slurred speech, numbness of left side of face. Pt speech is fine at this time and headache has resolved. Pt thinks she noticed her symptoms at 1 or 2pm. Negative for other stroke symptoms. States trouble "catching her thoughts".

## 2020-05-09 NOTE — ED Notes (Signed)
[name] in with pt.  Pt still on cell phone.  Speech clear.  Pt alert.

## 2020-05-09 NOTE — ED Notes (Signed)
meds given for mri

## 2020-05-09 NOTE — ED Notes (Signed)
Spoke to Dr. Lenard Lance, calling code stroke.

## 2020-05-09 NOTE — ED Notes (Signed)
Pt eating crackers and apple juice.  Pt waiting on mri

## 2020-05-09 NOTE — Consult Note (Signed)
NEUROLOGY CONSULTATION NOTE   Date of service: May 09, 2020 Patient Name: Betina Puckett MRN:  [id number] DOB:  1990/08/27 Reason for consult: "Stroke code" _ _ _   _ __   _ __ _ _  __ __   _ __   __ _  History of Present Illness  Lenyx Boody is a 30 y.o. female with PMH significant for headaches, depression, polysubstance use, GERD who presents with acute onset L facial tingling and stuttering speech with some word finding difficulty and holocephalic throbbing headache. Her symptoms started at 1300 on 05/09/20. She presented to the ED but her symptoms had significantly improved here. She denies any history of migraines, reports that she does gets headaches from time to time thou. Endorses that she has not been sleeping well over the last few days and endorses stress.  MRS: 0 NIHSS: 0 tPA and thrombectomy: Not offered due to resolution of her symptoms.   ROS   Constitutional Denies weight loss, fever and chills.  HEENT Had some left blurred vision but resolved. No problems with hearing.  Respiratory Denies SOB and cough.  CV Denies palpitations and CP  GI Denies abdominal pain, nausea, vomiting and diarrhea.  GU Denies dysuria and urinary frequency.  MSK Denies myalgia and joint pain.  Skin Denies rash and pruritus.  Neurological Endorses headache but no syncope.  Psychiatric Denies recent changes in mood. Denies anxiety and depression.   Past History   Past Medical History:  Diagnosis Date  . Anxiety    NO MEDS  . Breast mass   . Breast tenderness in female    3;00 x3 weeks  . Depression    NO MEDS  . GERD (gastroesophageal reflux disease)    OCC-NO MEDS  . Headache    HAS RECENTLY STARTED HAVING HA'S MORE FREQUENTLY   Past Surgical History:  Procedure Laterality Date  . KNEE ARTHROSCOPY WITH MEDIAL MENISECTOMY Right 09/07/2015   Procedure: Arthroscopic Lateral Release ;  Surgeon: Kennedy Bucker, MD;  Location: [facility] ORS;  Service: Orthopedics;  Laterality: Right;   . MOUTH SURGERY  12/2018  . ORIF ANKLE FRACTURE Right 02/16/2019   Procedure: OPEN REDUCTION INTERNAL FIXATION (ORIF) ANKLE FRACTURE;  Surgeon: Lyndle Herrlich, MD;  Location: [facility] OR[facility];  Service: Orthopedics;  Laterality: Right;   Family History  Problem Relation Age of Onset  . Breast cancer Cousin    Social History   Socioeconomic History  . Marital status: Married    Spouse name: Not on file  . Number of children: Not on file  . Years of education: Not on file  . Highest education level: Not on file  Occupational History  . Not on file  Tobacco Use  . Smoking status: Current Every Day Smoker    Packs/day: 0.10    Years: 14.00    Pack years: 1.40    Types: Cigarettes  . Smokeless tobacco: Never Used  Vaping Use  . Vaping Use: Never used  Substance and Sexual Activity  . Alcohol use: Yes    Alcohol/week: 1.0 standard drink    Types: 1 Cans of beer per week    Comment: SOCIAL  . Drug use: Not Currently    Types: Cocaine  . Sexual activity: Yes    Birth control/protection: None  Other Topics Concern  . Not on file  Social History Narrative  . Not on file   Social Determinants of Health   Financial Resource Strain: Not on file  Food Insecurity: Not on  file  Transportation Needs: Not on file  Physical Activity: Not on file  Stress: Not on file  Social Connections: Not on file   Allergies  Allergen Reactions  . Haldol [Haloperidol Lactate]     Throat swelling  . Amoxicillin Rash    Did it involve swelling of the face/tongue/throat, SOB, or low BP? Unknown Did it involve sudden or severe rash/hives, skin peeling, or any reaction on the inside of your mouth or nose? Yes Did you need to seek medical attention at a hospital or doctor's office? Yes When did it last happen?Childhood reaction. If all above answers are "NO", may proceed with cephalosporin use.     Medications  (Not in a hospital admission)    Vitals   Vitals:   05/09/20 1700 05/09/20 1715  05/09/20 1730 05/09/20 1745  BP:   127/69 132/87  Pulse: 73 (!) 102 89 92  Resp: 20 12 (!) 26 (!) 21  SpO2: 100% 100% 100% 100%     There is no height or weight on file to calculate BMI.  Physical Exam   General: Laying comfortably in bed; in no acute distress. HENT: Normal oropharynx and mucosa. Normal external appearance of ears and nose. Neck: Supple, no pain or tenderness CV: No JVD. No peripheral edema. Pulmonary: Symmetric Chest rise. Normal respiratory effort. Abdomen: Soft to touch, non-tender.  Ext: No cyanosis, edema, or deformity  Skin: No rash. Normal palpation of skin.   Musculoskeletal: Normal digits and nails by inspection. No clubbing.   Neurologic Examination  Mental status/Cognition: Alert, oriented to self, place, month and year, good attention.  Speech/language: Fluent, comprehension intact, object naming intact, repetition intact.  Cranial nerves:   CN II Pupils equal and reactive to light, no VF deficits    CN III,IV,VI EOM intact, no gaze preference or deviation, no nystagmus    CN V normal sensation in V1, V2, and V3 segments bilaterally. Endorses some subjective tingling in left face.   CN VII no asymmetry, no nasolabial fold flattening    CN VIII normal hearing to speech    CN IX & X normal palatal elevation, no uvular deviation    CN XI 5/5 head turn and 5/5 shoulder shrug bilaterally   CN XII midline tongue protrusion   Motor:  Muscle bulk: normal, tone normal, pronator drift none tremor none Mvmt Root Nerve  Muscle Right Left Comments  SA C5/6 Ax Deltoid 5 5   EF C5/6 Mc Biceps 5 5   EE C6/7/8 Rad Triceps 5 5   WF C6/7 Med FCR 5 5   WE C7/8 PIN ECU 5 5   F Ab C8/T1 U ADM/FDI 5 5   HF L1/2/3 Fem Illopsoas 5 5   KE L2/3/4 Fem Quad 5 5   DF L4/5 D Peron Tib Ant 5 5   PF S1/2 Tibial Grc/Sol 5 5    Reflexes:  Right Left Comments  Pectoralis      Biceps (C5/6) 2 2   Brachioradialis (C5/6) 2 2    Triceps (C6/7) 2 2    Patellar (L3/4) 2 2     Achilles (S1)      Hoffman      Plantar     Jaw jerk    Sensation:  Light touch Intact throughout, has some subjective Left facial tingling but no objective deficit to touc hand pinprick.   Pin prick    Temperature    Vibration   Proprioception    Coordination/Complex Motor:  -  Finger to Nose intact BL - Heel to shin intact BL - Rapid alternating movement is normal - Gait: Deferred.  Labs   CBC:  Recent Labs  Lab 05/09/20 1640  WBC 7.5  NEUTROABS 4.0  HGB 10.6*  HCT 32.9*  MCV 76.7*  PLT 446*    Basic Metabolic Panel:  Lab Results  Component Value Date   NA 137 05/09/2020   K 4.2 05/09/2020   CO2 25 05/09/2020   GLUCOSE 107 (H) 05/09/2020   BUN 17 05/09/2020   CREATININE 1.08 (H) 05/09/2020   CALCIUM 9.2 05/09/2020   GFRNONAA >60 05/09/2020   GFRAA >60 02/10/2019   Lipid Panel: No results found for: LDLCALC HgbA1c: No results found for: HGBA1C Urine Drug Screen:     Component Value Date/Time   LABOPIA POSITIVE (A) 02/16/2019 0828   COCAINSCRNUR NONE DETECTED 02/16/2019 0828   LABBENZ NONE DETECTED 02/16/2019 0828   AMPHETMU NONE DETECTED 02/16/2019 0828   THCU NONE DETECTED 02/16/2019 0828   LABBARB NONE DETECTED 02/16/2019 0828    Alcohol Level     Component Value Date/Time   ETH <5 07/11/2016 1141    CT Head without contrast: CTH was negative for a large hypodensity concerning for a large territory infarct or hyperdensity concerning for an ICH  MRI Brain  Pending.  Impression   Shereen Marton is a 30 y.o. female with PMH significant for headaches, depression, GERD who presents with acute onset L facial tingling and stuttering speech with some word finding difficulty and holocephalic throbbing headache. Symptoms resolving by the time I evaluated her in the ED with NIHSS of 0 and no focal deficit on neuro exam. She does endorse a history of headaches but never had anything like this. She denies any history of migraine. My suspicion for this  being a stroke or a TIA is low based on her having subjective tingling(positive symptom) rather than numbness, no clear aphasia on my evaluation and throbbing headache at the time of onset of her symptoms. Her tingling and headache is spontaneously getting better.  Recommendations  - Recommend MRI Brain without contrast along with headache cocktail. No further workup if MRI Brain is negative. ______________________________________________________________________   Thank you for the opportunity to take part in the care of this patient. If you have any further questions, please contact the neurology consultation attending.  Signed,  Erick Blinks [facility] Pager Number [phone] _ _ _   _ __   _ __ _ _  __ __   _ __   __ _

## 2020-05-09 NOTE — ED Notes (Signed)
fsbs 111.

## 2020-05-09 NOTE — ED Notes (Signed)
Neurologist at bedside in CT.  

## 2020-05-09 NOTE — ED Notes (Signed)
Pt arrives in CT

## 2020-05-09 NOTE — ED Notes (Signed)
Pt alert  Speech clear.  Pt talking on cell phone.

## 2020-05-10 ENCOUNTER — Encounter: Payer: Self-pay | Admitting: Podiatry

## 2020-05-10 ENCOUNTER — Other Ambulatory Visit: Payer: Self-pay | Admitting: Podiatry

## 2020-05-10 MED ORDER — OXYCODONE-ACETAMINOPHEN 5-325 MG PO TABS: 1.0000 | ORAL_TABLET | ORAL | 0 refills | Status: DC | PRN

## 2020-05-11 ENCOUNTER — Telehealth: Payer: Self-pay | Admitting: Podiatry

## 2020-05-11 NOTE — Telephone Encounter (Signed)
Patients pcp called in to let you know patient has substance abuse history and goes to ER frequently to obtain pain meds, they also wanted you to be aware she tests positive for cocaine frequently, Please Advise

## 2020-05-16 ENCOUNTER — Telehealth: Payer: Self-pay

## 2020-05-16 NOTE — Telephone Encounter (Signed)
Pt called and stated that the [facility] clinic had her scheduled for tomorrow but had to cancel because they do not accept her insurance. She would like to get a new referral to another pain clinic.   Pt would also like pain meds sent to [facility] [place].

## 2020-05-18 ENCOUNTER — Telehealth: Payer: Self-pay | Admitting: Podiatry

## 2020-05-18 NOTE — Telephone Encounter (Signed)
Patient called in requesting pain meds, provider is currently unavailable

## 2020-05-18 NOTE — Telephone Encounter (Signed)
Pt calling regarding refill

## 2020-05-19 ENCOUNTER — Other Ambulatory Visit: Payer: Self-pay | Admitting: Podiatry

## 2020-05-22 ENCOUNTER — Other Ambulatory Visit: Payer: Self-pay | Admitting: Podiatry

## 2020-05-22 ENCOUNTER — Ambulatory Visit (INDEPENDENT_AMBULATORY_CARE_PROVIDER_SITE_OTHER): Payer: Medicaid Other | Admitting: Internal Medicine

## 2020-05-22 DIAGNOSIS — E669 Obesity, unspecified: Secondary | ICD-10-CM

## 2020-05-22 DIAGNOSIS — F411 Generalized anxiety disorder: Secondary | ICD-10-CM | POA: Diagnosis not present

## 2020-05-22 DIAGNOSIS — G4733 Obstructive sleep apnea (adult) (pediatric): Secondary | ICD-10-CM

## 2020-05-22 DIAGNOSIS — Z8659 Personal history of other mental and behavioral disorders: Secondary | ICD-10-CM | POA: Diagnosis not present

## 2020-05-22 NOTE — Progress Notes (Signed)
Sleep Medicine   Office Visit  Patient Name: Madison Singleton DOB: 02-Aug-1990 MRN [id number]    Chief Complaint: study results/sleep apnea  Brief History:  Devinn Was seen for initial consultation to discuss the results of her recent sleep study demonstrating mild sleep apnea. The patient's bed partner reports  Loud snoring at night. The patient relates the following symptoms: waking feeling as if she  Was not breathing, reflux and morning headaches. are also present. The patient goes to sleep at 11 p.m. and wakes up at 7 a.m. feeling tired. Sleep is restless.    Patient has noted no restlessness of her legs at night.  The patient  relates no unusual behavior during the night.  The patient reports a history of daytime sleepiness. She will nap during the day but denies sleepiness while driving.The Epworth Sleepiness Score is 16 out of 24 .  The patient relates  Cardiovascular risk factors include: none The patient reports problems with concentration.    ROS  General: (-) fever, (-) chills, (-) night sweat Nose and Sinuses: (-) nasal stuffiness or itchiness, (-) postnasal drip, (-) nosebleeds, (-) sinus trouble. Mouth and Throat: (-) sore throat, (-) hoarseness. Neck: (-) swollen glands, (-) enlarged thyroid, (-) neck pain. Respiratory: + cough, - shortness of breath, - wheezing. Neurologic: + numbness, + tingling. Psychiatric: + anxiety, + depression Sleep behavior: -sleep paralysis -hypnogogic hallucinations -dream enactment      -vivid dreams -cataplexy -night terrors -sleep walking   Current Medication: Outpatient Encounter Medications as of 05/22/2020  Medication Sig  . cyclobenzaprine (FLEXERIL) 10 MG tablet Take 1 tablet (10 mg total) by mouth 3 (three) times daily as needed. for muscle spams  . cyclobenzaprine (FLEXERIL) 10 MG tablet Take 1 tablet (10 mg total) by mouth 3 (three) times daily as needed for muscle spasms.  . cyclobenzaprine (FLEXERIL) 5 MG tablet Take 1 tablet (5  mg total) by mouth 3 (three) times daily as needed.  . famotidine (PEPCID) 20 MG tablet TAKE 1 TABLET BY MOUTH TWICE DAILY AS NEEDED FOR HEARTBURN  . gabapentin (NEURONTIN) 400 MG capsule Take 1 capsule (400 mg total) by mouth 3 (three) times daily.  . hydrOXYzine (ATARAX/VISTARIL) 25 MG tablet Take 25 mg by mouth 2 (two) times daily as needed.  Marland Kitchen ibuprofen (ADVIL) 800 MG tablet Take 1 tablet (800 mg total) by mouth every 6 (six) hours as needed.  Marland Kitchen ibuprofen (ADVIL) 800 MG tablet Take 1 tablet (800 mg total) by mouth every 6 (six) hours as needed.  Marland Kitchen ketorolac (TORADOL) 10 MG tablet Take 1 tablet (10 mg total) by mouth every 6 (six) hours as needed.  Marland Kitchen oxyCODONE-acetaminophen (PERCOCET) 10-325 MG tablet Take 1 tablet by mouth every 4 (four) hours as needed for pain.  Marland Kitchen oxyCODONE-acetaminophen (PERCOCET) 5-325 MG tablet Take 1-2 tablets by mouth every 4 (four) hours as needed for severe pain.  Marland Kitchen oxyCODONE-acetaminophen (PERCOCET) 5-325 MG tablet Take 1-2 tablets by mouth every 4 (four) hours as needed for severe pain.  Marland Kitchen oxyCODONE-acetaminophen (PERCOCET) 5-325 MG tablet Take 1-2 tablets by mouth every 4 (four) hours as needed for severe pain.  Marland Kitchen oxyCODONE-acetaminophen (PERCOCET) 5-325 MG tablet Take 1-2 tablets by mouth every 4 (four) hours as needed for severe pain.  Marland Kitchen oxyCODONE-acetaminophen (PERCOCET) 5-325 MG tablet Take 1-2 tablets by mouth every 4 (four) hours as needed for severe pain.  Marland Kitchen oxyCODONE-acetaminophen (PERCOCET) 5-325 MG tablet Take 1-2 tablets by mouth every 4 (four) hours as needed for severe pain.  Marland Kitchen  oxyCODONE-acetaminophen (PERCOCET) 5-325 MG tablet Take 1-2 tablets by mouth every 4 (four) hours as needed for severe pain.  Marland Kitchen oxyCODONE-acetaminophen (PERCOCET) 5-325 MG tablet Take 1-2 tablets by mouth every 4 (four) hours as needed for severe pain.  Marland Kitchen oxyCODONE-acetaminophen (PERCOCET) 5-325 MG tablet Take 1-2 tablets by mouth every 4 (four) hours as needed for severe pain.  Marland Kitchen  oxyCODONE-acetaminophen (PERCOCET) 5-325 MG tablet Take 1-2 tablets by mouth every 4 (four) hours as needed for severe pain.  Marland Kitchen oxyCODONE-acetaminophen (PERCOCET) 5-325 MG tablet Take 1-2 tablets by mouth every 4 (four) hours as needed for severe pain.  Marland Kitchen oxyCODONE-acetaminophen (PERCOCET) 5-325 MG tablet Take 1-2 tablets by mouth every 4 (four) hours as needed for severe pain.  Marland Kitchen oxyCODONE-acetaminophen (PERCOCET) 5-325 MG tablet Take 1-2 tablets by mouth every 4 (four) hours as needed for severe pain.  . [DISCONTINUED] QUEtiapine (SEROQUEL) 100 MG tablet Take by mouth at bedtime.   No facility-administered encounter medications on file as of 05/22/2020.    Surgical History: Past Surgical History:  Procedure Laterality Date  . KNEE ARTHROSCOPY WITH MEDIAL MENISECTOMY Right 09/07/2015   Procedure: Arthroscopic Lateral Release ;  Surgeon: Kennedy Bucker, MD;  Location: [facility] ORS;  Service: Orthopedics;  Laterality: Right;  . MOUTH SURGERY  12/2018  . ORIF ANKLE FRACTURE Right 02/16/2019   Procedure: OPEN REDUCTION INTERNAL FIXATION (ORIF) ANKLE FRACTURE;  Surgeon: Lyndle Herrlich, MD;  Location: [facility] ORS;  Service: Orthopedics;  Laterality: Right;    Medical History: Past Medical History:  Diagnosis Date  . Anxiety    NO MEDS  . Breast mass   . Breast tenderness in female    3;00 x3 weeks  . Depression    NO MEDS  . GERD (gastroesophageal reflux disease)    OCC-NO MEDS  . Headache    HAS RECENTLY STARTED HAVING HA'S MORE FREQUENTLY    Family History: Non contributory to the present illness  Social History: Social History   Socioeconomic History  . Marital status: Married    Spouse name: Not on file  . Number of children: Not on file  . Years of education: Not on file  . Highest education level: Not on file  Occupational History  . Not on file  Tobacco Use  . Smoking status: Current Every Day Smoker    Packs/day: 0.10    Years: 14.00    Pack years: 1.40    Types:  Cigarettes  . Smokeless tobacco: Never Used  Vaping Use  . Vaping Use: Never used  Substance and Sexual Activity  . Alcohol use: Yes    Alcohol/week: 1.0 standard drink    Types: 1 Cans of beer per week    Comment: SOCIAL  . Drug use: Not Currently    Types: Cocaine  . Sexual activity: Yes    Birth control/protection: None  Other Topics Concern  . Not on file  Social History Narrative  . Not on file   Social Determinants of Health   Financial Resource Strain: Not on file  Food Insecurity: Not on file  Transportation Needs: Not on file  Physical Activity: Not on file  Stress: Not on file  Social Connections: Not on file  Intimate Partner Violence: Not on file    Vital Signs: Blood pressure 111/74, pulse 93, temperature 98.8 F (37.1 C), temperature source Temporal, resp. rate 16, height  (1.549 m), weight 174 lb (78.9 kg), last menstrual period 04/24/2020, SpO2 98 %, unknown if currently breastfeeding.  Examination: General  Appearance: The patient is well-developed, well-nourished, and in no distress. Neck Circumference:  Skin: Gross inspection of skin unremarkable. Head: normocephalic, no gross deformities. Eyes: no gross deformities noted. ENT: ears appear grossly normal Neurologic: Alert and oriented. No involuntary movements.    EPWORTH SLEEPINESS SCALE:  Scale:  (0)= no chance of dozing; (1)= slight chance of dozing; (2)= moderate chance of dozing; (3)= high chance of dozing  Chance  Situtation    Sitting and reading: 2    Watching TV: 2    Sitting Inactive in public: 2    As a passenger in car: 3      Lying down to rest: 3    Sitting and talking: 1    Sitting quielty after lunch: 3    In a car, stopped in traffic: 0   TOTAL SCORE:   16 out of 24    SLEEP STUDIES:  1. PSG 04/26/20 AHI 12 SpO2Min 87%   LABS: Recent Results (from the past 2160 hour(s))  Protime-INR     Status: None   Collection Time: 05/09/20  4:40 PM  Result Value  Ref Range   Prothrombin Time 12.9 11.4 - 15.2 seconds   INR 1.0 0.8 - 1.2    Comment: (NOTE) INR goal varies based on device and disease states. Performed at [facility], [address] [place], [place] [address]   APTT     Status: None   Collection Time: 05/09/20  4:40 PM  Result Value Ref Range   aPTT 28 24 - 36 seconds    Comment: Performed at [facility], [address] [place], [place] [address]  CBC     Status: Abnormal   Collection Time: 05/09/20  4:40 PM  Result Value Ref Range   WBC 7.5 4.0 - 10.5 K/uL   RBC 4.29 3.87 - 5.11 MIL/uL   Hemoglobin 10.6 (L) 12.0 - 15.0 g/dL   HCT 42.7 (L) 06.2 - 37.6 %   MCV 76.7 (L) 80.0 - 100.0 fL   MCH 24.7 (L) 26.0 - 34.0 pg   MCHC 32.2 30.0 - 36.0 g/dL   RDW 28.3 (H) 15.1 - 76.1 %   Platelets 446 (H) 150 - 400 K/uL   nRBC 0.0 0.0 - 0.2 %    Comment: Performed at [facility], [address] [place], [place] [address]  Differential     Status: None   Collection Time: 05/09/20  4:40 PM  Result Value Ref Range   Neutrophils Relative % 54 %   Neutro Abs 4.0 1.7 - 7.7 K/uL   Lymphocytes Relative 33 %   Lymphs Abs 2.5 0.7 - 4.0 K/uL   Monocytes Relative 13 %   Monocytes Absolute 0.9 0.1 - 1.0 K/uL   Eosinophils Relative 0 %   Eosinophils Absolute 0.0 0.0 - 0.5 K/uL   Basophils Relative 0 %   Basophils Absolute 0.0 0.0 - 0.1 K/uL   Immature Granulocytes 0 %   Abs Immature Granulocytes 0.02 0.00 - 0.07 K/uL    Comment: Performed at [facility], [address] [place], [place] [address]  Comprehensive metabolic panel     Status: Abnormal   Collection Time: 05/09/20  4:40 PM  Result Value Ref Range   Sodium 137 135 - 145 mmol/L   Potassium 4.2 3.5 - 5.1 mmol/L   Chloride 104 98 - 111 mmol/L   CO2 25 22 - 32 mmol/L   Glucose, Bld 107 (H) 70 - 99 mg/dL  Comment: Glucose reference range applies only to samples taken after fasting for at least 8 hours.   BUN 17 6 - 20 mg/dL    Creatinine, Ser 3.01 (H) 0.44 - 1.00 mg/dL   Calcium 9.2 8.9 - 60.1 mg/dL   Total Protein 7.2 6.5 - 8.1 g/dL   Albumin 3.9 3.5 - 5.0 g/dL   AST 25 15 - 41 U/L   ALT 25 0 - 44 U/L   Alkaline Phosphatase 63 38 - 126 U/L   Total Bilirubin 0.6 0.3 - 1.2 mg/dL   GFR, Estimated >09 >32 mL/min    Comment: (NOTE) Calculated using the CKD-EPI Creatinine Equation (2021)    Anion gap 8 5 - 15    Comment: Performed at [facility], [address], [place], [place] [address]  Troponin I (High Sensitivity)     Status: None   Collection Time: 05/09/20  4:40 PM  Result Value Ref Range   Troponin I (High Sensitivity) 4 <18 ng/L    Comment: (NOTE) Elevated high sensitivity troponin I (hsTnI) values and significant  changes across serial measurements may suggest ACS but many other  chronic and acute conditions are known to elevate hsTnI results.  Refer to the "Links" section for chest pain algorithms and additional  guidance. Performed at [facility], [address] [place], [place] [address]   CBG monitoring, ED     Status: Abnormal   Collection Time: 05/09/20  4:55 PM  Result Value Ref Range   Glucose-Capillary 111 (H) 70 - 99 mg/dL    Comment: Glucose reference range applies only to samples taken after fasting for at least 8 hours.    Radiology: MR BRAIN WO CONTRAST  Result Date: 05/09/2020 CLINICAL DATA:  Headache and left facial numbness EXAM: MRI HEAD WITHOUT CONTRAST TECHNIQUE: Multiplanar, multiecho pulse sequences of the brain and surrounding structures were obtained without intravenous contrast. COMPARISON:  None. FINDINGS: Brain: No acute infarct, mass effect or extra-axial collection. No acute or chronic hemorrhage. Normal white matter signal, parenchymal volume and CSF spaces. The midline structures are normal. Vascular: Major flow voids are preserved. Skull and upper cervical spine: Normal calvarium and skull base. Visualized upper cervical spine and soft tissues  are normal. Sinuses/Orbits:No paranasal sinus fluid levels or advanced mucosal thickening. No mastoid or middle ear effusion. Normal orbits. IMPRESSION: Normal brain MRI. Electronically Signed   By: Deatra Robinson M.D.   On: 05/09/2020 21:25   CT HEAD CODE STROKE WO CONTRAST  Result Date: 05/09/2020 CLINICAL DATA:  Code stroke. Headache, slurred speech, numbness left side face EXAM: CT HEAD WITHOUT CONTRAST TECHNIQUE: Contiguous axial images were obtained from the base of the skull through the vertex without intravenous contrast. COMPARISON:  02/05/2019 FINDINGS: Brain: There is no acute intracranial hemorrhage, mass effect, or edema. Gray-white differentiation is preserved. There is no extra-axial collection. Ventricles and sulci are normal in size and configuration. Vascular: No hyperdense vessel. Skull: Unremarkable Sinuses/Orbits: Aerated.  No acute orbital abnormality. Other: Mastoid air cells are clear. ASPECTS (Alberta Stroke Program Early CT Score) - Ganglionic level infarction (caudate, lentiform nuclei, internal capsule, insula, M1-M3 cortex): 7 - Supraganglionic infarction (M4-M6 cortex): 3 Total score (0-10 with 10 being normal): 10 IMPRESSION: There is no acute intracranial hemorrhage or evidence of acute infarction. ASPECT score is 10. These results were called by telephone at the time of interpretation on 05/09/2020 at 4:44 pm to provider [facility] , who verbally acknowledged these results. Electronically Signed   By: Elaina Hoops  Patel M.D.   On: 05/09/2020 16:46    No results found.  MR BRAIN WO CONTRAST  Result Date: 05/09/2020 CLINICAL DATA:  Headache and left facial numbness EXAM: MRI HEAD WITHOUT CONTRAST TECHNIQUE: Multiplanar, multiecho pulse sequences of the brain and surrounding structures were obtained without intravenous contrast. COMPARISON:  None. FINDINGS: Brain: No acute infarct, mass effect or extra-axial collection. No acute or chronic hemorrhage. Normal white matter  signal, parenchymal volume and CSF spaces. The midline structures are normal. Vascular: Major flow voids are preserved. Skull and upper cervical spine: Normal calvarium and skull base. Visualized upper cervical spine and soft tissues are normal. Sinuses/Orbits:No paranasal sinus fluid levels or advanced mucosal thickening. No mastoid or middle ear effusion. Normal orbits. IMPRESSION: Normal brain MRI. Electronically Signed   By: Deatra RobinsonKevin  Herman M.D.   On: 05/09/2020 21:25   CT HEAD CODE STROKE WO CONTRAST  Result Date: 05/09/2020 CLINICAL DATA:  Code stroke. Headache, slurred speech, numbness left side face EXAM: CT HEAD WITHOUT CONTRAST TECHNIQUE: Contiguous axial images were obtained from the base of the skull through the vertex without intravenous contrast. COMPARISON:  02/05/2019 FINDINGS: Brain: There is no acute intracranial hemorrhage, mass effect, or edema. Gray-white differentiation is preserved. There is no extra-axial collection. Ventricles and sulci are normal in size and configuration. Vascular: No hyperdense vessel. Skull: Unremarkable Sinuses/Orbits: Aerated.  No acute orbital abnormality. Other: Mastoid air cells are clear. ASPECTS (Alberta Stroke Program Early CT Score) - Ganglionic level infarction (caudate, lentiform nuclei, internal capsule, insula, M1-M3 cortex): 7 - Supraganglionic infarction (M4-M6 cortex): 3 Total score (0-10 with 10 being normal): 10 IMPRESSION: There is no acute intracranial hemorrhage or evidence of acute infarction. ASPECT score is 10. These results were called by telephone at the time of interpretation on 05/09/2020 at 4:44 pm to provider [facility] PADUCHOWSKI , who verbally acknowledged these results. Electronically Signed   By: Guadlupe SpanishPraneil  Patel M.D.   On: 05/09/2020 16:46      Assessment and Plan: Patient Active Problem List   Diagnosis Date Noted  . Supervision of high risk pregnancy in third trimester 03/25/2018  . Pelvic pressure in pregnancy, antepartum, third  trimester 02/26/2018  . Generalized anxiety disorder 07/11/2016  . Cocaine abuse (HCC) 07/11/2016  . History of depression 03/12/2013     PLAN OSA:   The study results, diagnosis and treatment recommendations were discussed with the patient. She has mild sleep apnea along with a history ofo loud snoring, gasping, restless and poorly refreshing sleep and daytime sleepiness PA trial of nasal APAP at 5-20 cm H2O is recommended.  1. OSA (obstructive sleep apnea) APAP 5-20 cm H2O will be ordered. Follow up 30+days after set up.  2. Generalized anxiety disorder Not taking daily medication for this anymore, followed by PCP.  3. History of depression Not taking daily medication for this anymore, followed by PCP.  4. Obesity (BMI 30.0-34.9) Obesity Counseling: Had a lengthy discussion regarding patients BMI and weight issues. Patient was instructed on portion control as well as increased activity. Also discussed caloric restrictions with trying to maintain intake less than 2000 Kcal. Discussions were made in accordance with the 5As of weight management. Simple actions such as not eating late and if able to, taking a walk is suggested.    General Counseling: I have discussed the findings of the evaluation and examination with Anjelina.  I have also discussed any further diagnostic evaluation thatmay be needed or ordered today. Tarri verbalizes understanding of the findings of todays visit.  We also reviewed her medications today and discussed drug interactions and side effects including but not limited excessive drowsiness and altered mental states. We also discussed that there is always a risk not just to her but also people around her. she has been encouraged to call the office with any questions or concerns that should arise related to todays visit.  No orders of the defined types were placed in this encounter.      I have personally obtained a history, evaluated the patient, evaluated  pertinent data, formulated the assessment and plan and placed orders.  This patient was seen by Madison Ito, PA-C in collaboration with Dr. Freda Munro as a part of collaborative care agreement.   Valentino Hue Sol Blazing, PhD, FAASM  Diplomate, American Board of Sleep Medicine    Yevonne Pax, MD [facility] Diplomate ABMS Pulmonary and Critical Care Medicine Sleep medicine

## 2020-05-22 NOTE — Telephone Encounter (Signed)
Pt calling regarding pain meds.

## 2020-05-22 NOTE — Telephone Encounter (Signed)
Please advise 

## 2020-05-23 ENCOUNTER — Other Ambulatory Visit: Payer: Self-pay | Admitting: Podiatry

## 2020-05-23 ENCOUNTER — Encounter: Payer: Self-pay | Admitting: Podiatry

## 2020-05-23 MED ORDER — OXYCODONE-ACETAMINOPHEN 5-325 MG PO TABS: 1.0000 | ORAL_TABLET | ORAL | 0 refills | Status: DC | PRN

## 2020-05-23 MED ORDER — OXYCODONE-ACETAMINOPHEN 2.5-325 MG PO TABS: 1.0000 | ORAL_TABLET | ORAL | 0 refills | Status: DC | PRN

## 2020-05-23 NOTE — Telephone Encounter (Signed)
"  Has he called in my medicine yet?"  He has not sent it in yet.  "What's the problem?"  I am not sure.  He's not in the office this morning.  I can send him another message for you.  "Thank you."

## 2020-05-24 ENCOUNTER — Encounter: Payer: Self-pay | Admitting: Neurology

## 2020-05-26 ENCOUNTER — Telehealth: Payer: Self-pay

## 2020-05-26 NOTE — Telephone Encounter (Signed)
Called pt to inform her that [facility] management is trying to reach her to get scheduled for an appt. Phone number is [phone].

## 2020-05-27 IMAGING — DX DG ANKLE 2V *R*
2 series · 2 of 2 positions shown · non-contrast
Comparison: None.

CLINICAL DATA: Post reduction right ankle due to a fall today. post
reduction

EXAM:
RIGHT ANKLE - 2 VIEW

[ankle ap]
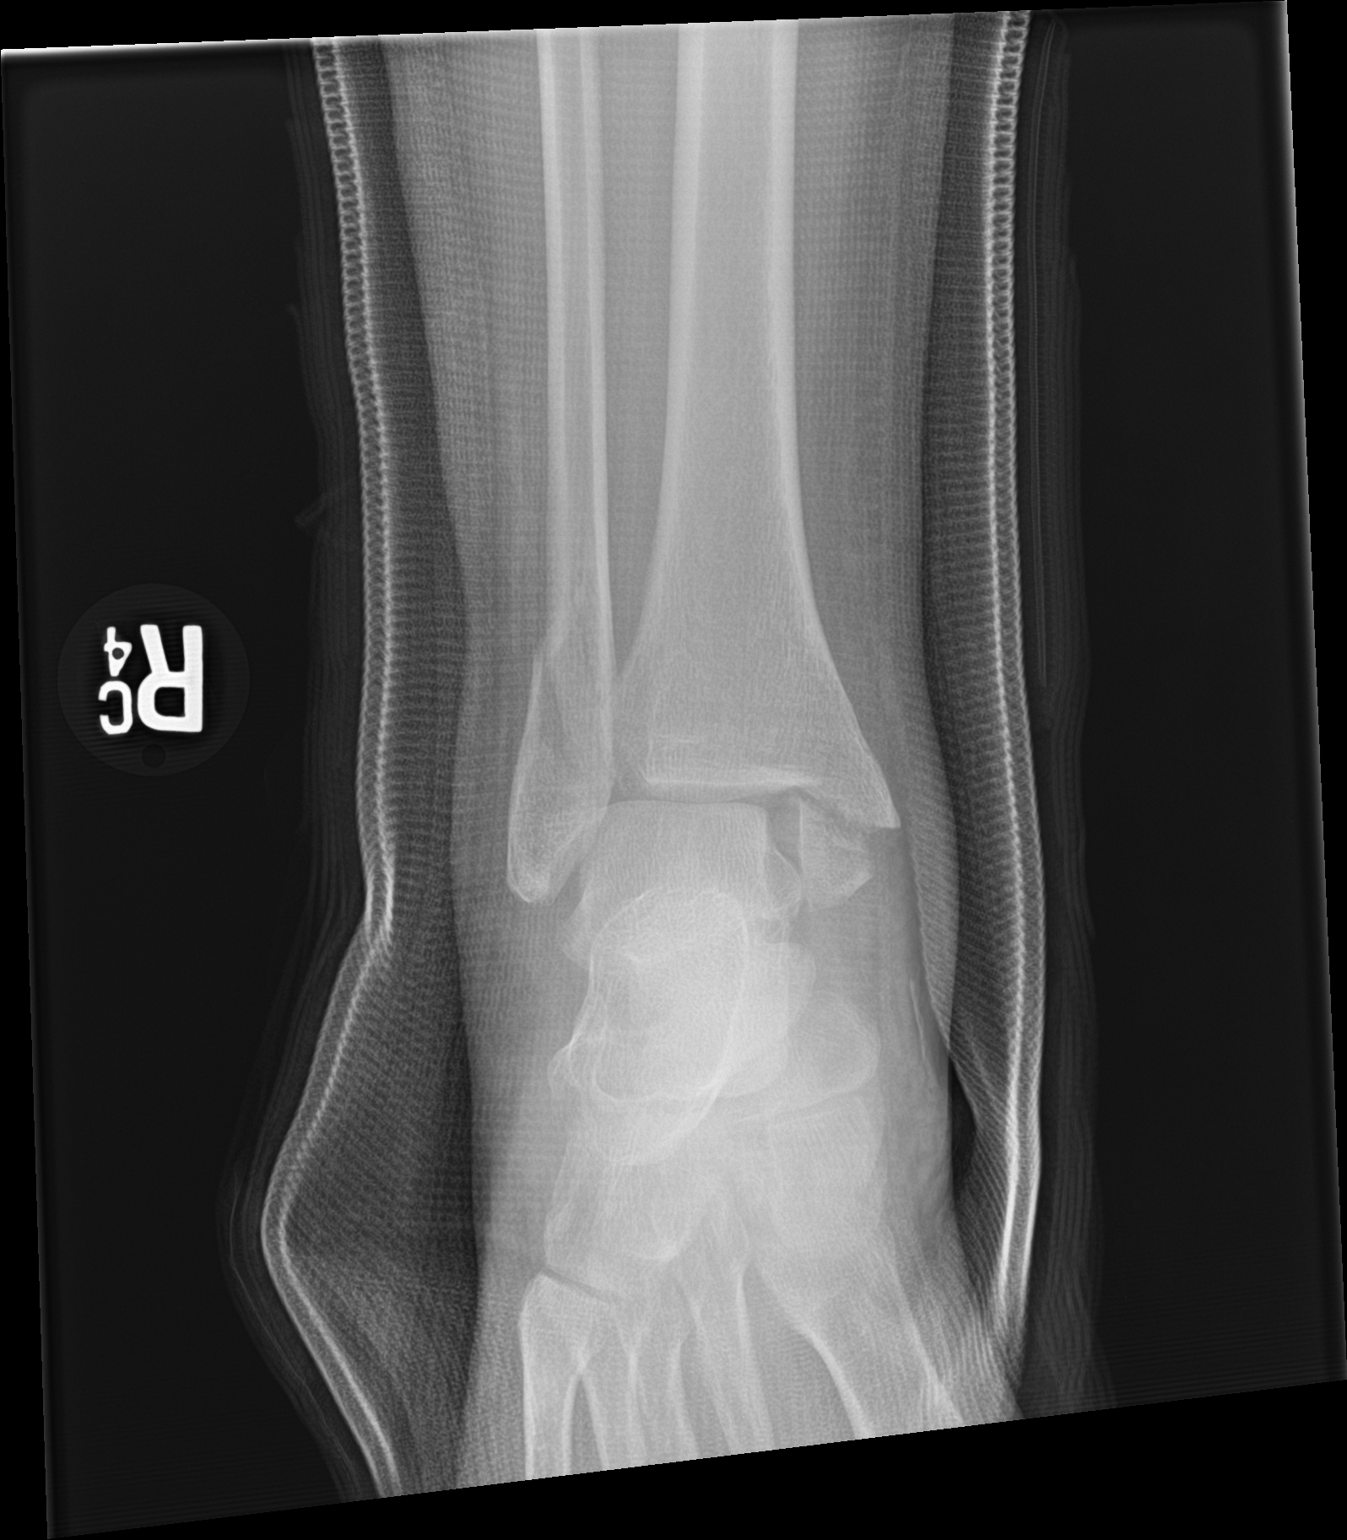

[ankle lat]
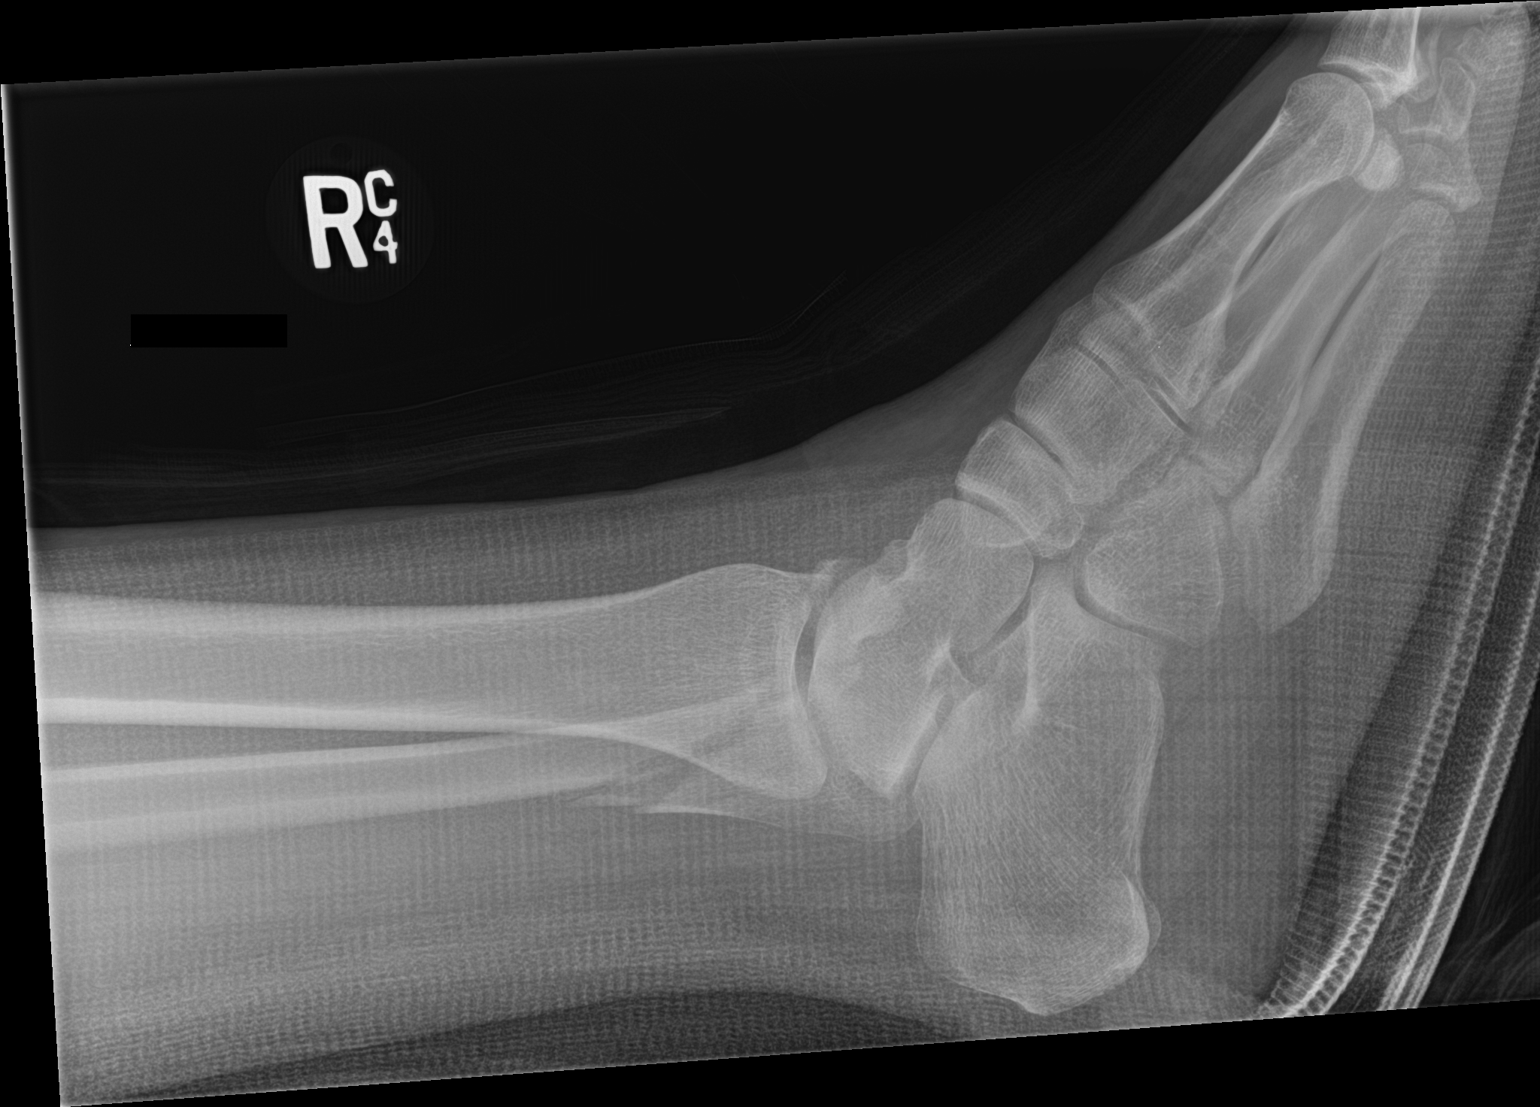

[2 of 2 positions shown; findings below may reference images not displayed]

FINDINGS: Interval splinting and reduction of trimalleolar ankle fracture.
Improved alignment of the ankle mortise. Fine detail obscured by
splint material.
IMPRESSION: Reduction of trimalleolar ankle fracture. Improved alignment ankle
mortise.

## 2020-05-29 ENCOUNTER — Telehealth: Payer: Self-pay | Admitting: Podiatry

## 2020-05-29 NOTE — Telephone Encounter (Signed)
Patient called into call center, once greeted patient disconnected call

## 2020-05-30 ENCOUNTER — Other Ambulatory Visit: Payer: Self-pay | Admitting: Podiatry

## 2020-05-30 NOTE — Telephone Encounter (Signed)
Pt called in to request pain meds. I advised the pt to contact [facility] to schedule appt.

## 2020-05-30 NOTE — Telephone Encounter (Signed)
Spoke with [facility] pain and they stated that the pt is scheduled for 06/05/2020 at 11am with Dr Vear Clock.

## 2020-05-31 MED ORDER — OXYCODONE-ACETAMINOPHEN 5-325 MG PO TABS: 1.0000 | ORAL_TABLET | ORAL | 0 refills | Status: DC | PRN

## 2020-05-31 NOTE — Telephone Encounter (Signed)
Pt called back regarding pain meds.

## 2020-06-06 ENCOUNTER — Telehealth: Payer: Self-pay | Admitting: *Deleted

## 2020-06-06 NOTE — Telephone Encounter (Signed)
Pt called in to follow up on pain med refilll. Pt stated that the [facility] pain clinic was going to give her something to send a shock to her ankle and she declined. She also stated that [facility] did not give her any medications or anything and she wants to either be sent to another pain clinic or scheduled for surgery.

## 2020-06-06 NOTE — Telephone Encounter (Signed)
"  I went to the pain clinic yesterday.  They said that if Dr. Allena Katz wanted to continue to prescribe the pain medication it is up to him.  I'm in pain.  I need my pain medication or does he want to send me to another pain clinic or does he want to just go ahead and schedule the surgery.  Can you ask him to call my medicine in please?"  I will let him know.

## 2020-06-06 NOTE — Telephone Encounter (Signed)
Lvm for pt to call back. 

## 2020-06-06 NOTE — Telephone Encounter (Signed)
Unfortunately I am not able to manage her pain.  We have attempted multiple referrals to multiple different clinic for which she has been declined or refused to participate.  She is not an ideal candidate for surgery as her pain has not been controlled given her history.

## 2020-06-07 ENCOUNTER — Telehealth: Payer: Self-pay | Admitting: *Deleted

## 2020-06-07 NOTE — Telephone Encounter (Signed)
"  I am calling because I called [facility] and told them I wanted that machine.  It is coming in the mail.  So, I want Dr. Allena Katz to prescribe me my medicine until I get my machine."  Dr. Allena Katz cannot prescribe you any more pain medicine.  "How do you know?  Who are you to tell me this?"  I am a certified medical assistant and Dr. Allena Katz has informed me that he cannot prescribe you any more pain medication because he has referred you to pain management.  He cannot prescribe you pain medication long term.  "Well my foot is swollen really bad and I can hardly walk!  Just schedule me an appointment to see him!"  Dr. Allena Katz does not have anything available this week.  His next available date is 06/15/2020.  "Okay, schedule me for then."  He can see you at 3:45 pm.  "That's fine."

## 2020-06-07 NOTE — Telephone Encounter (Signed)
Pt advised that her pain medication would be managed by pain management. Advised pt that she could contact her insurance company to see which pain management clinics were in network with her insurance. strongly suggested that she give the [facility] pain management a call back and try the suggested approach but the pt declined. pt asked about surgery and I explained to her that at this time she was not a candidate for surgery at this time because her pain must first be controlled. she stated that she was not going back to [facility] and may call [employer] to see if they can find another pain clinic.

## 2020-06-12 ENCOUNTER — Other Ambulatory Visit: Payer: Self-pay

## 2020-06-12 ENCOUNTER — Emergency Department: Payer: Medicaid Other

## 2020-06-12 ENCOUNTER — Emergency Department
Admission: EM | Admit: 2020-06-12 | Discharge: 2020-06-12 | Disposition: A | Discharge status: Home / Self Care | Payer: Medicaid Other | Attending: Emergency Medicine | Admitting: Emergency Medicine

## 2020-06-12 DIAGNOSIS — S8002XA Contusion of left knee, initial encounter: Secondary | ICD-10-CM | POA: Insufficient documentation

## 2020-06-12 DIAGNOSIS — S8001XA Contusion of right knee, initial encounter: Secondary | ICD-10-CM | POA: Insufficient documentation

## 2020-06-12 DIAGNOSIS — Z79899 Other long term (current) drug therapy: Secondary | ICD-10-CM | POA: Diagnosis not present

## 2020-06-12 DIAGNOSIS — Z23 Encounter for immunization: Secondary | ICD-10-CM | POA: Insufficient documentation

## 2020-06-12 DIAGNOSIS — T07XXXA Unspecified multiple injuries, initial encounter: Secondary | ICD-10-CM

## 2020-06-12 DIAGNOSIS — S0990XA Unspecified injury of head, initial encounter: Secondary | ICD-10-CM | POA: Diagnosis present

## 2020-06-12 DIAGNOSIS — F17210 Nicotine dependence, cigarettes, uncomplicated: Secondary | ICD-10-CM | POA: Insufficient documentation

## 2020-06-12 DIAGNOSIS — S2002XA Contusion of left breast, initial encounter: Secondary | ICD-10-CM | POA: Insufficient documentation

## 2020-06-12 DIAGNOSIS — S0083XA Contusion of other part of head, initial encounter: Secondary | ICD-10-CM | POA: Insufficient documentation

## 2020-06-12 DIAGNOSIS — S40022A Contusion of left upper arm, initial encounter: Secondary | ICD-10-CM | POA: Diagnosis not present

## 2020-06-12 LAB — URINALYSIS, COMPLETE (UACMP) WITH MICROSCOPIC
Bilirubin Urine: NEGATIVE
Glucose, UA: NEGATIVE mg/dL
Hgb urine dipstick: NEGATIVE
Ketones, ur: NEGATIVE mg/dL
Nitrite: NEGATIVE
Protein, ur: NEGATIVE mg/dL
Specific Gravity, Urine: 1.021 (ref 1.005–1.030)
pH: 6 (ref 5.0–8.0)

## 2020-06-12 LAB — POC URINE PREG, ED: Preg Test, Ur: NEGATIVE

## 2020-06-12 MED ORDER — DOXYCYCLINE HYCLATE 100 MG PO CAPS: 100.0000 mg | ORAL_CAPSULE | Freq: Two times a day (BID) | ORAL | 0 refills | Status: DC

## 2020-06-12 MED ORDER — DOXYCYCLINE HYCLATE 100 MG PO CAPS
100.0000 mg | ORAL_CAPSULE | Freq: Two times a day (BID) | ORAL | 0 refills | Status: DC
Start: 2020-06-12 — End: 2020-06-15

## 2020-06-12 MED ORDER — TETANUS-DIPHTH-ACELL PERTUSSIS 5-2.5-18.5 LF-MCG/0.5 IM SUSY
0.5000 mL | PREFILLED_SYRINGE | Freq: Once | INTRAMUSCULAR | Status: AC
  Administered 2020-06-12: 0.5 mL via INTRAMUSCULAR
  Filled 2020-06-12: qty 0.5

## 2020-06-12 MED ORDER — OXYCODONE-ACETAMINOPHEN 5-325 MG PO TABS
1.0000 | ORAL_TABLET | ORAL | Status: DC | PRN
  Administered 2020-06-12: 1 via ORAL
  Filled 2020-06-12: qty 1

## 2020-06-12 MED ORDER — NAPROXEN 500 MG PO TABS: 500.0000 mg | ORAL_TABLET | Freq: Two times a day (BID) | ORAL | 0 refills | Status: DC

## 2020-06-12 MED ORDER — METRONIDAZOLE 500 MG PO TABS: 500.0000 mg | ORAL_TABLET | Freq: Two times a day (BID) | ORAL | 0 refills | Status: DC
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
## 2020-06-15 ENCOUNTER — Encounter: Payer: Self-pay | Admitting: Podiatry

## 2020-06-15 ENCOUNTER — Ambulatory Visit: Payer: Medicaid Other | Admitting: Podiatry

## 2020-06-15 ENCOUNTER — Other Ambulatory Visit: Payer: Self-pay

## 2020-06-15 DIAGNOSIS — G894 Chronic pain syndrome: Secondary | ICD-10-CM

## 2020-06-15 DIAGNOSIS — M19079 Primary osteoarthritis, unspecified ankle and foot: Secondary | ICD-10-CM

## 2020-06-15 NOTE — Progress Notes (Signed)
Subjective:  Patient ID: Madison Singleton, female    DOB: [birth year],  MRN: [id number]  Chief Complaint  Patient presents with  . Ankle Pain    She is still c/o of a lot of pain in foot and ankle   She states that she was offered a tens unit from the pain clinic but she declined and has not returned to pain clinic at [facility]    30 y.o. female presents with the above complaint.  Patient presents with continuous right ankle pain.  Patient states it painful to touch.  She still has a lot of pain to that area.  She wants to know if she can get pain medication from me.  She is being followed at pain management as well.  L.   Review of Systems: Negative except as noted in the HPI. Denies N/V/F/Ch.  Past Medical History:  Diagnosis Date  . Anxiety    NO MEDS  . Breast mass   . Breast tenderness in female    3;00 x3 weeks  . Depression    NO MEDS  . GERD (gastroesophageal reflux disease)    OCC-NO MEDS  . Headache    HAS RECENTLY STARTED HAVING HA'S MORE FREQUENTLY    Current Outpatient Medications:  .  famotidine (PEPCID) 20 MG tablet, TAKE 1 TABLET BY MOUTH TWICE DAILY AS NEEDED FOR HEARTBURN, Disp: , Rfl:  .  hydrOXYzine (ATARAX/VISTARIL) 25 MG tablet, Take 25 mg by mouth 2 (two) times daily as needed., Disp: , Rfl:  .  metroNIDAZOLE (FLAGYL) 500 MG tablet, Take 1 tablet (500 mg total) by mouth 2 (two) times daily., Disp: 14 tablet, Rfl: 0 .  oxyCODONE-acetaminophen (PERCOCET) 5-325 MG tablet, Take 1-2 tablets by mouth every 4 (four) hours as needed for severe pain., Disp: 30 tablet, Rfl: 0  Social History   Tobacco Use  Smoking Status Current Every Day Smoker  . Packs/day: 0.10  . Years: 14.00  . Pack years: 1.40  . Types: Cigarettes  Smokeless Tobacco Never Used    Allergies  Allergen Reactions  . Haldol [Haloperidol Lactate]     Throat swelling  . Amoxicillin Rash    Did it involve swelling of the face/tongue/throat, SOB, or low BP? Unknown Did it involve sudden or  severe rash/hives, skin peeling, or any reaction on the inside of your mouth or nose? Yes Did you need to seek medical attention at a hospital or doctor's office? Yes When did it last happen?Childhood reaction. If all above answers are "NO", may proceed with cephalosporin use.    Objective:  There were no vitals filed for this visit. There is no height or weight on file to calculate BMI. Constitutional Well developed. Well nourished.  Vascular Dorsalis pedis pulses palpable bilaterally. Posterior tibial pulses palpable bilaterally. Capillary refill normal to all digits.  No cyanosis or clubbing noted. Pedal hair growth normal.  Neurologic Normal speech. Oriented to person, place, and time. Epicritic sensation to light touch grossly present bilaterally.  Dermatologic Nails well groomed and normal in appearance. No open wounds. No skin lesions.  Orthopedic:  Pain on palpation to the medial lateral gutter of the ankle joint.  Pain with range of motion of the ankle joint.  These are both consistent with active and passive.   Radiographs: 3 views of skeletally mature adult right ankle: Osteoarthritic changes moderate in nature noted.  No joint osteophytes noted.  There is uneven decrease in joint space. Assessment:   1. Chronic pain syndrome   2.  Arthritis of ankle    Plan:  Patient was evaluated and treated and all questions answered.  Right ankle joint arthritis -I explained to the patient the etiology of arthritis and various treatment options were discussed.  I believe patient will benefit from a steroid injection given the severe Naitik nature of the arthritis that is present after undergoing arthroscopy.  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  I discussed with the patient that these could be attributed to signs of arthritis. -At this time I discussed with the patient extensive  detail surgical options are given that there is significant arthritis noted to the ankle joint and at this time patient may need ankle arthroplasty with an ankle implant or ankle fusion however if she is able to manage the pain I would like to hold off for as long as I can.  Patient agrees with the plan. -Patient is following up with pain management for further control of the pain.  At this time I will defer further pain management recommendation to them.   No follow-ups on file.

## 2020-06-27 ENCOUNTER — Encounter: Payer: Self-pay | Admitting: Gastroenterology

## 2020-07-12 ENCOUNTER — Ambulatory Visit: Payer: Medicaid Other | Admitting: Gastroenterology

## 2020-07-27 NOTE — Progress Notes (Deleted)
NEUROLOGY CONSULTATION NOTE  Madison Singleton MRN: [id number] DOB: Oct 20, 1990  Referring provider: Beverely Low, MD Primary care provider: Beverely Low, MD  Reason for consult:  Complicated migraine  Assessment/Plan:   ***   Subjective:  Madison Singleton is a 30 year old ***-handed female with chronic pain syndrome, depression, anxiety, PTSD, Bipolar disorder, tobacco use, and history of substance abuse who presents for complicated migraine.  History supplemented by ED note and referring provider's note.  CT and MRI brain personally reviewed.  On 05/09/2020, she woke up with headache and left sided facial tingling, blurred vision in left eye and difficulty getting words out.  Headache described as *** throbbing pain ***.  She went to the ED where ***.  CT and MRI of brain were negative.  She was treated with migraine cocktail.  ***     Current NSAIDS/analgesics:  Ibuprofen 800mg , oxycodone (chronic pain) Current triptans:  Sumatriptan 50mg  Current ergotamine:  none Current anti-emetic:  none Current muscle relaxants:  none Current Antihypertensive medications:  none Current Antidepressant medications:  none Current Anticonvulsant medications:  Gabapentin 300mg  QHS (for headache) Current anti-CGRP:  none Current Vitamins/Herbal/Supplements:  none Current Antihistamines/Decongestants:  Flonase Other therapy:  *** Hormone/birth control:  none Other medications:  ***  Past NSAIDS/analgesics:  Acetaminophen, hydrocodone, ketorolac, naproxen, tramadol Past abortive triptans:  *** Past abortive ergotamine:  *** Past muscle relaxants:  cyclobenzaprine Past anti-emetic:  *** Past antihypertensive medications:  *** Past antidepressant/antipsychotic medications:  Risperdal, Seroquel, Haldol Past anticonvulsant medications:  *** Past anti-CGRP:  *** Past vitamins/Herbal/Supplements:  *** Past antihistamines/decongestants:  Hydroxyzine, Benadryl Other past therapies:   ***  Caffeine:  *** Alcohol:  *** Smoker:  *** Diet:  *** Exercise:  *** Depression:  ***; Anxiety:  *** Other pain:  *** Sleep hygiene:  *** Family history of headache:  ***   PAST MEDICAL HISTORY: Past Medical History:  Diagnosis Date  . Anxiety    NO MEDS  . Arthritis of ankle   . Breast mass   . Breast tenderness in female    3;00 x3 weeks  . Chronic pain syndrome   . Depression    NO MEDS  . GERD (gastroesophageal reflux disease)    OCC-NO MEDS  . Headache    HAS RECENTLY STARTED HAVING HA'S MORE FREQUENTLY  . Obesity   . OSA (obstructive sleep apnea)     PAST SURGICAL HISTORY: Past Surgical History:  Procedure Laterality Date  . KNEE ARTHROSCOPY WITH MEDIAL MENISECTOMY Right 09/07/2015   Procedure: Arthroscopic Lateral Release ;  Surgeon: , MD;  Location: [facility] ORS;  Service: Orthopedics;  Laterality: Right;  . MOUTH SURGERY  12/2018  . ORIF ANKLE FRACTURE Right 02/16/2019   Procedure: OPEN REDUCTION INTERNAL FIXATION (ORIF) ANKLE FRACTURE;  Surgeon: Kennedy Bucker, MD;  Location: [facility] ORS;  Service: Orthopedics;  Laterality: Right;    MEDICATIONS: Current Outpatient Medications on File Prior to Visit  Medication Sig Dispense Refill  . famotidine (PEPCID) 20 MG tablet TAKE 1 TABLET BY MOUTH TWICE DAILY AS NEEDED FOR HEARTBURN    . hydrOXYzine (ATARAX/VISTARIL) 25 MG tablet Take 25 mg by mouth 2 (two) times daily as needed.    . metroNIDAZOLE (FLAGYL) 500 MG tablet Take 1 tablet (500 mg total) by mouth 2 (two) times daily. 14 tablet 0  . oxyCODONE-acetaminophen (PERCOCET) 5-325 MG tablet Take 1-2 tablets by mouth every 4 (four) hours as needed for severe pain. 30 tablet 0   No current facility-administered medications on file  prior to visit.    ALLERGIES: Allergies  Allergen Reactions  . Haldol [Haloperidol Lactate]     Throat swelling  . Amoxicillin Rash    Did it involve swelling of the face/tongue/throat, SOB, or low BP? Unknown Did it  involve sudden or severe rash/hives, skin peeling, or any reaction on the inside of your mouth or nose? Yes Did you need to seek medical attention at a hospital or doctor's office? Yes When did it last happen?Childhood reaction. If all above answers are "NO", may proceed with cephalosporin use.     FAMILY HISTORY: Family History  Problem Relation Age of Onset  . Breast cancer Cousin     Objective:  *** General: No acute distress.  Patient appears well-groomed.   Head:  Normocephalic/atraumatic Eyes:  fundi examined but not visualized Neck: supple, no paraspinal tenderness, full range of motion Back: No paraspinal tenderness Heart: regular rate and rhythm Lungs: Clear to auscultation bilaterally. Vascular: No carotid bruits. Neurological Exam: Mental status: alert and oriented to person, place, and time, recent and remote memory intact, fund of knowledge intact, attention and concentration intact, speech fluent and not dysarthric, language intact. Cranial nerves: CN I: not tested CN II: pupils equal, round and reactive to light, visual fields intact CN III, IV, VI:  full range of motion, no nystagmus, no ptosis CN V: facial sensation intact. CN VII: upper and lower face symmetric CN VIII: hearing intact CN IX, X: gag intact, uvula midline CN XI: sternocleidomastoid and trapezius muscles intact CN XII: tongue midline Bulk & Tone: normal, no fasciculations. Motor:  muscle strength 5/5 throughout Sensation:  Pinprick, temperature and vibratory sensation intact. Deep Tendon Reflexes:  2+ throughout,  toes downgoing.   Finger to nose testing:  Without dysmetria.   Heel to shin:  Without dysmetria.   Gait:  Normal station and stride.  Romberg negative.    Thank you for allowing me to take part in the care of this patient.  Shon Millet, DO  CC: Beverely Low, MD

## 2020-07-27 NOTE — Progress Notes (Signed)
Pt did not show for scheduled appointment. Office staff will reach out to reschedule.  

## 2020-07-28 ENCOUNTER — Ambulatory Visit: Payer: Medicaid Other | Admitting: Neurology

## 2020-07-31 ENCOUNTER — Ambulatory Visit: Payer: Medicaid Other | Admitting: Internal Medicine

## 2020-08-02 ENCOUNTER — Ambulatory Visit: Payer: Medicaid Other | Admitting: Gastroenterology

## 2020-08-15 NOTE — Progress Notes (Unsigned)
error 

## 2020-08-16 ENCOUNTER — Ambulatory Visit: Payer: Medicaid Other

## 2020-09-01 NOTE — Progress Notes (Signed)
Pt canceled her appointment. Office staff will reach out to reschedule.  

## 2020-09-04 ENCOUNTER — Ambulatory Visit: Payer: Medicaid Other | Admitting: Internal Medicine

## 2020-09-25 ENCOUNTER — Other Ambulatory Visit: Payer: Self-pay

## 2020-09-25 ENCOUNTER — Emergency Department
Admission: EM | Admit: 2020-09-25 | Discharge: 2020-09-25 | Disposition: A | Discharge status: Home / Self Care | Payer: Medicaid Other | Attending: Emergency Medicine | Admitting: Emergency Medicine

## 2020-09-25 ENCOUNTER — Emergency Department: Payer: Medicaid Other

## 2020-09-25 DIAGNOSIS — S060X0A Concussion without loss of consciousness, initial encounter: Secondary | ICD-10-CM | POA: Diagnosis not present

## 2020-09-25 DIAGNOSIS — S0990XA Unspecified injury of head, initial encounter: Secondary | ICD-10-CM | POA: Diagnosis present

## 2020-09-25 DIAGNOSIS — S0003XA Contusion of scalp, initial encounter: Secondary | ICD-10-CM | POA: Diagnosis not present

## 2020-09-25 DIAGNOSIS — S60222A Contusion of left hand, initial encounter: Secondary | ICD-10-CM | POA: Diagnosis not present

## 2020-09-25 DIAGNOSIS — F17210 Nicotine dependence, cigarettes, uncomplicated: Secondary | ICD-10-CM | POA: Diagnosis not present

## 2020-09-25 MED ORDER — NAPROXEN 500 MG PO TABS: 500.0000 mg | ORAL_TABLET | Freq: Two times a day (BID) | ORAL | 0 refills | Status: DC

## 2020-09-25 MED ORDER — ACETAMINOPHEN 500 MG PO TABS
ORAL_TABLET | ORAL | Status: AC
  Filled 2020-09-25: qty 2

## 2020-09-25 MED ORDER — NAPROXEN 500 MG PO TABS
500.0000 mg | ORAL_TABLET | Freq: Once | ORAL | Status: AC
  Administered 2020-09-25: 500 mg via ORAL
  Filled 2020-09-25: qty 1
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
## 2020-10-06 NOTE — Progress Notes (Deleted)
NEUROLOGY CONSULTATION NOTE  Madison Singleton MRN: [id number] DOB: 1990/08/22  Referring provider: Gabriel Rung Primary care provider: [facility]  Reason for consult:  complicated migraine  Assessment/Plan:   ***   Subjective:  Madison Singleton is a 30 year old female with depression, OSA and chronic pain syndrome who presents for complicated migraine.  History supplemented by referring provider's note.  Onset:  *** Location:  *** Quality:  *** Intensity:  ***.  *** denies new headache, thunderclap headache or severe headache that wakes *** from sleep. Aura:  *** Prodrome:  *** Postdrome:  *** Associated symptoms:  ***.  *** denies associated unilateral numbness or weakness. Duration:  *** Frequency:  *** Frequency of abortive medication: *** Triggers:  *** Relieving factors:  *** Activity:  ***  MRI of brain on 05/09/2020 personally reviewed was normal.  Current NSAIDS/analgesics:  ibuprofen 800mg , naproxen Current triptans:  sumatriptan 50mg  Current ergotamine:  none Current anti-emetic:  none Current muscle relaxants:  none Current Antihypertensive medications:  none Current Antidepressant medications:  none Current Anticonvulsant medications:  gabapentin 300mg  QHS (for headache) Current anti-CGRP:  none Current Vitamins/Herbal/Supplements:  none Current Antihistamines/Decongestants:  Flonase Other therapy:  *** Hormone/birth control:  none   Past NSAIDS/analgesics:  tramadol Past abortive triptans:  *** Past abortive ergotamine:  *** Past muscle relaxants:  Flexeril Past anti-emetic:  *** Past antihypertensive medications:  *** Past antidepressant medications:  Lexapro Past anticonvulsant medications:  *** Past anti-CGRP:  *** Past vitamins/Herbal/Supplements:  *** Past antihistamines/decongestants:  *** Other past therapies:  ***  Caffeine:  *** Alcohol:  *** Smoker:  *** Diet:  *** Exercise:  *** Depression:   ***; Anxiety:  *** Other pain:  *** Sleep hygiene:  *** Family history of headache:  ***      PAST MEDICAL HISTORY: Past Medical History:  Diagnosis Date   Anxiety    NO MEDS   Arthritis of ankle    Breast mass    Breast tenderness in female    3;00 x3 weeks   Chronic pain syndrome    Depression    NO MEDS   GERD (gastroesophageal reflux disease)    OCC-NO MEDS   Headache    HAS RECENTLY STARTED HAVING HA'S MORE FREQUENTLY   Obesity    OSA (obstructive sleep apnea)     PAST SURGICAL HISTORY: Past Surgical History:  Procedure Laterality Date   KNEE ARTHROSCOPY WITH MEDIAL MENISECTOMY Right 09/07/2015   Procedure: Arthroscopic Lateral Release ;  Surgeon: , MD;  Location: [facility] ORS;  Service: Orthopedics;  Laterality: Right;   MOUTH SURGERY  12/2018   ORIF ANKLE FRACTURE Right 02/16/2019   Procedure: OPEN REDUCTION INTERNAL FIXATION (ORIF) ANKLE FRACTURE;  Surgeon: Kennedy Bucker, MD;  Location: [facility] ORS;  Service: Orthopedics;  Laterality: Right;    MEDICATIONS: Current Outpatient Medications on File Prior to Visit  Medication Sig Dispense Refill   naproxen (NAPROSYN) 500 MG tablet Take 1 tablet (500 mg total) by mouth 2 (two) times daily with a meal. 20 tablet 0   No current facility-administered medications on file prior to visit.    ALLERGIES: Allergies  Allergen Reactions   Haldol [Haloperidol Lactate]     Throat swelling   Amoxicillin Rash    Did it involve swelling of the face/tongue/throat, SOB, or low BP? Unknown Did it involve sudden or severe rash/hives, skin peeling, or any reaction on the inside of your mouth or nose? Yes Did you need to seek  medical attention at a hospital or doctor's office? Yes When did it last happen? Childhood reaction. If all above answers are "NO", may proceed with cephalosporin use.     FAMILY HISTORY: Family History  Problem Relation Age of Onset   Breast cancer Cousin     Objective:  *** General: No  acute distress.  Patient appears well-groomed.   Head:  Normocephalic/atraumatic Eyes:  fundi examined but not visualized Neck: supple, no paraspinal tenderness, full range of motion Back: No paraspinal tenderness Heart: regular rate and rhythm Lungs: Clear to auscultation bilaterally. Vascular: No carotid bruits. Neurological Exam: Mental status: alert and oriented to person, place, and time, recent and remote memory intact, fund of knowledge intact, attention and concentration intact, speech fluent and not dysarthric, language intact. Cranial nerves: CN I: not tested CN II: pupils equal, round and reactive to light, visual fields intact CN III, IV, VI:  full range of motion, no nystagmus, no ptosis CN V: facial sensation intact. CN VII: upper and lower face symmetric CN VIII: hearing intact CN IX, X: gag intact, uvula midline CN XI: sternocleidomastoid and trapezius muscles intact CN XII: tongue midline Bulk & Tone: normal, no fasciculations. Motor:  muscle strength 5/5 throughout Sensation:  Pinprick, temperature and vibratory sensation intact. Deep Tendon Reflexes:  2+ throughout,  toes downgoing.   Finger to nose testing:  Without dysmetria.   Heel to shin:  Without dysmetria.   Gait:  Normal station and stride.  Romberg negative.    Thank you for allowing me to take part in the care of this patient.  Shon Millet, DO  CC: ***

## 2020-10-09 ENCOUNTER — Ambulatory Visit: Payer: Medicaid Other | Admitting: Neurology

## 2020-10-20 NOTE — Progress Notes (Unsigned)
Pt did not show today for scheduled appointment.  

## 2020-10-23 ENCOUNTER — Ambulatory Visit: Payer: Medicaid Other

## 2020-10-23 ENCOUNTER — Other Ambulatory Visit: Payer: Self-pay

## 2021-01-04 ENCOUNTER — Ambulatory Visit: Payer: Medicaid Other | Admitting: Podiatry

## 2021-01-04 ENCOUNTER — Other Ambulatory Visit: Payer: Self-pay

## 2021-01-04 DIAGNOSIS — M216X1 Other acquired deformities of right foot: Secondary | ICD-10-CM | POA: Diagnosis not present

## 2021-01-04 DIAGNOSIS — G894 Chronic pain syndrome: Secondary | ICD-10-CM

## 2021-01-04 DIAGNOSIS — M19079 Primary osteoarthritis, unspecified ankle and foot: Secondary | ICD-10-CM

## 2021-01-04 MED ORDER — METHYLPREDNISOLONE 4 MG PO TBPK: ORAL_TABLET | ORAL | 0 refills | Status: DC

## 2021-01-04 MED ORDER — OXYCODONE-ACETAMINOPHEN 5-325 MG PO TABS: 1.0000 | ORAL_TABLET | ORAL | 0 refills | Status: DC | PRN

## 2021-01-08 ENCOUNTER — Encounter: Payer: Self-pay | Admitting: Podiatry

## 2021-01-08 NOTE — Progress Notes (Signed)
Subjective:  Patient ID: Madison Singleton, female    DOB: 08/27/1990,  MRN: [id number]  Chief Complaint  Patient presents with   Foot Pain    Right foot pain     30 y.o. female presents with the above complaint.  Patient presents with continuous right ankle pain.  Patient states it painful to touch.  She still has a lot of pain to that area.  She wants to know if she can get pain medication from me.  She is no longer going to the pain management and she would like to discuss options for surgery.   Review of Systems: Negative except as noted in the HPI. Denies N/V/F/Ch.  Past Medical History:  Diagnosis Date   Anxiety    NO MEDS   Arthritis of ankle    Breast mass    Breast tenderness in female    3;00 x3 weeks   Chronic pain syndrome    Depression    NO MEDS   GERD (gastroesophageal reflux disease)    OCC-NO MEDS   Headache    HAS RECENTLY STARTED HAVING HA'S MORE FREQUENTLY   Obesity    OSA (obstructive sleep apnea)     Current Outpatient Medications:    methylPREDNISolone (MEDROL DOSEPAK) 4 MG TBPK tablet, Take as directed, Disp: 21 each, Rfl: 0   oxyCODONE-acetaminophen (PERCOCET) 5-325 MG tablet, Take 1 tablet by mouth every 4 (four) hours as needed for severe pain., Disp: 30 tablet, Rfl: 0   naproxen (NAPROSYN) 500 MG tablet, Take 1 tablet (500 mg total) by mouth 2 (two) times daily with a meal., Disp: 20 tablet, Rfl: 0  Social History   Tobacco Use  Smoking Status Every Day   Packs/day: 0.10   Years: 14.00   Pack years: 1.40   Types: Cigarettes  Smokeless Tobacco Never    Allergies  Allergen Reactions   Haldol [Haloperidol Lactate]     Throat swelling   Amoxicillin Rash    Did it involve swelling of the face/tongue/throat, SOB, or low BP? Unknown Did it involve sudden or severe rash/hives, skin peeling, or any reaction on the inside of your mouth or nose? Yes Did you need to seek medical attention at a hospital or doctor's office? Yes When did it last  happen? Childhood reaction. If all above answers are "NO", may proceed with cephalosporin use.    Objective:  There were no vitals filed for this visit. There is no height or weight on file to calculate BMI. Constitutional Well developed. Well nourished.  Vascular Dorsalis pedis pulses palpable bilaterally. Posterior tibial pulses palpable bilaterally. Capillary refill normal to all digits.  No cyanosis or clubbing noted. Pedal hair growth normal.  Neurologic Normal speech. Oriented to person, place, and time. Epicritic sensation to light touch grossly present bilaterally.  Dermatologic Nails well groomed and normal in appearance. No open wounds. No skin lesions.  Orthopedic:  Pain on palpation to the medial lateral gutter of the ankle joint.  Pain with range of motion of the ankle joint.  These are both consistent with active and passive.   Radiographs: 3 views of skeletally mature adult right ankle: Osteoarthritic changes moderate in nature noted.  No joint osteophytes noted.  There is uneven decrease in joint space. Assessment:   1. Arthritis of ankle   2. Chronic pain syndrome     Plan:  Patient was evaluated and treated and all questions answered.  Right ankle joint arthritis -I explained to the patient the etiology of arthritis  and various treatment options were discussed.  I believe patient will benefit from a steroid injection given the severe Naitik nature of the arthritis that is present after undergoing arthroscopy.  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  I discussed with the patient that these could be attributed to signs of arthritis. -At this time I discussed with the patient extensive detail surgical options are given that there is significant arthritis noted to the ankle joint and at this time patient may need ankle arthroplasty with an ankle implant or ankle  fusion  -Given that her pain has progressed to gotten worse I would like for her to see a specialist at [facility] for ankle replacement.  Patient agrees with the plan and would like to pursue ankle replacement.   No follow-ups on file.

## 2021-01-09 ENCOUNTER — Telehealth: Payer: Self-pay | Admitting: *Deleted

## 2021-01-09 NOTE — Telephone Encounter (Signed)
Faxed referral to [facility]-01/09/21,received confirmation , will contact patient to schedule as soon as referral is in their system.

## 2021-01-12 ENCOUNTER — Telehealth: Payer: Self-pay | Admitting: Podiatry

## 2021-01-12 ENCOUNTER — Other Ambulatory Visit: Payer: Self-pay | Admitting: Podiatry

## 2021-01-12 MED ORDER — OXYCODONE-ACETAMINOPHEN 5-325 MG PO TABS: 1.0000 | ORAL_TABLET | ORAL | 0 refills | Status: DC | PRN

## 2021-01-12 NOTE — Telephone Encounter (Signed)
Patient called asking for a refill on her pain medication. Patient states she has an appt next week with [facility] for ortho. Patient uses [facility] [place].

## 2021-02-07 ENCOUNTER — Other Ambulatory Visit: Payer: Self-pay | Admitting: Podiatry

## 2021-02-07 ENCOUNTER — Telehealth: Payer: Self-pay | Admitting: Podiatry

## 2021-02-07 MED ORDER — OXYCODONE-ACETAMINOPHEN 5-325 MG PO TABS: 1.0000 | ORAL_TABLET | ORAL | 0 refills | Status: DC | PRN

## 2021-02-07 NOTE — Telephone Encounter (Signed)
Pt call she needs more pain meds and also the place you sent her for surgery is no lounger doing surgery so they sending her some where she has to wait till dec 5th for surgery

## 2021-02-13 ENCOUNTER — Telehealth: Payer: Self-pay | Admitting: Podiatry

## 2021-02-13 MED ORDER — OXYCODONE-ACETAMINOPHEN 5-325 MG PO TABS: 1.0000 | ORAL_TABLET | ORAL | 0 refills | Status: DC | PRN

## 2021-02-13 NOTE — Telephone Encounter (Signed)
She needs a refill on pain meds

## 2021-02-13 NOTE — Telephone Encounter (Signed)
Patient called back asking if her pain medication was sent over. I asked pt if she made an appt with the doctor you referred too. Patient states she has an appt on 02/26/2021. I explained to patient her medication was too soon to refill.

## 2021-03-25 NOTE — L&D Delivery Note (Signed)
Delivery Note  First Stage: Labor onset: 2005 Augmentation : none Analgesia /Anesthesia intrapartum: Fentanyl IVPM x 1 SROM at 2005  Second Stage: Complete dilation at 2148 Onset of pushing at 2148 FHR second stage Cat II  Delivery of a viable female infant 10/23/21 at 2151 by CNM delivery of fetal head in OA position,  Loose nuchal cord x 1. Left Anterior then posterior shoulders delivered easily with gentle downward traction. Baby placed on mom's chest, and attended to by peds.  Cord double clamped after cessation of pulsation, cut by family member   Third Stage: Placenta delivered spontaneously intact with 3 VC @ 2155 Placenta disposition: routine disposal Uterine tone Firm / bleeding small - active 3rd stage mgmt due to grand multip, PItocin bolus with delivery of infant and cytotec PR given after placenta delivery  Intact vagina, perineum and cervix identified  Anesthesia for repair: n/a Est. Blood Loss (mL):  Complications: none  Mom to postpartum.  Baby to Couplet care / Skin to Skin.  Newborn: Birth Weight: pending  Apgar Scores: 8/9 Feeding planned: formula

## 2021-03-28 ENCOUNTER — Ambulatory Visit: Payer: Medicaid Other | Admitting: Neurology

## 2021-03-30 ENCOUNTER — Other Ambulatory Visit: Payer: Medicaid Other

## 2021-04-03 ENCOUNTER — Emergency Department: Payer: Medicaid Other

## 2021-04-03 ENCOUNTER — Encounter: Payer: Self-pay | Admitting: Emergency Medicine

## 2021-04-03 ENCOUNTER — Emergency Department
Admission: EM | Admit: 2021-04-03 | Discharge: 2021-04-03 | Disposition: A | Discharge status: Home / Self Care | Payer: Medicaid Other | Attending: Emergency Medicine | Admitting: Emergency Medicine

## 2021-04-03 DIAGNOSIS — X509XXA Other and unspecified overexertion or strenuous movements or postures, initial encounter: Secondary | ICD-10-CM | POA: Diagnosis not present

## 2021-04-03 DIAGNOSIS — S93401A Sprain of unspecified ligament of right ankle, initial encounter: Secondary | ICD-10-CM | POA: Insufficient documentation

## 2021-04-03 DIAGNOSIS — M25571 Pain in right ankle and joints of right foot: Secondary | ICD-10-CM | POA: Diagnosis present

## 2021-04-03 MED ORDER — OXYCODONE-ACETAMINOPHEN 5-325 MG PO TABS
1.0000 | ORAL_TABLET | Freq: Once | ORAL | Status: AC
  Administered 2021-04-03: 1 via ORAL
  Filled 2021-04-03: qty 1

## 2021-04-03 MED ORDER — CYCLOBENZAPRINE HCL 5 MG PO TABS: 5.0000 mg | ORAL_TABLET | Freq: Two times a day (BID) | ORAL | 0 refills | Status: DC | PRN

## 2021-04-03 NOTE — Discharge Instructions (Addendum)
Please be sure to follow up with your orthopedic surgeon and/or podiatrist.

## 2021-04-03 NOTE — ED Provider Notes (Addendum)
[facility] Provider Note    Event Date/Time   First MD Initiated Contact with Patient 04/03/21 1944     (approximate)   History   Right ankle pain   HPI  Lamoine Fredricksen is a 31 y.o. 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 The patient says she was coming down the steps when she twisted her ankle out to the right.  Patient denies any other injuries.  States that she is having a hard time putting weight on that ankle and moving then ankle.    Physical Exam   Triage Vital Signs: ED Triage Vitals [04/03/21 1945]  Enc Vitals Group     BP 128/78     Pulse Rate 60     Resp 20     Temp 98.5 F (36.9 C)     Temp src      SpO2 99 %                            Most recent vital signs: Vitals:   04/03/21 1945  BP: 128/78  Pulse: 60  Resp: 20  Temp: 98.5 F (36.9 C)  SpO2: 99%     General: Awake, no distress.  CV:  Good peripheral perfusion.  Resp:  Normal effort.  Abd:  No distention.  MSK:  Right ankle without deformity. Post surgical incision site scars present. DP 2+. No laceration.    ED Results / Procedures / Treatments   Labs (all labs ordered are listed, but only abnormal results are displayed) Labs Reviewed - No data to display   EKG  None   RADIOLOGY Right ankle x-ray My interpretation: No acute fracture Radiology interpretation:  IMPRESSION:  Remote posttraumatic/postsurgical changes in the right ankle. No  acute bony abnormality.     PROCEDURES:  Critical Care performed: No  Procedures   MEDICATIONS ORDERED IN ED: Medications - No data to display   IMPRESSION / MDM / ASSESSMENT AND PLAN / ED COURSE  I reviewed the triage vital signs and the nursing notes.                              Differential diagnosis includes, but is  not limited to, fracture, sprain, dislocation.  Patient presents to the emergency department today because of concerns for right ankle pain after fall.  Patient does have a history of chronic arthritis in that right ankle after an injury in 2020.  On exam today no laceration.  No obvious deformity.  Neurovascularly intact distally.  X-ray without any acute osseous abnormality.  I discussed this with the patient.  This time do think patient can be discharged to follow-up with orthopedic surgeon.  Patient did have questions about pain control going home.  I did discuss the Profen and Tylenol.  She stated that she would need something stronger did not know why we cannot give the Tylenol with oxycodone.  At this point given lack of acute osseous injury do not feel narcotics are warranted.  Will give patient muscle relaxer.  She states that she will follow-up with Dr. Allena Katz who will "give me something stronger". At this time given lack of acute osseous injury do not feel patient requires inpatient admission or emergent orthopedic  evaluation.  FINAL CLINICAL IMPRESSION(S) / ED DIAGNOSES   Final diagnoses:  Sprain of right ankle, unspecified ligament, initial encounter    Note:  This document was prepared using Dragon voice recognition software and may include unintentional dictation errors.     Phineas Semen, MD 04/03/21 2139    Phineas Semen, MD 04/03/21 [phone]

## 2021-04-04 ENCOUNTER — Telehealth: Payer: Self-pay | Admitting: Podiatry

## 2021-04-04 NOTE — Telephone Encounter (Signed)
Patient called stating she hurt her ankle that she had surgery on so she needs some pain medication. Patient states [facility] is referring her to you for this. Right ankle.

## 2021-04-10 ENCOUNTER — Ambulatory Visit (INDEPENDENT_AMBULATORY_CARE_PROVIDER_SITE_OTHER): Payer: Medicaid Other

## 2021-04-10 ENCOUNTER — Other Ambulatory Visit: Payer: Self-pay

## 2021-04-10 ENCOUNTER — Encounter: Payer: Self-pay | Admitting: Podiatry

## 2021-04-10 ENCOUNTER — Ambulatory Visit: Payer: Medicaid Other | Admitting: Podiatry

## 2021-04-10 DIAGNOSIS — S93431A Sprain of tibiofibular ligament of right ankle, initial encounter: Secondary | ICD-10-CM | POA: Diagnosis not present

## 2021-04-10 DIAGNOSIS — S93401A Sprain of unspecified ligament of right ankle, initial encounter: Secondary | ICD-10-CM

## 2021-04-10 MED ORDER — OXYCODONE-ACETAMINOPHEN 5-325 MG PO TABS: 1.0000 | ORAL_TABLET | ORAL | 0 refills | Status: DC | PRN

## 2021-04-11 NOTE — Progress Notes (Signed)
Subjective:  Patient ID: Madison Singleton, female    DOB: 1990-08-24,  MRN: [id number]  No chief complaint on file.   31 y.o. female presents with the above complaint.  Patient presents with right ankle pain feels like she may have broken the foot again.  She states she is twisted it really bad.  She states that she went to the urgent care where they told her that everything was going to put her in a splint as she been walking on it since.  She wanted to get it evaluated.  She went to a pain management doctor that essentially wanted to discuss conservative treatment options.  She states is hurting a lot.  She would like to discuss treatment options for this.  Is a little bit of swelling associated with it.   Review of Systems: Negative except as noted in the HPI. Denies N/V/F/Ch.  Past Medical History:  Diagnosis Date   Anxiety    NO MEDS   Arthritis of ankle    Breast mass    Breast tenderness in female    3;00 x3 weeks   Chronic pain syndrome    Depression    NO MEDS   GERD (gastroesophageal reflux disease)    OCC-NO MEDS   Headache    HAS RECENTLY STARTED HAVING HA'S MORE FREQUENTLY   Obesity    OSA (obstructive sleep apnea)     Current Outpatient Medications:    oxyCODONE-acetaminophen (PERCOCET) 5-325 MG tablet, Take 1 tablet by mouth every 4 (four) hours as needed for severe pain., Disp: 30 tablet, Rfl: 0   cyclobenzaprine (FLEXERIL) 5 MG tablet, Take 1 tablet (5 mg total) by mouth 2 (two) times daily as needed for muscle spasms., Disp: 10 tablet, Rfl: 0   methylPREDNISolone (MEDROL DOSEPAK) 4 MG TBPK tablet, Take as directed, Disp: 21 each, Rfl: 0   naproxen (NAPROSYN) 500 MG tablet, Take 1 tablet (500 mg total) by mouth 2 (two) times daily with a meal., Disp: 20 tablet, Rfl: 0   oxyCODONE-acetaminophen (PERCOCET) 5-325 MG tablet, Take 1 tablet by mouth every 4 (four) hours as needed for severe pain., Disp: 30 tablet, Rfl: 0   oxyCODONE-acetaminophen (PERCOCET) 5-325 MG  tablet, Take 1 tablet by mouth every 4 (four) hours as needed for severe pain., Disp: 30 tablet, Rfl: 0   oxyCODONE-acetaminophen (PERCOCET) 5-325 MG tablet, Take 1 tablet by mouth every 4 (four) hours as needed for severe pain., Disp: 30 tablet, Rfl: 0   oxyCODONE-acetaminophen (PERCOCET) 5-325 MG tablet, Take 1 tablet by mouth every 4 (four) hours as needed for severe pain., Disp: 30 tablet, Rfl: 0  Social History   Tobacco Use  Smoking Status Every Day   Packs/day: 0.10   Years: 14.00   Pack years: 1.40   Types: Cigarettes  Smokeless Tobacco Never    Allergies  Allergen Reactions   Haldol [Haloperidol Lactate]     Throat swelling   Amoxicillin Rash    Did it involve swelling of the face/tongue/throat, SOB, or low BP? Unknown Did it involve sudden or severe rash/hives, skin peeling, or any reaction on the inside of your mouth or nose? Yes Did you need to seek medical attention at a hospital or doctor's office? Yes When did it last happen? Childhood reaction. If all above answers are "NO", may proceed with cephalosporin use.    Objective:  There were no vitals filed for this visit. There is no height or weight on file to calculate BMI. Constitutional Well developed. Well  nourished.  Vascular Dorsalis pedis pulses palpable bilaterally. Posterior tibial pulses palpable bilaterally. Capillary refill normal to all digits.  No cyanosis or clubbing noted. Pedal hair growth normal.  Neurologic Normal speech. Oriented to person, place, and time. Epicritic sensation to light touch grossly present bilaterally.  Dermatologic Nails well groomed and normal in appearance. No open wounds. No skin lesions.  Orthopedic: Pain on palpation to the right ankle pain with range of motion of the ankle joint.  Pain on medial and lateral malleolus.  No bruising or ecchymosis noted.  Mild swelling noted.   Radiographs: 3 views of skeletally mature the right ankle.  No new acute fractures noted  however there is a increase in medial clear space especially compared to last year's previous x-rays.  Patient may have had a syndesmotic injury. Assessment:   1. Sprain of right ankle, unspecified ligament, initial encounter   2. Syndesmotic disruption of right ankle, initial encounter    Plan:  Patient was evaluated and treated and all questions answered.  Right ankle syndesmotic injury versus ankle fracture versus ankle sprain -All questions and concerns were discussed with the patient in extensive detail -There is increasing in medial clear space that was not present in the previous x-rays from last year that may have gotten more aggravated since the fall.  Given the amount of pain that she is having she will benefit from pain medication to temporarily help with the pain.  I discussed with her that we will plan on doing a CT scan to assess the medial clear space and assess any other fractures that are not seen on radiographs.  Patient states understanding -CT scan was ordered -She will be in a cam boot nonweightbearing to the right lower extremity -If patient does require surgery unfortunately I am not sure if I am going to be able to manage her pain as she has chronic pain syndrome and is dependent on Percocet.  I will discuss with her at the reduction of Percocet even if he undergoes surgery.  She states understanding.  No follow-ups on file.

## 2021-04-13 ENCOUNTER — Telehealth: Payer: Self-pay | Admitting: Podiatry

## 2021-04-13 NOTE — Telephone Encounter (Signed)
Need refilll on pain meds

## 2021-04-16 ENCOUNTER — Telehealth: Payer: Self-pay | Admitting: *Deleted

## 2021-04-16 NOTE — Telephone Encounter (Signed)
"  Has he called my pain medicine in yet?"  It doesn't look like he has called it in at this time.

## 2021-04-16 NOTE — Telephone Encounter (Signed)
"  I just called [facility] and they said they have not received a referral from you all.  I have a fax number that they said you can send it to.  It's [phone]."  I will send them the message.

## 2021-04-16 NOTE — Telephone Encounter (Signed)
"  I'm calling to get a refill on my pain medication.  I also need to know the phone number of the place he's sending me for the CT scan."

## 2021-04-17 ENCOUNTER — Other Ambulatory Visit: Payer: Self-pay | Admitting: Podiatry

## 2021-04-17 ENCOUNTER — Telehealth: Payer: Self-pay | Admitting: Podiatry

## 2021-04-17 MED ORDER — OXYCODONE-ACETAMINOPHEN 5-325 MG PO TABS: 1.0000 | ORAL_TABLET | ORAL | 0 refills | Status: DC | PRN

## 2021-04-17 NOTE — Telephone Encounter (Signed)
Patient called asking for RX refill for pain medication. Pt uses [address]. Pt also states that [facility] states they have not  received an order for her CT.

## 2021-04-23 ENCOUNTER — Other Ambulatory Visit: Payer: Medicaid Other

## 2021-04-23 NOTE — Telephone Encounter (Signed)
Patient called stating she would like a refill of pain medication.

## 2021-04-24 ENCOUNTER — Telehealth: Payer: Self-pay | Admitting: Podiatry

## 2021-04-24 MED ORDER — OXYCODONE-ACETAMINOPHEN 5-325 MG PO TABS: 1.0000 | ORAL_TABLET | ORAL | 0 refills | Status: DC | PRN

## 2021-04-24 NOTE — Telephone Encounter (Signed)
Patient called for a refill on pain meds 

## 2021-04-24 NOTE — Addendum Note (Signed)
Addended by: Boneta Lucks on: 04/24/2021 12:36 PM   Modules accepted: Orders

## 2021-05-01 ENCOUNTER — Other Ambulatory Visit: Payer: Self-pay | Admitting: Podiatry

## 2021-05-01 ENCOUNTER — Telehealth: Payer: Self-pay

## 2021-05-01 ENCOUNTER — Telehealth: Payer: Self-pay | Admitting: Podiatry

## 2021-05-01 MED ORDER — OXYCODONE-ACETAMINOPHEN 5-325 MG PO TABS: 1.0000 | ORAL_TABLET | ORAL | 0 refills | Status: DC | PRN

## 2021-05-01 NOTE — Telephone Encounter (Signed)
Patient called wanting a refill of her pain medication.

## 2021-05-01 NOTE — Telephone Encounter (Signed)
Refill sent. - Dr. Muneeb Veras

## 2021-05-01 NOTE — Telephone Encounter (Signed)
Patient called requesting a refill of pain medication.    Please advise ..  

## 2021-05-01 NOTE — Telephone Encounter (Signed)
Patient notified of medication via voice mail 

## 2021-05-03 ENCOUNTER — Other Ambulatory Visit: Payer: Medicaid Other

## 2021-05-08 ENCOUNTER — Ambulatory Visit: Payer: Medicaid Other | Admitting: Podiatry

## 2021-05-08 ENCOUNTER — Other Ambulatory Visit: Payer: Self-pay

## 2021-05-08 DIAGNOSIS — S93401A Sprain of unspecified ligament of right ankle, initial encounter: Secondary | ICD-10-CM

## 2021-05-08 DIAGNOSIS — M19079 Primary osteoarthritis, unspecified ankle and foot: Secondary | ICD-10-CM

## 2021-05-08 DIAGNOSIS — S93431A Sprain of tibiofibular ligament of right ankle, initial encounter: Secondary | ICD-10-CM | POA: Diagnosis not present

## 2021-05-08 MED ORDER — OXYCODONE-ACETAMINOPHEN 5-325 MG PO TABS: 1.0000 | ORAL_TABLET | ORAL | 0 refills | Status: DC | PRN

## 2021-05-08 NOTE — Telephone Encounter (Signed)
I called the patient back and left her a voicemail. 

## 2021-05-08 NOTE — Progress Notes (Signed)
Subjective:  Patient ID: Madison Singleton, female    DOB: [birth year],  MRN: [id number]  Chief Complaint  Patient presents with   Foot Pain    Right ankle pain     31 y.o. female presents with the above complaint.  Patient presents for follow-up right ankle sprain/instability.  Patient states that she feels a lot better in the boot.  She is not able to transition out of the boot.  She denies any other acute complaints.  She has a CT scan scheduled for the 16th.   Review of Systems: Negative except as noted in the HPI. Denies N/V/F/Ch.  Past Medical History:  Diagnosis Date   Anxiety    NO MEDS   Arthritis of ankle    Breast mass    Breast tenderness in female    3;00 x3 weeks   Chronic pain syndrome    Depression    NO MEDS   GERD (gastroesophageal reflux disease)    OCC-NO MEDS   Headache    HAS RECENTLY STARTED HAVING HA'S MORE FREQUENTLY   Obesity    OSA (obstructive sleep apnea)     Current Outpatient Medications:    oxyCODONE-acetaminophen (PERCOCET) 5-325 MG tablet, Take 1 tablet by mouth every 4 (four) hours as needed for severe pain., Disp: 30 tablet, Rfl: 0   cyclobenzaprine (FLEXERIL) 5 MG tablet, Take 1 tablet (5 mg total) by mouth 2 (two) times daily as needed for muscle spasms., Disp: 10 tablet, Rfl: 0   methylPREDNISolone (MEDROL DOSEPAK) 4 MG TBPK tablet, Take as directed, Disp: 21 each, Rfl: 0   naproxen (NAPROSYN) 500 MG tablet, Take 1 tablet (500 mg total) by mouth 2 (two) times daily with a meal., Disp: 20 tablet, Rfl: 0   oxyCODONE-acetaminophen (PERCOCET) 5-325 MG tablet, Take 1 tablet by mouth every 4 (four) hours as needed for severe pain., Disp: 30 tablet, Rfl: 0  Social History   Tobacco Use  Smoking Status Every Day   Packs/day: 0.10   Years: 14.00   Pack years: 1.40   Types: Cigarettes  Smokeless Tobacco Never    Allergies  Allergen Reactions   Haldol [Haloperidol Lactate]     Throat swelling   Amoxicillin Rash    Did it involve  swelling of the face/tongue/throat, SOB, or low BP? Unknown Did it involve sudden or severe rash/hives, skin peeling, or any reaction on the inside of your mouth or nose? Yes Did you need to seek medical attention at a hospital or doctor's office? Yes When did it last happen? Childhood reaction. If all above answers are "NO", may proceed with cephalosporin use.    Objective:  There were no vitals filed for this visit. There is no height or weight on file to calculate BMI. Constitutional Well developed. Well nourished.  Vascular Dorsalis pedis pulses palpable bilaterally. Posterior tibial pulses palpable bilaterally. Capillary refill normal to all digits.  No cyanosis or clubbing noted. Pedal hair growth normal.  Neurologic Normal speech. Oriented to person, place, and time. Epicritic sensation to light touch grossly present bilaterally.  Dermatologic Nails well groomed and normal in appearance. No open wounds. No skin lesions.  Orthopedic: Pain on palpation to the right ankle pain with range of motion of the ankle joint.  Pain on medial and lateral malleolus.  No bruising or ecchymosis noted.  Mild swelling noted.   Radiographs: 3 views of skeletally mature the right ankle.  No new acute fractures noted however there is a increase in medial clear space  especially compared to last year's previous x-rays.  Patient may have had a syndesmotic injury. Assessment:   1. Sprain of right ankle, unspecified ligament, initial encounter   2. Syndesmotic disruption of right ankle, initial encounter   3. Arthritis of ankle     Plan:  Patient was evaluated and treated and all questions answered.  Right ankle syndesmotic injury versus ankle fracture versus ankle sprain -All questions and concerns were discussed with the patient in extensive detail -There is increasing in medial clear space that was not present in the previous x-rays from last year that may have gotten more aggravated since the  fall.  Given the amount of pain that she is having she will benefit from pain medication to temporarily help with the pain.  I discussed with her that we will plan on doing a CT scan to assess the medial clear space and assess any other fractures that are not seen on radiographs.  Patient states understanding -Awaiting CT scan -Continue to in a cam boot nonweightbearing to the right lower extremity -If patient does require surgery unfortunately I am not sure if I am going to be able to manage her pain as she has chronic pain syndrome and is dependent on Percocet.  I will discuss with her at the reduction of Percocet even if he undergoes surgery.  She states understanding.  No follow-ups on file.

## 2021-05-10 ENCOUNTER — Inpatient Hospital Stay: Admission: RE | Admit: 2021-05-10 | Payer: Medicaid Other | Source: Ambulatory Visit

## 2021-05-15 ENCOUNTER — Telehealth: Payer: Self-pay | Admitting: Podiatry

## 2021-05-15 NOTE — Telephone Encounter (Signed)
Patient called stating she needs a RX refill of her pain medication.

## 2021-05-16 ENCOUNTER — Ambulatory Visit
Admission: RE | Admit: 2021-05-16 | Discharge: 2021-05-16 | Disposition: A | Discharge status: Home / Self Care | Payer: Medicaid Other | Source: Ambulatory Visit | Attending: Podiatry | Admitting: Podiatry

## 2021-05-16 ENCOUNTER — Telehealth: Payer: Self-pay | Admitting: *Deleted

## 2021-05-16 ENCOUNTER — Telehealth: Payer: Self-pay | Admitting: Podiatry

## 2021-05-16 DIAGNOSIS — S93401A Sprain of unspecified ligament of right ankle, initial encounter: Secondary | ICD-10-CM

## 2021-05-16 MED ORDER — OXYCODONE-ACETAMINOPHEN 5-325 MG PO TABS: 1.0000 | ORAL_TABLET | ORAL | 0 refills | Status: DC | PRN

## 2021-05-16 NOTE — Telephone Encounter (Signed)
[facility] [address]   Patient had her Ct done today, she is requesting a refill on her pain medication

## 2021-05-16 NOTE — Telephone Encounter (Signed)
"  Can you let Dr. Posey Pronto know that I made it to my CT Scan today?  I need a refill of my pain meds."

## 2021-05-22 ENCOUNTER — Telehealth: Payer: Self-pay | Admitting: Podiatry

## 2021-05-22 NOTE — Telephone Encounter (Signed)
Patient called stating she needs refill of pain medication.

## 2021-05-23 ENCOUNTER — Telehealth: Payer: Self-pay | Admitting: Podiatry

## 2021-05-23 NOTE — Telephone Encounter (Signed)
Pt called and needs her oxycodone refilled. ?

## 2021-05-28 ENCOUNTER — Telehealth: Payer: Self-pay | Admitting: Podiatry

## 2021-05-28 NOTE — Telephone Encounter (Signed)
Pt calling for her pain medication refill.called on 3.1 as well. ?

## 2021-05-28 NOTE — Telephone Encounter (Signed)
Patient called again inquiring when will pain medication be called in .

## 2021-05-28 NOTE — Telephone Encounter (Signed)
Please advise for refill on a pain medicine

## 2021-05-28 NOTE — Telephone Encounter (Signed)
Pt called again requesting a refill on medication. Please advise.  ?

## 2021-05-28 NOTE — Telephone Encounter (Signed)
Please advise 

## 2021-05-29 ENCOUNTER — Encounter: Payer: Self-pay | Admitting: Podiatry

## 2021-05-29 MED ORDER — OXYCODONE-ACETAMINOPHEN 5-325 MG PO TABS: 1.0000 | ORAL_TABLET | ORAL | 0 refills | Status: DC | PRN

## 2021-05-29 NOTE — Telephone Encounter (Signed)
Left message for pt that medication has been sent in and to call if any further questions. ?

## 2021-06-04 ENCOUNTER — Telehealth: Payer: Self-pay | Admitting: Podiatry

## 2021-06-04 NOTE — Telephone Encounter (Signed)
Left message on voicemail for patient, stating that medication can not be refilled.

## 2021-06-04 NOTE — Telephone Encounter (Signed)
Pt returned call and I told her per Dr Allena Katz he is not going to refill her pain medication and that he had talked with her about it previously. ? ?She said he did not talk to her but ok. ?

## 2021-06-04 NOTE — Telephone Encounter (Signed)
Medication refill oxyCODONE-acetaminophen (PERCOCET) 5-325 MG tablet  Patient called requesting a refill on pain medication

## 2021-06-06 ENCOUNTER — Other Ambulatory Visit: Payer: Self-pay | Admitting: Family Medicine

## 2021-06-06 DIAGNOSIS — Z3480 Encounter for supervision of other normal pregnancy, unspecified trimester: Secondary | ICD-10-CM

## 2021-06-14 ENCOUNTER — Ambulatory Visit: Admission: RE | Admit: 2021-06-14 | Payer: Medicaid Other | Source: Ambulatory Visit

## 2021-06-14 ENCOUNTER — Ambulatory Visit: Payer: Medicaid Other | Admitting: Podiatry

## 2021-06-15 ENCOUNTER — Ambulatory Visit: Payer: Medicaid Other | Admitting: Podiatry

## 2021-07-17 ENCOUNTER — Ambulatory Visit: Admission: RE | Admit: 2021-07-17 | Payer: Medicaid Other | Source: Ambulatory Visit

## 2021-07-20 ENCOUNTER — Telehealth: Payer: Self-pay | Admitting: Podiatry

## 2021-07-20 NOTE — Telephone Encounter (Addendum)
Left vcm for pt to return call. ? ? ? ?Pt calling for MRI results. ? ? ?Please advise ?

## 2021-07-20 NOTE — Telephone Encounter (Signed)
Pt returned my call. She is wanting the results of her CT scan of right ankle that was done on 05/16/21. She had an appt to go over results on 06/15/21 but she cancelled that appt. She wants to know if she can get the resutls over the phone. ? ?Please advise. ?

## 2021-07-24 ENCOUNTER — Ambulatory Visit: Payer: Medicaid Other | Admitting: Podiatry

## 2021-07-24 NOTE — Telephone Encounter (Signed)
Pt is calling back to get her CT scan results. She had an appt scheduled for this morning but called to reschedule due to lack of transportation. Rescheduled for 5/9. ? ?Can these results be given over the phone? ? ?Please advise. ?

## 2021-07-25 ENCOUNTER — Ambulatory Visit: Admission: RE | Admit: 2021-07-25 | Payer: Medicaid Other | Source: Ambulatory Visit

## 2021-07-26 ENCOUNTER — Emergency Department
Admission: EM | Admit: 2021-07-26 | Discharge: 2021-07-26 | Disposition: A | Discharge status: Home / Self Care | Payer: Medicaid Other | Attending: Emergency Medicine | Admitting: Emergency Medicine

## 2021-07-26 ENCOUNTER — Emergency Department: Payer: Medicaid Other

## 2021-07-26 ENCOUNTER — Other Ambulatory Visit: Payer: Self-pay

## 2021-07-26 DIAGNOSIS — O0289 Other abnormal products of conception: Secondary | ICD-10-CM | POA: Diagnosis not present

## 2021-07-26 DIAGNOSIS — O26892 Other specified pregnancy related conditions, second trimester: Secondary | ICD-10-CM | POA: Insufficient documentation

## 2021-07-26 DIAGNOSIS — R0789 Other chest pain: Secondary | ICD-10-CM | POA: Diagnosis not present

## 2021-07-26 DIAGNOSIS — F419 Anxiety disorder, unspecified: Secondary | ICD-10-CM | POA: Insufficient documentation

## 2021-07-26 DIAGNOSIS — Z3A25 25 weeks gestation of pregnancy: Secondary | ICD-10-CM | POA: Diagnosis not present

## 2021-07-26 DIAGNOSIS — N3090 Cystitis, unspecified without hematuria: Secondary | ICD-10-CM

## 2021-07-26 DIAGNOSIS — Z3492 Encounter for supervision of normal pregnancy, unspecified, second trimester: Secondary | ICD-10-CM

## 2021-07-26 LAB — BASIC METABOLIC PANEL
Anion gap: 7 (ref 5–15)
BUN: 9 mg/dL (ref 6–20)
CO2: 22 mmol/L (ref 22–32)
Calcium: 8.9 mg/dL (ref 8.9–10.3)
Chloride: 104 mmol/L (ref 98–111)
Creatinine, Ser: 0.68 mg/dL (ref 0.44–1.00)
GFR, Estimated: >60 mL/min (ref >60)
Glucose, Bld: 88 mg/dL (ref 70–99)
Potassium: 3.6 mmol/L (ref 3.5–5.1)
Sodium: 133 mmol/L — ABNORMAL LOW (ref 135–145)

## 2021-07-26 LAB — URINALYSIS, ROUTINE W REFLEX MICROSCOPIC
Bilirubin Urine: NEGATIVE
Glucose, UA: NEGATIVE mg/dL
Hgb urine dipstick: NEGATIVE
Ketones, ur: 20 mg/dL — AB
Nitrite: NEGATIVE
Protein, ur: NEGATIVE mg/dL
Specific Gravity, Urine: 1.013 (ref 1.005–1.030)
pH: 5 (ref 5.0–8.0)

## 2021-07-26 LAB — CBC
HCT: 28.8 % — ABNORMAL LOW (ref 36.0–46.0)
Hemoglobin: 9 g/dL — ABNORMAL LOW (ref 12.0–15.0)
MCH: 23.7 pg — ABNORMAL LOW (ref 26.0–34.0)
MCHC: 31.3 g/dL (ref 30.0–36.0)
MCV: 75.8 fL — ABNORMAL LOW (ref 80.0–100.0)
Platelets: 456 10*3/uL — ABNORMAL HIGH (ref 150–400)
RBC: 3.8 MIL/uL — ABNORMAL LOW (ref 3.87–5.11)
RDW: 18.2 % — ABNORMAL HIGH (ref 11.5–15.5)
WBC: 8.5 10*3/uL (ref 4.0–10.5)
nRBC: 0 % (ref 0.0–0.2)

## 2021-07-26 LAB — HCG, QUANTITATIVE, PREGNANCY: hCG, Beta Chain, Quant, S: 38872 m[IU]/mL — ABNORMAL HIGH (ref <5)

## 2021-07-26 LAB — TROPONIN I (HIGH SENSITIVITY)
Troponin I (High Sensitivity): 4 ng/L (ref <18)
Troponin I (High Sensitivity): 4 ng/L (ref <18)

## 2021-07-26 MED ORDER — CEPHALEXIN 500 MG PO CAPS: 500.0000 mg | ORAL_CAPSULE | Freq: Three times a day (TID) | ORAL | 0 refills | Status: DC

## 2021-07-26 MED ORDER — ONDANSETRON 4 MG PO TBDP
4.0000 mg | ORAL_TABLET | Freq: Once | ORAL | Status: AC
  Administered 2021-07-26: 4 mg via ORAL
  Filled 2021-07-26: qty 1

## 2021-07-26 NOTE — ED Notes (Signed)
Pt. Given [place] sandwich tray and gingerale. ?

## 2021-07-26 NOTE — ED Triage Notes (Signed)
Pt comes into the ED via EMS from home with c/o chest pain, upper back pain since 6am , hx of GERD, 6-26mo pregnant, no prenatal care, denies abd or lower back pain.  ? ?126/73 ?HR100 ?100%RA ?CBG130 ?97.2 temp ?#20gLAC ?

## 2021-07-26 NOTE — ED Notes (Signed)
Pt. States she is ready for discharge, Dr. Joni Fears notified. ?

## 2021-07-26 NOTE — ED Provider Notes (Signed)
? ?[facility] ?Provider Note ? ? ? Event Date/Time  ? First MD Initiated Contact with Patient 07/26/21 1710   ?  (approximate) ? ? ?History  ? ?Chest Pain ? ? ?HPI ? ?Araly Kaas is a 31 y.o. female with past history of anxiety and previous UTI who comes ED complaining of anxiety attacks, lasting a few minutes at a time, associated with reproducible chest wall pain anteriorly, denies shortness of breath or pleuritic pain.  No exertional symptoms.  No vaginal bleeding discharge or leakage of fluid.  No abdominal pain or contractions.  She is feeling better now and symptoms are resolved.  No dysuria. ?  ? ? ?Physical Exam  ? ?Triage Vital Signs: ?ED Triage Vitals [07/26/21 1129]  ?Enc Vitals Group  ?   BP 127/79  ?   Pulse Rate 99  ?   Resp 20  ?   Temp 97.9 ?F (36.6 ?C)  ?   Temp Source Oral  ?   SpO2 100 %  ?   Weight   ?   Height   ?   Head Circumference   ?   Peak Flow   ?   Pain Score   ?   Pain Loc   ?   Pain Edu?   ?   Excl. in GC?   ? ? ?Most recent vital signs: ?Vitals:  ? 07/26/21 1745 07/26/21 1815  ?BP:    ?Pulse: 89 (!) 101  ?Resp:    ?Temp:    ?SpO2: 100% 95%  ? ? ? ?General: Awake, no distress.  ?CV:  Good peripheral perfusion.  Regular rate and rhythm ?Resp:  Normal effort.  Clear to auscultation bilaterally ?Abd:  No distention.  Soft, nontender.  Gravid ?Other:  No lower extremity edema or calf tenderness.  Moist oral mucosa.  Patient is calm.  She demonstrates that the chest pain is reproducible with palpation over the sternum. ? ? ?ED Results / Procedures / Treatments  ? ?Labs ?(all labs ordered are listed, but only abnormal results are displayed) ?Labs Reviewed  ?BASIC METABOLIC PANEL - Abnormal; Notable for the following components:  ?    Result Value  ? Sodium 133 (*)   ? All other components within normal limits  ?CBC - Abnormal; Notable for the following components:  ? RBC 3.80 (*)   ? Hemoglobin 9.0 (*)   ? HCT 28.8 (*)   ? MCV 75.8 (*)   ? MCH 23.7 (*)   ? RDW 18.2  (*)   ? Platelets 456 (*)   ? All other components within normal limits  ?URINALYSIS, ROUTINE W REFLEX MICROSCOPIC - Abnormal; Notable for the following components:  ? Color, Urine YELLOW (*)   ? APPearance HAZY (*)   ? Ketones, ur 20 (*)   ? Leukocytes,Ua MODERATE (*)   ? Bacteria, UA RARE (*)   ? All other components within normal limits  ?HCG, QUANTITATIVE, PREGNANCY - Abnormal; Notable for the following components:  ? hCG, Beta Francene Finders 50,539 (*)   ? All other components within normal limits  ?URINE CULTURE  ?TROPONIN I (HIGH SENSITIVITY)  ?TROPONIN I (HIGH SENSITIVITY)  ? ? ? ?EKG ? ?Interpreted by me ?Normal sinus rhythm rate of 93.  Normal axis and intervals.  Normal QRS ST segments and T waves.  No acute ischemic changes.  No evidence of underlying dysrhythmia. ? ? ?RADIOLOGY ? ? ? ? ?PROCEDURES: ? ?Critical Care performed: No ? ?Procedures ? ? ?  MEDICATIONS ORDERED IN ED: ?Medications  ?ondansetron (ZOFRAN-ODT) disintegrating tablet 4 mg (4 mg Oral Given 07/26/21 1445)  ? ? ? ?IMPRESSION / MDM / ASSESSMENT AND PLAN / ED COURSE  ?I reviewed the triage vital signs and the nursing notes. ?             ?               ? ? ?Patient presents with chest wall pain, anxiety.  Presentation is highly consistent with anxiety attacks. Considering the patient's symptoms, medical history, and physical examination today, I have low suspicion for ACS, PE, TAD, pneumothorax, carditis, mediastinitis, pneumonia, CHF, or sepsis. ?Exam EKG and labs are all reassuring.  She is tolerating oral intake and stable for discharge ? ? ? ?  ? ? ?FINAL CLINICAL IMPRESSION(S) / ED DIAGNOSES  ? ?Final diagnoses:  ?Second trimester pregnancy  ?Anxiety  ?Cystitis  ? ? ? ?Rx / DC Orders  ? ?ED Discharge Orders   ? ?      Ordered  ?  cephALEXin (KEFLEX) 500 MG capsule  3 times daily       ? 07/26/21 1848  ? ?  ?  ? ?  ? ? ? ?Note:  This document was prepared using Dragon voice recognition software and may include unintentional dictation  errors. ?  ?Sharman Cheek, MD ?07/26/21 1851 ? ?

## 2021-07-26 NOTE — ED Provider Triage Note (Signed)
Emergency Medicine Provider Triage Evaluation Note ? ?Madison Singleton , a 31 y.o. female  6-7 months pregnant (patient unsure) was evaluated in triage.  Pt complains of chest pain and epigastric pain and back pain since 6am. No vaginal bleeding ? ?Review of Systems  ?Positive: Chest pain, back pain, abdominal pain ?Negative: Fever, SOB, vag bleeding ? ?Physical Exam  ?LMP  (LMP Unknown)  ?Gen:   Awake, no distress   ?Resp:  Normal effort  ?MSK:   Moves extremities without difficulty  ?Other:  FT  ? ?Medical Decision Making  ?Medically screening exam initiated at 11:25 AM.  Appropriate orders placed.  Lorrane Mccay was informed that the remainder of the evaluation will be completed by another provider, this initial triage assessment does not replace that evaluation, and the importance of remaining in the ED until their evaluation is complete. ? ? ?  ?Chinita Pester, FNP ?07/26/21 1128 ? ?

## 2021-07-26 NOTE — ED Notes (Signed)
This RN to bedside, pt. Demanding food, blankets, and refusing to keep cardiac monitoring on. This RN sat down with pt. And explained importance of assessment and listened empathetically. Pt. States she has too much stress and she has come to the ED today for stress management. This RN askes pt. If she has had SOB or CP with her stress, pt. States yes, that is how her panic attacks present. This RN discussed pt's plan after d/c from hospital, where she will find follow up care and how she can avoid reaching crisis level again. Pt. Is receptive, and states she will follow up with her therapist  from [place]. ?

## 2021-07-28 LAB — URINE CULTURE

## 2021-07-31 ENCOUNTER — Ambulatory Visit: Payer: Medicaid Other | Admitting: Podiatry

## 2021-08-06 NOTE — Telephone Encounter (Signed)
Please advise 

## 2021-08-06 NOTE — Telephone Encounter (Signed)
Patient calling again for CT results.

## 2021-08-08 NOTE — Telephone Encounter (Signed)
Patients is calling again states this is the 6th calls she has made to get test results for Ct , no one is calling her back ? Patient would like a call back asap.

## 2021-08-15 NOTE — Telephone Encounter (Signed)
Dr Allena Katz and Dr Ralene Cork have tried to reach patient to go over results, left vmessages.

## 2021-08-16 ENCOUNTER — Ambulatory Visit: Payer: Medicaid Other | Admitting: Podiatry

## 2021-08-16 NOTE — Telephone Encounter (Signed)
Patient called and stated that she would like someone to call her back with the results of her CT and she went ahead and cancelled for today.

## 2021-08-29 NOTE — Telephone Encounter (Signed)
Has the patient been called or appointment made?

## 2021-09-18 ENCOUNTER — Other Ambulatory Visit: Payer: Self-pay | Admitting: Obstetrics and Gynecology

## 2021-09-18 DIAGNOSIS — O99013 Anemia complicating pregnancy, third trimester: Secondary | ICD-10-CM

## 2021-09-21 ENCOUNTER — Ambulatory Visit
Admission: RE | Admit: 2021-09-21 | Discharge: 2021-09-21 | Disposition: A | Discharge status: Home / Self Care | Payer: Medicaid Other | Source: Ambulatory Visit | Attending: Obstetrics and Gynecology | Admitting: Obstetrics and Gynecology

## 2021-09-21 DIAGNOSIS — Z3A00 Weeks of gestation of pregnancy not specified: Secondary | ICD-10-CM | POA: Insufficient documentation

## 2021-09-21 DIAGNOSIS — O99013 Anemia complicating pregnancy, third trimester: Secondary | ICD-10-CM | POA: Diagnosis present

## 2021-09-21 MED ORDER — SODIUM CHLORIDE 0.9 % IV SOLN
300.0000 mg | INTRAVENOUS | Status: DC
  Administered 2021-09-21: 300 mg via INTRAVENOUS
  Filled 2021-09-21: qty 300

## 2021-09-27 ENCOUNTER — Encounter
Admission: RE | Admit: 2021-09-27 | Discharge: 2021-09-27 | Disposition: A | Discharge status: Home / Self Care | Payer: Medicaid Other | Source: Ambulatory Visit | Attending: Certified Nurse Midwife | Admitting: Certified Nurse Midwife

## 2021-09-27 DIAGNOSIS — D509 Iron deficiency anemia, unspecified: Secondary | ICD-10-CM | POA: Insufficient documentation

## 2021-09-27 DIAGNOSIS — Z3A38 38 weeks gestation of pregnancy: Secondary | ICD-10-CM | POA: Insufficient documentation

## 2021-09-27 DIAGNOSIS — O99013 Anemia complicating pregnancy, third trimester: Secondary | ICD-10-CM | POA: Insufficient documentation

## 2021-09-27 MED ORDER — SODIUM CHLORIDE 0.9 % IV SOLN
300.0000 mg | Freq: Once | INTRAVENOUS | Status: AC
  Administered 2021-09-27: 300 mg via INTRAVENOUS
  Filled 2021-09-27: qty 300

## 2021-10-02 DIAGNOSIS — O320XX0 Maternal care for unstable lie, not applicable or unspecified: Secondary | ICD-10-CM | POA: Insufficient documentation

## 2021-10-03 ENCOUNTER — Ambulatory Visit
Admission: RE | Admit: 2021-10-03 | Discharge: 2021-10-03 | Disposition: A | Discharge status: Home / Self Care | Payer: Medicaid Other | Source: Ambulatory Visit | Attending: Certified Nurse Midwife | Admitting: Certified Nurse Midwife

## 2021-10-03 NOTE — Progress Notes (Signed)
Patient did not show up for Iron Infusion appointment scheduled for 9:00 today. Called and left message to her voicemail.

## 2021-10-04 ENCOUNTER — Other Ambulatory Visit: Payer: Self-pay

## 2021-10-04 ENCOUNTER — Observation Stay
Admission: EM | Admit: 2021-10-04 | Discharge: 2021-10-05 | Disposition: A | Discharge status: Home / Self Care | Payer: Medicaid Other | Attending: Obstetrics and Gynecology | Admitting: Obstetrics and Gynecology

## 2021-10-04 DIAGNOSIS — F17210 Nicotine dependence, cigarettes, uncomplicated: Secondary | ICD-10-CM | POA: Diagnosis not present

## 2021-10-04 DIAGNOSIS — O99013 Anemia complicating pregnancy, third trimester: Secondary | ICD-10-CM | POA: Diagnosis not present

## 2021-10-04 DIAGNOSIS — O99283 Endocrine, nutritional and metabolic diseases complicating pregnancy, third trimester: Secondary | ICD-10-CM | POA: Diagnosis not present

## 2021-10-04 DIAGNOSIS — O99333 Smoking (tobacco) complicating pregnancy, third trimester: Secondary | ICD-10-CM | POA: Insufficient documentation

## 2021-10-04 DIAGNOSIS — E860 Dehydration: Secondary | ICD-10-CM | POA: Diagnosis not present

## 2021-10-04 DIAGNOSIS — O0933 Supervision of pregnancy with insufficient antenatal care, third trimester: Secondary | ICD-10-CM | POA: Diagnosis not present

## 2021-10-04 DIAGNOSIS — O4703 False labor before 37 completed weeks of gestation, third trimester: Principal | ICD-10-CM | POA: Diagnosis present

## 2021-10-04 DIAGNOSIS — O09213 Supervision of pregnancy with history of pre-term labor, third trimester: Secondary | ICD-10-CM | POA: Diagnosis not present

## 2021-10-04 DIAGNOSIS — Z79899 Other long term (current) drug therapy: Secondary | ICD-10-CM | POA: Diagnosis not present

## 2021-10-04 DIAGNOSIS — F1410 Cocaine abuse, uncomplicated: Secondary | ICD-10-CM | POA: Insufficient documentation

## 2021-10-04 DIAGNOSIS — O99323 Drug use complicating pregnancy, third trimester: Secondary | ICD-10-CM | POA: Diagnosis not present

## 2021-10-04 DIAGNOSIS — D509 Iron deficiency anemia, unspecified: Secondary | ICD-10-CM | POA: Diagnosis not present

## 2021-10-04 DIAGNOSIS — Z3A35 35 weeks gestation of pregnancy: Secondary | ICD-10-CM | POA: Diagnosis not present

## 2021-10-04 LAB — CBC
HCT: 29.1 % — ABNORMAL LOW (ref 36.0–46.0)
Hemoglobin: 8.8 g/dL — ABNORMAL LOW (ref 12.0–15.0)
MCH: 22.3 pg — ABNORMAL LOW (ref 26.0–34.0)
MCHC: 30.2 g/dL (ref 30.0–36.0)
MCV: 73.7 fL — ABNORMAL LOW (ref 80.0–100.0)
Platelets: 409 10*3/uL — ABNORMAL HIGH (ref 150–400)
RBC: 3.95 MIL/uL (ref 3.87–5.11)
RDW: 23.1 % — ABNORMAL HIGH (ref 11.5–15.5)
WBC: 9.7 10*3/uL (ref 4.0–10.5)
nRBC: 0 % (ref 0.0–0.2)

## 2021-10-04 LAB — WET PREP, GENITAL
Clue Cells Wet Prep HPF POC: NONE SEEN
Sperm: NONE SEEN
Trich, Wet Prep: NONE SEEN
WBC, Wet Prep HPF POC: >=10 — AB (ref <10)
Yeast Wet Prep HPF POC: NONE SEEN

## 2021-10-04 LAB — URINALYSIS, COMPLETE (UACMP) WITH MICROSCOPIC
Bilirubin Urine: NEGATIVE
Glucose, UA: NEGATIVE mg/dL
Hgb urine dipstick: NEGATIVE
Ketones, ur: 80 mg/dL — AB
Nitrite: NEGATIVE
Protein, ur: 30 mg/dL — AB
Specific Gravity, Urine: 1.016 (ref 1.005–1.030)
pH: 5 (ref 5.0–8.0)

## 2021-10-04 LAB — URINE DRUG SCREEN, QUALITATIVE (ARMC ONLY)
Amphetamines, Ur Screen: NOT DETECTED
Barbiturates, Ur Screen: NOT DETECTED
Benzodiazepine, Ur Scrn: NOT DETECTED
Cannabinoid 50 Ng, Ur ~~LOC~~: NOT DETECTED
Cocaine Metabolite,Ur ~~LOC~~: POSITIVE — AB
MDMA (Ecstasy)Ur Screen: NOT DETECTED
Methadone Scn, Ur: NOT DETECTED
Opiate, Ur Screen: NOT DETECTED
Phencyclidine (PCP) Ur S: NOT DETECTED
Tricyclic, Ur Screen: NOT DETECTED

## 2021-10-04 MED ORDER — LACTATED RINGERS IV SOLN: INTRAVENOUS | Status: DC

## 2021-10-04 MED ORDER — SOD CITRATE-CITRIC ACID 500-334 MG/5ML PO SOLN
ORAL | Status: AC
  Filled 2021-10-04: qty 15

## 2021-10-04 MED ORDER — SOD CITRATE-CITRIC ACID 500-334 MG/5ML PO SOLN
30.0000 mL | Freq: Once | ORAL | Status: AC
  Administered 2021-10-04: 30 mL via ORAL

## 2021-10-04 NOTE — OB Triage Provider Note (Signed)
Madison Singleton is a 31 y.o. female. She is at [redacted]w[redacted]d gestation. No LMP recorded (lmp unknown). Patient is pregnant. Estimated Date of Delivery: 11/05/21  Prenatal care site: [facility]   Current pregnancy complicated by:  Tobacco use Substance abuse: cocaine, alcohol, reported not current use for either at prenatal visit.  Iron deficiency anemia Mental health dx: bipolar, PTSD, Anxiety, Insomnia  Hx preterm births: pt states 5 of her babies born at 63wks and "induced" with membrane sweep Late PNC, initial visit at 25wks Chlamydia positive 6/20, pt states she vomited treatment.   Chief complaint: Arrived via EMS, c/o ctx every 10-78min and dehydration today. Pt state she has had more than a couple drinks of alcohol and did cocaine, both yesterday.    S: Resting comfortably. no CTX, no VB.no LOF,  Active fetal movement. Denies: HA, visual changes, SOB, or RUQ/epigastric pain  Maternal Medical History:   Past Medical History:  Diagnosis Date   Anxiety    NO MEDS   Arthritis of ankle    Breast mass    Breast tenderness in female    3;00 x3 weeks   Chronic pain syndrome    Depression    NO MEDS   GERD (gastroesophageal reflux disease)    OCC-NO MEDS   Headache    HAS RECENTLY STARTED HAVING HA'S MORE FREQUENTLY   Obesity    OSA (obstructive sleep apnea)     Past Surgical History:  Procedure Laterality Date   KNEE ARTHROSCOPY WITH MEDIAL MENISECTOMY Right 09/07/2015   Procedure: Arthroscopic Lateral Release ;  Surgeon: Kennedy Bucker, MD;  Location: [facility] ORS;  Service: Orthopedics;  Laterality: Right;   MOUTH SURGERY  12/2018   ORIF ANKLE FRACTURE Right 02/16/2019   Procedure: OPEN REDUCTION INTERNAL FIXATION (ORIF) ANKLE FRACTURE;  Surgeon: Lyndle Herrlich, MD;  Location: [facility] ORS;  Service: Orthopedics;  Laterality: Right;    Allergies  Allergen Reactions   Haldol [Haloperidol Lactate]     Throat swelling   Amoxicillin Rash    Did it involve swelling of the  face/tongue/throat, SOB, or low BP? Unknown Did it involve sudden or severe rash/hives, skin peeling, or any reaction on the inside of your mouth or nose? Yes Did you need to seek medical attention at a hospital or doctor's office? Yes When did it last happen? Childhood reaction. If all above answers are "NO", may proceed with cephalosporin use.     Prior to Admission medications   Medication Sig Start Date End Date Taking? Authorizing Provider  pantoprazole (PROTONIX) 40 MG tablet Take 40 mg by mouth daily.   Yes [provider]  Prenatal Vit-Fe Fumarate-FA (PRENATAL PO) Take 1 tablet by mouth daily.   Yes [provider]      Social History: She  reports that she has been smoking cigarettes. She has a 1.40 pack-year smoking history. She has never used smokeless tobacco. She reports current alcohol use of about 1.0 standard drink of alcohol per week. She reports that she does not currently use drugs after having used the following drugs: Cocaine.  Family History: family history includes Breast cancer in her cousin.   Review of Systems: A full review of systems was performed and negative except as noted in the HPI.     O:  BP 105/83 (BP Location: Right Arm)   Pulse (!) 112   Temp 98.6 F (37 C) (Oral)   Resp 17   LMP  (LMP Unknown)  No results found for this  or any previous visit (from the past 48 hour(s)).   Constitutional: NAD, AAOx3;  HE/ENT: extraocular movements grossly intact, moist mucous membranes CV: RRR PULM: nl respiratory effort, CTABL     Abd: gravid, non-tender, non-distended, soft      Ext: Non-tender, Nonedematous   Psych: mood appropriate, speech normal Pelvic: SSE done: copious amount cloudy malodorous creamy discharge; no LOF, no VB SVE: 3/50/-3, unable to feel presenting part.   Fetal  monitoring: Cat II Appropriate for GA Baseline: 150bpm Variability: moderate Accelerations: present  Decelerations: intermittent variables  TOCO:  irreg UCs, significant artifact tracing due to patient moving in bed.    A/P: 31 y.o. G8P7 at [redacted]w[redacted]d here for antenatal surveillance   Principle Diagnosis:   preterm contractions, 35wks Substance abuse   Monitor for cervical change IV fluids ordered, NPO due to Cat II tracing.  Initial tracing with variable decels, will continuously monitor for fetal well being.  Swabs for G/CT, wet prep and GBS pending Iron deficiency anemia- missed iron infusion appt. CBC pending Urine drug screen pending, TOC consult placed for followup.    Randa Ngo, CNM 10/04/2021  10:47 PM

## 2021-10-04 NOTE — OB Triage Note (Signed)
Pt is a 31y/o G8P7 at [redacted]w[redacted]d with c/o ctx every 10-15 and dehydration today. Pt state she has had more than a couple drinks of alcohol and did cocaine, both yesterday. Pt states +FM. Pt denies LOF and VB. Monitors applied and assessing. Initial FHT 155.

## 2021-10-05 DIAGNOSIS — O4703 False labor before 37 completed weeks of gestation, third trimester: Secondary | ICD-10-CM | POA: Diagnosis not present

## 2021-10-05 LAB — TYPE AND SCREEN
ABO/RH(D): AB POS
Antibody Screen: NEGATIVE

## 2021-10-05 LAB — CHLAMYDIA/NGC RT PCR (ARMC ONLY)
Chlamydia Tr: NOT DETECTED
N gonorrhoeae: NOT DETECTED

## 2021-10-05 LAB — GROUP B STREP BY PCR: Group B strep by PCR: NEGATIVE

## 2021-10-05 NOTE — Discharge Summary (Signed)
Madison Singleton is a 31 y.o. female. She is at [redacted]w[redacted]d gestation. No LMP recorded (lmp unknown). Patient is pregnant. Estimated Date of Delivery: 11/05/21  Prenatal care site: [facility]   Current pregnancy complicated by:  Tobacco use Substance abuse: cocaine, alcohol, reported not current use for either at prenatal visit.  Iron deficiency anemia Mental health dx: bipolar, PTSD, Anxiety, Insomnia  Hx preterm births: pt states 5 of her babies born at 89wks and "induced" with membrane sweep Late PNC, initial visit at 25wks Chlamydia positive 6/20, pt states she vomited treatment.   Chief complaint: Arrived via EMS, c/o ctx every 10-74min and dehydration today. Pt state she has had more than a couple drinks of alcohol and did cocaine, both yesterday.    S: Resting comfortably. no CTX, no VB.no LOF,  Active fetal movement. Denies: HA, visual changes, SOB, or RUQ/epigastric pain - demanded to eat, talking through UCs   Maternal Medical History:   Past Medical History:  Diagnosis Date   Anxiety    NO MEDS   Arthritis of ankle    Breast mass    Breast tenderness in female    3;00 x3 weeks   Chronic pain syndrome    Depression    NO MEDS   GERD (gastroesophageal reflux disease)    OCC-NO MEDS   Headache    HAS RECENTLY STARTED HAVING HA'S MORE FREQUENTLY   Obesity    OSA (obstructive sleep apnea)     Past Surgical History:  Procedure Laterality Date   KNEE ARTHROSCOPY WITH MEDIAL MENISECTOMY Right 09/07/2015   Procedure: Arthroscopic Lateral Release ;  Surgeon: Kennedy Bucker, MD;  Location: [facility] ORS;  Service: Orthopedics;  Laterality: Right;   MOUTH SURGERY  12/2018   ORIF ANKLE FRACTURE Right 02/16/2019   Procedure: OPEN REDUCTION INTERNAL FIXATION (ORIF) ANKLE FRACTURE;  Surgeon: Lyndle Herrlich, MD;  Location: [facility] ORS;  Service: Orthopedics;  Laterality: Right;    Allergies  Allergen Reactions   Haldol [Haloperidol Lactate]     Throat swelling   Amoxicillin  Rash    Did it involve swelling of the face/tongue/throat, SOB, or low BP? Unknown Did it involve sudden or severe rash/hives, skin peeling, or any reaction on the inside of your mouth or nose? Yes Did you need to seek medical attention at a hospital or doctor's office? Yes When did it last happen? Childhood reaction. If all above answers are "NO", may proceed with cephalosporin use.     Prior to Admission medications   Medication Sig Start Date End Date Taking? Authorizing Provider  pantoprazole (PROTONIX) 40 MG tablet Take 40 mg by mouth daily.   Yes [provider]  Prenatal Vit-Fe Fumarate-FA (PRENATAL PO) Take 1 tablet by mouth daily.   Yes [provider]      Social History: She  reports that she has been smoking cigarettes. She has a 1.40 pack-year smoking history. She has never used smokeless tobacco. She reports current alcohol use of about 1.0 standard drink of alcohol per week. She reports that she does not currently use drugs after having used the following drugs: Cocaine.  Family History: family history includes Breast cancer in her cousin.   Review of Systems: A full review of systems was performed and negative except as noted in the HPI.     O:  BP 105/83 (BP Location: Right Arm)   Pulse (!) 112   Temp 98.6 F (37 C) (Oral)   Resp 17   LMP  (  LMP Unknown)  Results for orders placed or performed during the hospital encounter of 10/04/21 (from the past 48 hour(s))  Chlamydia/NGC rt PCR ([facility] only)   Collection Time: 10/04/21 10:42 PM   Specimen: Urine, Clean Catch  Result Value Ref Range   Specimen source GC/Chlam ENDOCERVICAL    Chlamydia Tr NOT DETECTED NOT DETECTED   N gonorrhoeae NOT DETECTED NOT DETECTED  Wet prep, genital   Collection Time: 10/04/21 10:42 PM   Specimen: Urine, Clean Catch  Result Value Ref Range   Yeast Wet Prep HPF POC NONE SEEN NONE SEEN   Trich, Wet Prep NONE SEEN NONE SEEN   Clue Cells Wet Prep HPF POC NONE SEEN  NONE SEEN   WBC, Wet Prep HPF POC >=10 (A) <10   Sperm NONE SEEN   Group B strep by PCR   Collection Time: 10/04/21 10:42 PM   Specimen: Urine, Clean Catch; Genital  Result Value Ref Range   Group B strep by PCR NEGATIVE NEGATIVE  Urine Drug Screen, Qualitative ([facility] only)   Collection Time: 10/04/21 10:42 PM  Result Value Ref Range   Tricyclic, Ur Screen NONE DETECTED NONE DETECTED   Amphetamines, Ur Screen NONE DETECTED NONE DETECTED   MDMA (Ecstasy)Ur Screen NONE DETECTED NONE DETECTED   Cocaine Metabolite,Ur [place] POSITIVE (A) NONE DETECTED   Opiate, Ur Screen NONE DETECTED NONE DETECTED   Phencyclidine (PCP) Ur S NONE DETECTED NONE DETECTED   Cannabinoid 50 Ng, Ur [place] NONE DETECTED NONE DETECTED   Barbiturates, Ur Screen NONE DETECTED NONE DETECTED   Benzodiazepine, Ur Scrn NONE DETECTED NONE DETECTED   Methadone Scn, Ur NONE DETECTED NONE DETECTED  Urinalysis, Complete w Microscopic Urine, Clean Catch   Collection Time: 10/04/21 10:42 PM  Result Value Ref Range   Color, Urine YELLOW (A) YELLOW   APPearance HAZY (A) CLEAR   Specific Gravity, Urine 1.016 1.005 - 1.030   pH 5.0 5.0 - 8.0   Glucose, UA NEGATIVE NEGATIVE mg/dL   Hgb urine dipstick NEGATIVE NEGATIVE   Bilirubin Urine NEGATIVE NEGATIVE   Ketones, ur 80 (A) NEGATIVE mg/dL   Protein, ur 30 (A) NEGATIVE mg/dL   Nitrite NEGATIVE NEGATIVE   Leukocytes,Ua SMALL (A) NEGATIVE   RBC / HPF 0-5 0 - 5 RBC/hpf   WBC, UA 11-20 0 - 5 WBC/hpf   Bacteria, UA RARE (A) NONE SEEN   Squamous Epithelial / LPF 0-5 0 - 5   Mucus PRESENT    Amorphous Crystal PRESENT   CBC   Collection Time: 10/04/21 10:42 PM  Result Value Ref Range   WBC 9.7 4.0 - 10.5 K/uL   RBC 3.95 3.87 - 5.11 MIL/uL   Hemoglobin 8.8 (L) 12.0 - 15.0 g/dL   HCT 86.5 (L) 78.4 - 69.6 %   MCV 73.7 (L) 80.0 - 100.0 fL   MCH 22.3 (L) 26.0 - 34.0 pg   MCHC 30.2 30.0 - 36.0 g/dL   RDW 29.5 (H) 28.4 - 13.2 %   Platelets 409 (H) 150 - 400 K/uL   nRBC 0.0 0.0 - 0.2 %   Type and screen [facility]   Collection Time: 10/04/21 10:42 PM  Result Value Ref Range   ABO/RH(D) AB POS    Antibody Screen NEG    Sample Expiration      10/07/2021,2359 Performed at [facility], [address] [place], [place] [address]      Constitutional: NAD, AAOx3;  HE/ENT: extraocular movements grossly intact, moist mucous membranes  CV: RRR PULM: nl respiratory effort, CTABL     Abd: gravid, non-tender, non-distended, soft      Ext: Non-tender, Nonedematous   Psych: mood appropriate, speech normal Pelvic: SSE done: copious amount cloudy malodorous creamy discharge; no LOF, no VB SVE: 3/50/-3, unable to feel presenting part.  - repeat exam per nursing- unchanged.   Fetal  monitoring: Cat II Appropriate for GA Baseline: 150bpm Variability: moderate Accelerations: present  Decelerations: none  TOCO: irreg UCs and UI   A/P: 31 y.o. G8P7 at [redacted]w[redacted]d here for antenatal surveillance   Principle Diagnosis:   preterm contractions, 35wks Substance abuse   No active labor or cervical change. IV fluids for hydration Initial tracing with variable decels, will continuously monitor for fetal well being. Prolonged monitoring overall reassuring.  Neg swabs for G/CT, wet prep and GBS  Iron deficiency anemia- missed iron infusion appt. CBC pendingc/w anemia, encouraged pt to reschedule iron infusion appt.  Urine drug screen pos for cocaine, TOC consult placed for followup. Discussed risks of cocaine use including abruption, PTB, death.    Randa Ngo, CNM 10/05/2021  7:33 AM   Duplicate note due to mislabeled dc summary already completed.

## 2021-10-05 NOTE — Progress Notes (Signed)
Patient stable for discharge. Patient ambulated escorted by RN to ED waiting area. ED RN and [facility] aware.

## 2021-10-05 NOTE — Progress Notes (Signed)
Pt met requirements for discharge. IV has been removed and discontinued. Discharged instructions have been given and reviewed. Pt verbalized understanding. Pt states she will work on finding a ride.

## 2021-10-05 NOTE — OB Triage Provider Note (Signed)
Madison Singleton is a 31 y.o. female. She is at [redacted]w[redacted]d gestation. No LMP recorded (lmp unknown). Patient is pregnant. Estimated Date of Delivery: 11/05/21  Prenatal care site: [facility]   Current pregnancy complicated by:  Tobacco use Substance abuse: cocaine, alcohol, reported not current use for either at prenatal visit.  Iron deficiency anemia Mental health dx: bipolar, PTSD, Anxiety, Insomnia  Hx preterm births: pt states 5 of her babies born at 44wks and "induced" with membrane sweep Late PNC, initial visit at 25wks Chlamydia positive 6/20, pt states she vomited treatment.   Chief complaint: Arrived via EMS, c/o ctx every 10-58min and dehydration today. Pt state she has had more than a couple drinks of alcohol and did cocaine, both yesterday.    S: Resting comfortably. no CTX, no VB.no LOF,  Active fetal movement. Denies: HA, visual changes, SOB, or RUQ/epigastric pain - demanded to eat, talking through UCs   Maternal Medical History:   Past Medical History:  Diagnosis Date   Anxiety    NO MEDS   Arthritis of ankle    Breast mass    Breast tenderness in female    3;00 x3 weeks   Chronic pain syndrome    Depression    NO MEDS   GERD (gastroesophageal reflux disease)    OCC-NO MEDS   Headache    HAS RECENTLY STARTED HAVING HA'S MORE FREQUENTLY   Obesity    OSA (obstructive sleep apnea)     Past Surgical History:  Procedure Laterality Date   KNEE ARTHROSCOPY WITH MEDIAL MENISECTOMY Right 09/07/2015   Procedure: Arthroscopic Lateral Release ;  Surgeon: Kennedy Bucker, MD;  Location: [facility] ORS;  Service: Orthopedics;  Laterality: Right;   MOUTH SURGERY  12/2018   ORIF ANKLE FRACTURE Right 02/16/2019   Procedure: OPEN REDUCTION INTERNAL FIXATION (ORIF) ANKLE FRACTURE;  Surgeon: Lyndle Herrlich, MD;  Location: [facility] ORS;  Service: Orthopedics;  Laterality: Right;    Allergies  Allergen Reactions   Haldol [Haloperidol Lactate]     Throat swelling   Amoxicillin  Rash    Did it involve swelling of the face/tongue/throat, SOB, or low BP? Unknown Did it involve sudden or severe rash/hives, skin peeling, or any reaction on the inside of your mouth or nose? Yes Did you need to seek medical attention at a hospital or doctor's office? Yes When did it last happen? Childhood reaction. If all above answers are "NO", may proceed with cephalosporin use.     Prior to Admission medications   Medication Sig Start Date End Date Taking? Authorizing Provider  pantoprazole (PROTONIX) 40 MG tablet Take 40 mg by mouth daily.   Yes [provider]  Prenatal Vit-Fe Fumarate-FA (PRENATAL PO) Take 1 tablet by mouth daily.   Yes [provider]      Social History: She  reports that she has been smoking cigarettes. She has a 1.40 pack-year smoking history. She has never used smokeless tobacco. She reports current alcohol use of about 1.0 standard drink of alcohol per week. She reports that she does not currently use drugs after having used the following drugs: Cocaine.  Family History: family history includes Breast cancer in her cousin.   Review of Systems: A full review of systems was performed and negative except as noted in the HPI.     O:  BP 105/83 (BP Location: Right Arm)   Pulse (!) 112   Temp 98.6 F (37 C) (Oral)   Resp 17   LMP  (  LMP Unknown)  Results for orders placed or performed during the hospital encounter of 10/04/21 (from the past 48 hour(s))  Chlamydia/NGC rt PCR ([facility] only)   Collection Time: 10/04/21 10:42 PM   Specimen: Urine, Clean Catch  Result Value Ref Range   Specimen source GC/Chlam ENDOCERVICAL    Chlamydia Tr NOT DETECTED NOT DETECTED   N gonorrhoeae NOT DETECTED NOT DETECTED  Wet prep, genital   Collection Time: 10/04/21 10:42 PM   Specimen: Urine, Clean Catch  Result Value Ref Range   Yeast Wet Prep HPF POC NONE SEEN NONE SEEN   Trich, Wet Prep NONE SEEN NONE SEEN   Clue Cells Wet Prep HPF POC NONE SEEN  NONE SEEN   WBC, Wet Prep HPF POC >=10 (A) <10   Sperm NONE SEEN   Group B strep by PCR   Collection Time: 10/04/21 10:42 PM   Specimen: Urine, Clean Catch; Genital  Result Value Ref Range   Group B strep by PCR NEGATIVE NEGATIVE  Urine Drug Screen, Qualitative ([facility] only)   Collection Time: 10/04/21 10:42 PM  Result Value Ref Range   Tricyclic, Ur Screen NONE DETECTED NONE DETECTED   Amphetamines, Ur Screen NONE DETECTED NONE DETECTED   MDMA (Ecstasy)Ur Screen NONE DETECTED NONE DETECTED   Cocaine Metabolite,Ur [place] POSITIVE (A) NONE DETECTED   Opiate, Ur Screen NONE DETECTED NONE DETECTED   Phencyclidine (PCP) Ur S NONE DETECTED NONE DETECTED   Cannabinoid 50 Ng, Ur [place] NONE DETECTED NONE DETECTED   Barbiturates, Ur Screen NONE DETECTED NONE DETECTED   Benzodiazepine, Ur Scrn NONE DETECTED NONE DETECTED   Methadone Scn, Ur NONE DETECTED NONE DETECTED  Urinalysis, Complete w Microscopic Urine, Clean Catch   Collection Time: 10/04/21 10:42 PM  Result Value Ref Range   Color, Urine YELLOW (A) YELLOW   APPearance HAZY (A) CLEAR   Specific Gravity, Urine 1.016 1.005 - 1.030   pH 5.0 5.0 - 8.0   Glucose, UA NEGATIVE NEGATIVE mg/dL   Hgb urine dipstick NEGATIVE NEGATIVE   Bilirubin Urine NEGATIVE NEGATIVE   Ketones, ur 80 (A) NEGATIVE mg/dL   Protein, ur 30 (A) NEGATIVE mg/dL   Nitrite NEGATIVE NEGATIVE   Leukocytes,Ua SMALL (A) NEGATIVE   RBC / HPF 0-5 0 - 5 RBC/hpf   WBC, UA 11-20 0 - 5 WBC/hpf   Bacteria, UA RARE (A) NONE SEEN   Squamous Epithelial / LPF 0-5 0 - 5   Mucus PRESENT    Amorphous Crystal PRESENT   CBC   Collection Time: 10/04/21 10:42 PM  Result Value Ref Range   WBC 9.7 4.0 - 10.5 K/uL   RBC 3.95 3.87 - 5.11 MIL/uL   Hemoglobin 8.8 (L) 12.0 - 15.0 g/dL   HCT 86.5 (L) 78.4 - 69.6 %   MCV 73.7 (L) 80.0 - 100.0 fL   MCH 22.3 (L) 26.0 - 34.0 pg   MCHC 30.2 30.0 - 36.0 g/dL   RDW 29.5 (H) 28.4 - 13.2 %   Platelets 409 (H) 150 - 400 K/uL   nRBC 0.0 0.0 - 0.2 %   Type and screen [facility]   Collection Time: 10/04/21 10:42 PM  Result Value Ref Range   ABO/RH(D) AB POS    Antibody Screen NEG    Sample Expiration      10/07/2021,2359 Performed at [facility], [address] [place], [place] [address]      Constitutional: NAD, AAOx3;  HE/ENT: extraocular movements grossly intact, moist mucous membranes  CV: RRR PULM: nl respiratory effort, CTABL     Abd: gravid, non-tender, non-distended, soft      Ext: Non-tender, Nonedematous   Psych: mood appropriate, speech normal Pelvic: SSE done: copious amount cloudy malodorous creamy discharge; no LOF, no VB SVE: 3/50/-3, unable to feel presenting part.  - repeat exam per nursing- unchanged.   Fetal  monitoring: Cat II Appropriate for GA Baseline: 150bpm Variability: moderate Accelerations: present  Decelerations: none  TOCO: irreg UCs and UI   A/P: 31 y.o. G8P7 at [redacted]w[redacted]d here for antenatal surveillance   Principle Diagnosis:   preterm contractions, 35wks Substance abuse   No active labor or cervical change. IV fluids for hydration Initial tracing with variable decels, will continuously monitor for fetal well being. Prolonged monitoring overall reassuring.  Neg swabs for G/CT, wet prep and GBS  Iron deficiency anemia- missed iron infusion appt. CBC pendingc/w anemia, encouraged pt to reschedule iron infusion appt.  Urine drug screen pos for cocaine, TOC consult placed for followup. Discussed risks of cocaine use including abruption, PTB, death.    Randa Ngo, CNM 10/05/2021  4:58 AM

## 2021-10-05 NOTE — Progress Notes (Signed)
Patient stable for discharge home, but unable to find ride. I discussed plan with patient to allow her a little longer to find a ride, but then would have to escort her to the ED waiting area until her ride was available. Patient verbalized understanding. Plan to check in with patient at 0430. AC notified.

## 2021-10-08 DIAGNOSIS — B951 Streptococcus, group B, as the cause of diseases classified elsewhere: Secondary | ICD-10-CM | POA: Insufficient documentation

## 2021-10-12 ENCOUNTER — Ambulatory Visit
Admission: RE | Admit: 2021-10-12 | Discharge: 2021-10-12 | Disposition: A | Discharge status: Home / Self Care | Payer: Medicaid Other | Source: Ambulatory Visit | Attending: Certified Nurse Midwife | Admitting: Certified Nurse Midwife

## 2021-10-12 DIAGNOSIS — O99013 Anemia complicating pregnancy, third trimester: Secondary | ICD-10-CM | POA: Insufficient documentation

## 2021-10-12 MED ORDER — SODIUM CHLORIDE 0.9 % IV SOLN
300.0000 mg | Freq: Once | INTRAVENOUS | Status: AC
  Administered 2021-10-12: 300 mg via INTRAVENOUS
  Filled 2021-10-12: qty 300

## 2021-10-19 ENCOUNTER — Encounter: Payer: Self-pay | Admitting: Obstetrics and Gynecology

## 2021-10-19 ENCOUNTER — Ambulatory Visit: Admission: RE | Admit: 2021-10-19 | Payer: Medicaid Other | Source: Ambulatory Visit

## 2021-10-19 ENCOUNTER — Observation Stay
Admission: EM | Admit: 2021-10-19 | Discharge: 2021-10-19 | Disposition: A | Discharge status: Home / Self Care | Payer: Medicaid Other | Attending: Obstetrics and Gynecology | Admitting: Obstetrics and Gynecology

## 2021-10-19 DIAGNOSIS — O99323 Drug use complicating pregnancy, third trimester: Secondary | ICD-10-CM | POA: Diagnosis not present

## 2021-10-19 DIAGNOSIS — O471 False labor at or after 37 completed weeks of gestation: Secondary | ICD-10-CM | POA: Diagnosis present

## 2021-10-19 DIAGNOSIS — D509 Iron deficiency anemia, unspecified: Secondary | ICD-10-CM | POA: Diagnosis not present

## 2021-10-19 DIAGNOSIS — O99013 Anemia complicating pregnancy, third trimester: Secondary | ICD-10-CM | POA: Diagnosis not present

## 2021-10-19 DIAGNOSIS — F1090 Alcohol use, unspecified, uncomplicated: Secondary | ICD-10-CM | POA: Insufficient documentation

## 2021-10-19 DIAGNOSIS — F17200 Nicotine dependence, unspecified, uncomplicated: Secondary | ICD-10-CM | POA: Insufficient documentation

## 2021-10-19 DIAGNOSIS — O479 False labor, unspecified: Secondary | ICD-10-CM | POA: Diagnosis present

## 2021-10-19 DIAGNOSIS — F1410 Cocaine abuse, uncomplicated: Secondary | ICD-10-CM | POA: Diagnosis not present

## 2021-10-19 DIAGNOSIS — O0933 Supervision of pregnancy with insufficient antenatal care, third trimester: Secondary | ICD-10-CM | POA: Insufficient documentation

## 2021-10-19 DIAGNOSIS — Z3A37 37 weeks gestation of pregnancy: Secondary | ICD-10-CM | POA: Diagnosis not present

## 2021-10-19 DIAGNOSIS — O99333 Smoking (tobacco) complicating pregnancy, third trimester: Secondary | ICD-10-CM | POA: Insufficient documentation

## 2021-10-19 LAB — URINE DRUG SCREEN, QUALITATIVE (ARMC ONLY)
Amphetamines, Ur Screen: NOT DETECTED
Barbiturates, Ur Screen: NOT DETECTED
Benzodiazepine, Ur Scrn: NOT DETECTED
Cannabinoid 50 Ng, Ur ~~LOC~~: NOT DETECTED
Cocaine Metabolite,Ur ~~LOC~~: POSITIVE — AB
MDMA (Ecstasy)Ur Screen: NOT DETECTED
Methadone Scn, Ur: NOT DETECTED
Opiate, Ur Screen: NOT DETECTED
Phencyclidine (PCP) Ur S: NOT DETECTED
Tricyclic, Ur Screen: NOT DETECTED

## 2021-10-19 LAB — URINALYSIS, COMPLETE (UACMP) WITH MICROSCOPIC
Bilirubin Urine: NEGATIVE
Glucose, UA: NEGATIVE mg/dL
Hgb urine dipstick: NEGATIVE
Ketones, ur: 20 mg/dL — AB
Nitrite: NEGATIVE
Protein, ur: 100 mg/dL — AB
Specific Gravity, Urine: 1.027 (ref 1.005–1.030)
WBC, UA: >50 WBC/hpf — ABNORMAL HIGH (ref 0–5)
pH: 5 (ref 5.0–8.0)

## 2021-10-19 MED ORDER — SOD CITRATE-CITRIC ACID 500-334 MG/5ML PO SOLN
30.0000 mL | Freq: Once | ORAL | Status: AC
Start: 2021-10-19 — End: 2021-10-19

## 2021-10-19 MED ORDER — LACTATED RINGERS IV SOLN: INTRAVENOUS | Status: DC

## 2021-10-19 MED ORDER — SOD CITRATE-CITRIC ACID 500-334 MG/5ML PO SOLN
ORAL | Status: AC
  Administered 2021-10-19: 30 mL via ORAL
  Filled 2021-10-19: qty 15

## 2021-10-19 MED ORDER — LACTATED RINGERS IV BOLUS
500.0000 mL | Freq: Once | INTRAVENOUS | Status: AC
  Administered 2021-10-19: 500 mL via INTRAVENOUS

## 2021-10-19 NOTE — Progress Notes (Addendum)
Pt eating and drinking. Pt has appointment at 9am for Iron Infusion here at [facility]

## 2021-10-19 NOTE — OB Triage Note (Signed)
Patient resting quietly in bed. RN at bedside to remove IV and give discharge instructions. Encouraged patient to keep appointment this morning for iron infusion. Reviewed labor precautions and follow up information with patient. Patient verbalized understanding and agreed to plan. IV removed and patient discharged in stable condition.

## 2021-10-19 NOTE — OB Triage Note (Addendum)
Pt arrived via EMS eating a chicken wrap. States she walked to get some food and then called 911. Irregular contractions x days. Pt admits to cocaine and etoh use with in the last "few days" Aware that we need a urine sample. Pt states all her babies come early and she feels like the head is coming out. Cervix 5/60/-2 firm + FM. Pt repeatedly falling asleep during questions

## 2021-10-19 NOTE — Discharge Summary (Signed)
Patient ID: Madison Singleton MRN: [id number] DOB/AGE: April 27, 1990 31 y.o.  Admit date: 10/19/2021 Discharge date: 10/19/2021  Admission Diagnoses: 31yo G8P7 at 101w4d presents with UCs and desire to be induced.  Pt admits to cocaine and etoh use with in the last "few days."   Discharge Diagnoses: Not in labor  Factors complicating pregnancy: Tobacco use Substance abuse: cocaine, alcohol, reported not current use for either at prenatal visit.  Iron deficiency anemia Mental health dx: bipolar, PTSD, Anxiety, Insomnia  Hx preterm births: pt states 5 of her babies born at 76wks and "induced" with membrane sweep Late PNC, initial visit at 25wks Chlamydia positive 6/20, pt states she vomited treatment.   Prenatal Procedures: NST  Consults: None  Significant Diagnostic Studies:  Results for orders placed or performed during the hospital encounter of 10/19/21 (from the past 168 hour(s))  Urine Drug Screen, Qualitative ([facility] only)   Collection Time: 10/19/21  6:44 AM  Result Value Ref Range   Tricyclic, Ur Screen NONE DETECTED NONE DETECTED   Amphetamines, Ur Screen NONE DETECTED NONE DETECTED   MDMA (Ecstasy)Ur Screen NONE DETECTED NONE DETECTED   Cocaine Metabolite,Ur [place] POSITIVE (A) NONE DETECTED   Opiate, Ur Screen NONE DETECTED NONE DETECTED   Phencyclidine (PCP) Ur S NONE DETECTED NONE DETECTED   Cannabinoid 50 Ng, Ur [name] NONE DETECTED NONE DETECTED   Barbiturates, Ur Screen NONE DETECTED NONE DETECTED   Benzodiazepine, Ur Scrn NONE DETECTED NONE DETECTED   Methadone Scn, Ur NONE DETECTED NONE DETECTED  Urinalysis, Complete w Microscopic Urine, Clean Catch   Collection Time: 10/19/21  6:45 AM  Result Value Ref Range   Color, Urine AMBER (A) YELLOW   APPearance CLOUDY (A) CLEAR   Specific Gravity, Urine 1.027 1.005 - 1.030   pH 5.0 5.0 - 8.0   Glucose, UA NEGATIVE NEGATIVE mg/dL   Hgb urine dipstick NEGATIVE NEGATIVE   Bilirubin Urine NEGATIVE NEGATIVE   Ketones, ur 20 (A)  NEGATIVE mg/dL   Protein, ur 542 (A) NEGATIVE mg/dL   Nitrite NEGATIVE NEGATIVE   Leukocytes,Ua LARGE (A) NEGATIVE   RBC / HPF 21-50 0 - 5 RBC/hpf   WBC, UA >50 (H) 0 - 5 WBC/hpf   Bacteria, UA MANY (A) NONE SEEN   Squamous Epithelial / LPF 11-20 0 - 5   Mucus PRESENT    Ca Oxalate Crys, UA PRESENT    Non Squamous Epithelial PRESENT (A) NONE SEEN    Treatments: IV hydration  Hospital Course:  This is a 31 y.o. [id number] with IUP at [redacted]w[redacted]d seen for labor evaluation, noted to have a cervical exam of 5/60/-2.  No leaking of fluid and no bleeding.  She was observed, fetal heart rate monitoring remained reassuring, and she had no signs/symptoms of progressing labor or other maternal-fetal concerns.  Her cervical exam was unchanged from admission.  She was deemed stable for discharge to home with outpatient follow up.  Discharge Physical Exam:  BP 113/72 (BP Location: Right Arm)   Pulse 93   Temp 97.8 F (36.6 C) (Oral)   Resp 18   Ht 5\' 1"  (1.549 m)   Wt 77.6 kg   LMP  (LMP Unknown)   BMI 32.31 kg/m   General: NAD CV: RRR Pulm: CTABL, nl effort ABD: s/nd/nt, gravid DVT Evaluation: LE non-ttp, no evidence of DVT on exam.  NST: FHR baseline: 120 bpm Variability: moderate Accelerations: yes Decelerations: none Category/reactivity: reactive   TOCO: irregular and mild SVE: deferred  Dilation: 3 Effacement (%): 80  Cervical Position: Middle Station: -2 Presentation: Vertex Exam by:: Annamary Rummage CNM   Discharge Condition: Stable  Disposition: Discharge disposition: 01-Home or Self Care        Allergies as of 10/19/2021       Reactions   Haldol [haloperidol Lactate]    Throat swelling   Amoxicillin Rash   Did it involve swelling of the face/tongue/throat, SOB, or low BP? Unknown Did it involve sudden or severe rash/hives, skin peeling, or any reaction on the inside of your mouth or nose? Yes Did you need to seek medical attention at a hospital or doctor's office?  Yes When did it last happen? Childhood reaction. If all above answers are "NO", may proceed with cephalosporin use.        Medication List     TAKE these medications    pantoprazole 40 MG tablet Commonly known as: PROTONIX Take 40 mg by mouth daily.   PRENATAL PO Take 1 tablet by mouth daily.        Follow-up Information     [facility] Follow up.   Why: Keep all scheduled appointments Contact information: [address] [place] [address] [phone]                Signed:  Quillian Quince 10/19/2021 7:55 AM

## 2021-10-19 NOTE — Progress Notes (Signed)
Pt wants her labor induced. Explained that we do not incduce people until 39 weeks. Pt states she will leave and go to [facility]. Pt has a scheduled iron infusion at 9am. Will stay for extended labor evaluation and be seen by midwife around 8am and then will either get admitted or go have her iron infusion or per pt she might still just go to [facility].

## 2021-10-23 ENCOUNTER — Encounter: Payer: Self-pay | Admitting: Obstetrics and Gynecology

## 2021-10-23 ENCOUNTER — Inpatient Hospital Stay
Admission: EM | Admit: 2021-10-23 | Discharge: 2021-10-25 | DRG: 807 | Disposition: A | Discharge status: Home / Self Care | Payer: Medicaid Other | Attending: Obstetrics and Gynecology | Admitting: Obstetrics and Gynecology

## 2021-10-23 ENCOUNTER — Other Ambulatory Visit: Payer: Self-pay

## 2021-10-23 ENCOUNTER — Ambulatory Visit
Admission: RE | Admit: 2021-10-23 | Discharge: 2021-10-23 | Disposition: A | Discharge status: Home / Self Care | Payer: Medicaid Other | Source: Ambulatory Visit | Attending: Obstetrics and Gynecology | Admitting: Obstetrics and Gynecology

## 2021-10-23 DIAGNOSIS — O9902 Anemia complicating childbirth: Secondary | ICD-10-CM | POA: Diagnosis present

## 2021-10-23 DIAGNOSIS — Z3A00 Weeks of gestation of pregnancy not specified: Secondary | ICD-10-CM | POA: Insufficient documentation

## 2021-10-23 DIAGNOSIS — D509 Iron deficiency anemia, unspecified: Secondary | ICD-10-CM | POA: Diagnosis present

## 2021-10-23 DIAGNOSIS — F17210 Nicotine dependence, cigarettes, uncomplicated: Secondary | ICD-10-CM | POA: Diagnosis present

## 2021-10-23 DIAGNOSIS — Z3A38 38 weeks gestation of pregnancy: Secondary | ICD-10-CM | POA: Diagnosis not present

## 2021-10-23 DIAGNOSIS — F1410 Cocaine abuse, uncomplicated: Secondary | ICD-10-CM | POA: Diagnosis present

## 2021-10-23 DIAGNOSIS — O99324 Drug use complicating childbirth: Secondary | ICD-10-CM | POA: Diagnosis present

## 2021-10-23 DIAGNOSIS — O99334 Smoking (tobacco) complicating childbirth: Secondary | ICD-10-CM | POA: Diagnosis present

## 2021-10-23 DIAGNOSIS — O26893 Other specified pregnancy related conditions, third trimester: Principal | ICD-10-CM | POA: Diagnosis present

## 2021-10-23 DIAGNOSIS — Z23 Encounter for immunization: Secondary | ICD-10-CM

## 2021-10-23 DIAGNOSIS — O99019 Anemia complicating pregnancy, unspecified trimester: Secondary | ICD-10-CM | POA: Insufficient documentation

## 2021-10-23 LAB — URINE DRUG SCREEN, QUALITATIVE (ARMC ONLY)
Amphetamines, Ur Screen: NOT DETECTED
Barbiturates, Ur Screen: NOT DETECTED
Benzodiazepine, Ur Scrn: NOT DETECTED
Cannabinoid 50 Ng, Ur ~~LOC~~: NOT DETECTED
Cocaine Metabolite,Ur ~~LOC~~: POSITIVE — AB
MDMA (Ecstasy)Ur Screen: NOT DETECTED
Methadone Scn, Ur: NOT DETECTED
Opiate, Ur Screen: NOT DETECTED
Phencyclidine (PCP) Ur S: NOT DETECTED
Tricyclic, Ur Screen: NOT DETECTED

## 2021-10-23 LAB — URINALYSIS, COMPLETE (UACMP) WITH MICROSCOPIC
Bilirubin Urine: NEGATIVE
Glucose, UA: NEGATIVE mg/dL
Ketones, ur: 5 mg/dL — AB
Nitrite: NEGATIVE
Protein, ur: NEGATIVE mg/dL
Specific Gravity, Urine: 1.002 — ABNORMAL LOW (ref 1.005–1.030)
WBC, UA: >50 WBC/hpf — ABNORMAL HIGH (ref 0–5)
pH: 7 (ref 5.0–8.0)

## 2021-10-23 LAB — TYPE AND SCREEN
ABO/RH(D): AB POS
Antibody Screen: NEGATIVE

## 2021-10-23 LAB — CBC
HCT: 31.1 % — ABNORMAL LOW (ref 36.0–46.0)
Hemoglobin: 9.8 g/dL — ABNORMAL LOW (ref 12.0–15.0)
MCH: 23.2 pg — ABNORMAL LOW (ref 26.0–34.0)
MCHC: 31.5 g/dL (ref 30.0–36.0)
MCV: 73.5 fL — ABNORMAL LOW (ref 80.0–100.0)
Platelets: 333 10*3/uL (ref 150–400)
RBC: 4.23 MIL/uL (ref 3.87–5.11)
RDW: 24.4 % — ABNORMAL HIGH (ref 11.5–15.5)
WBC: 12.7 10*3/uL — ABNORMAL HIGH (ref 4.0–10.5)
nRBC: 0 % (ref 0.0–0.2)

## 2021-10-23 MED ORDER — LACTATED RINGERS IV SOLN
500.0000 mL | INTRAVENOUS | Status: DC | PRN
  Administered 2021-10-23: 1000 mL via INTRAVENOUS

## 2021-10-23 MED ORDER — IBUPROFEN 600 MG PO TABS
600.0000 mg | ORAL_TABLET | Freq: Four times a day (QID) | ORAL | Status: DC
  Administered 2021-10-23 – 2021-10-25 (×6): 600 mg via ORAL
  Filled 2021-10-23 (×8): qty 1

## 2021-10-23 MED ORDER — OXYTOCIN-SODIUM CHLORIDE 30-0.9 UT/500ML-% IV SOLN
2.5000 [IU]/h | INTRAVENOUS | Status: DC
  Filled 2021-10-23: qty 500

## 2021-10-23 MED ORDER — CEFAZOLIN SODIUM-DEXTROSE 1-4 GM/50ML-% IV SOLN
1.0000 g | Freq: Four times a day (QID) | INTRAVENOUS | Status: DC
  Filled 2021-10-23: qty 50

## 2021-10-23 MED ORDER — ACETAMINOPHEN 325 MG PO TABS
650.0000 mg | ORAL_TABLET | ORAL | Status: DC | PRN
  Administered 2021-10-23 – 2021-10-25 (×8): 650 mg via ORAL
  Filled 2021-10-23 (×9): qty 2

## 2021-10-23 MED ORDER — LIDOCAINE HCL (PF) 1 % IJ SOLN
30.0000 mL | INTRAMUSCULAR | Status: DC | PRN
  Filled 2021-10-23: qty 30

## 2021-10-23 MED ORDER — LACTATED RINGERS IV SOLN: INTRAVENOUS | Status: DC

## 2021-10-23 MED ORDER — FENTANYL CITRATE (PF) 100 MCG/2ML IJ SOLN
50.0000 ug | INTRAMUSCULAR | Status: DC | PRN
  Administered 2021-10-23: 50 ug via INTRAVENOUS
  Filled 2021-10-23: qty 2

## 2021-10-23 MED ORDER — TERBUTALINE SULFATE 1 MG/ML IJ SOLN: 0.2500 mg | Freq: Once | INTRAMUSCULAR | Status: DC | PRN

## 2021-10-23 MED ORDER — METRONIDAZOLE 500 MG PO TABS
2000.0000 mg | ORAL_TABLET | Freq: Once | ORAL | Status: AC
  Administered 2021-10-23: 2000 mg via ORAL
  Filled 2021-10-23: qty 4

## 2021-10-23 MED ORDER — ACETAMINOPHEN 325 MG PO TABS
650.0000 mg | ORAL_TABLET | ORAL | Status: DC | PRN
  Filled 2021-10-23: qty 2

## 2021-10-23 MED ORDER — SODIUM CHLORIDE 0.9 % IV SOLN
300.0000 mg | Freq: Once | INTRAVENOUS | Status: AC
  Administered 2021-10-23: 300 mg via INTRAVENOUS
  Filled 2021-10-23: qty 15

## 2021-10-23 MED ORDER — MISOPROSTOL 200 MCG PO TABS
ORAL_TABLET | ORAL | Status: AC
  Administered 2021-10-23: 800 ug via RECTAL
  Filled 2021-10-23: qty 4

## 2021-10-23 MED ORDER — ONDANSETRON HCL 4 MG/2ML IJ SOLN: 4.0000 mg | Freq: Four times a day (QID) | INTRAMUSCULAR | Status: DC | PRN

## 2021-10-23 MED ORDER — OXYTOCIN-SODIUM CHLORIDE 30-0.9 UT/500ML-% IV SOLN: 1.0000 m[IU]/min | INTRAVENOUS | Status: DC

## 2021-10-23 MED ORDER — OXYTOCIN BOLUS FROM INFUSION: 333.0000 mL | Freq: Once | INTRAVENOUS | Status: DC

## 2021-10-23 MED ORDER — SOD CITRATE-CITRIC ACID 500-334 MG/5ML PO SOLN: 30.0000 mL | ORAL | Status: DC | PRN

## 2021-10-23 MED ORDER — CEFAZOLIN SODIUM-DEXTROSE 2-4 GM/100ML-% IV SOLN
2.0000 g | Freq: Once | INTRAVENOUS | Status: AC
  Administered 2021-10-23: 2 g via INTRAVENOUS
  Filled 2021-10-23: qty 100

## 2021-10-23 NOTE — Discharge Summary (Signed)
Obstetrical Discharge Summary  Patient Name: Madison Singleton DOB: 15-Jun-1990 MRN: [id number]  Date of Admission: 10/23/2021 Date of Delivery: 10/23/21 Delivered by: Dala Dock CNM Date of Discharge: 10/25/2021  Primary OB: [facility]  LMP:No LMP recorded (lmp unknown). Patient is pregnant. EDC Estimated Date of Delivery: 11/05/21 Gestational Age at Delivery: [redacted]w[redacted]d   Antepartum complications:  Tobacco use Substance abuse: cocaine, alcohol,; last Cocaine use 7/31 per pt.  Iron deficiency anemia Mental health dx: bipolar, PTSD, Anxiety, Insomnia  Hx preterm births: pt states 5 of her babies born at 60wks and "induced" with membrane sweep Late PNC, initial visit at 25wks Chlamydia positive 6/20, pt states she vomited treatment.  Trich 7/28 at [facility]  Admitting Diagnosis:  Secondary Diagnosis: Patient Active Problem List   Diagnosis Date Noted   Uterine contractions 10/19/2021   Preterm uterine contractions in third trimester, antepartum 10/04/2021   Supervision of high risk pregnancy in third trimester 03/25/2018   Pelvic pressure in pregnancy, antepartum, third trimester 02/26/2018   Generalized anxiety disorder 07/11/2016   Cocaine abuse (HCC) 07/11/2016   History of depression 03/12/2013    Augmentation: N/A Complications: None Intrapartum complications/course: arrived with SROM and contractions, rapidly progressed to C/C/+2 and pushed with 2 UCs over intact perineum.  Date of Delivery: 10/23/21 Delivered By: Dala Dock CNM Delivery Type: spontaneous vaginal delivery Anesthesia: none Placenta: spontaneous Laceration: none Episiotomy: none Newborn Data: Live born female  Birth Weight: 7 lb 4.4 oz (3300 g) APGAR: 8, 9  Newborn Delivery   Birth date/time: 10/23/2021 21:51:00 Delivery type: Vaginal, Spontaneous     Postpartum Procedures: none  Edinburgh:     03/26/2018    3:23 AM  Madison Singleton Postnatal Depression Scale Screening Tool  I have been able to laugh and see the  funny side of things. 0  I have looked forward with enjoyment to things. 0  I have blamed myself unnecessarily when things went wrong. 1  I have been anxious or worried for no good reason. 0  I have felt scared or panicky for no good reason. 0  Things have been getting on top of me. 2  I have been so unhappy that I have had difficulty sleeping. 1  I have felt sad or miserable. 2  I have been so unhappy that I have been crying. 2  The thought of harming myself has occurred to me. 0  Edinburgh Postnatal Depression Scale Total 8     Post partum course:  Patient had an uncomplicated postpartum course.  By time of discharge on PPD#2, her pain was controlled on oral pain medications; she had appropriate lochia and was ambulating, voiding without difficulty and tolerating regular diet.  She was deemed stable for discharge to home.    Discharge Physical Exam:   BP 120/78 (BP Location: Left Arm)   Pulse 82   Temp 98.3 F (36.8 C) (Oral)   Resp (!) 22   LMP  (LMP Unknown)   SpO2 100%   Breastfeeding Unknown   General: NAD CV: RRR Pulm: CTABL, nl effort ABD: s/nd/nt, fundus firm and below the umbilicus Lochia: moderate Perineum: intact DVT Evaluation: LE non-ttp, no evidence of DVT on exam.  Hemoglobin  Date Value Ref Range Status  10/24/2021 9.0 (L) 12.0 - 15.0 g/dL Final   HGB  Date Value Ref Range Status  08/24/2013 12.1 12.0 - 16.0 g/dL Final   HCT  Date Value Ref Range Status  10/24/2021 29.3 (L) 36.0 - 46.0 % Final  08/24/2013 38.4 35.0 -  47.0 % Final    Disposition: stable, discharge to home. Baby Feeding: formula Baby Disposition: kept in SCN, not being discharged home with mom, DSS is trying to set up infant discharge with family member  Rh Immune globulin given: n/a Rubella vaccine given: NON-immune- offered prior to DC Varicella vaccine given: immune Tdap vaccine given in AP or PP setting: 09/11/21 Flu vaccine given in AP or PP setting: n/a  Contraception:  Depo  Prenatal Labs:  Blood type/Rh AB Pos  Antibody screen neg  Rubella  NON- Immune  Varicella Immune  RPR NR  HBsAg Neg  HIV NR  GC neg  Chlamydia neg  Genetic screening  Neg MaterniT21  1 hour GTT    3 hour GTT    GBS  Pos at [facility]      Plan:  Madison Singleton was discharged to home in good condition. Follow-up appointment with delivering provider in 2wks for mood check and 6 weeks.  Discharge Medications: Allergies as of 10/25/2021       Reactions   Haldol [haloperidol Lactate]    Throat swelling   Amoxicillin Rash   Did it involve swelling of the face/tongue/throat, SOB, or low BP? Unknown Did it involve sudden or severe rash/hives, skin peeling, or any reaction on the inside of your mouth or nose? Yes Did you need to seek medical attention at a hospital or doctor's office? Yes When did it last happen? Childhood reaction. If all above answers are "NO", may proceed with cephalosporin use.        Medication List     STOP taking these medications    pantoprazole 40 MG tablet Commonly known as: PROTONIX       TAKE these medications    acetaminophen 325 MG tablet Commonly known as: Tylenol Take 2 tablets (650 mg total) by mouth every 4 (four) hours as needed (for pain scale < 4).   benzocaine-Menthol 20-0.5 % Aero Commonly known as: DERMOPLAST Apply 1 Application topically as needed for irritation (perineal discomfort).   ferrous sulfate 325 (65 FE) MG tablet Take 1 tablet (325 mg total) by mouth 2 (two) times daily with a meal.   ibuprofen 600 MG tablet Commonly known as: ADVIL Take 1 tablet (600 mg total) by mouth every 6 (six) hours.   PRENATAL PO Take 1 tablet by mouth daily.   witch hazel-glycerin pad Commonly known as: TUCKS Apply 1 Application topically as needed for hemorrhoids.         Follow-up Information     McVey, Prudencio Pair, CNM Follow up in 2 week(s).   Specialty: Obstetrics and Gynecology Why: 2wk mood check Contact  information: [address] [place] [address] [phone]         McVey, Prudencio Pair, CNM Follow up in 6 week(s).   Specialty: Obstetrics and Gynecology Why: 6wk postpartum Contact information: [address] [place] [address] [phone]                 Signed:  Janyce Llanos, CNM 10/25/2021  12:09 PM

## 2021-10-23 NOTE — H&P (Signed)
OB History & Physical   History of Present Illness:  Chief Complaint: water broken and contractions  HPI:  Madison Singleton is a 31 y.o. [id number] female at [redacted]w[redacted]d dated by LMP and [place].  She presents to L&D for SROM  Active FM, LOF  / SROM @ 2005 this eve, denies bloody show; pain ful UCs q3-4    Pregnancy Issues: Tobacco use Substance abuse: cocaine, alcohol,; last Cocaine use 7/31 per pt.  Iron deficiency anemia Mental health dx: bipolar, PTSD, Anxiety, Insomnia  Hx preterm births: pt states 5 of her babies born at 45wks and "induced" with membrane sweep Late PNC, initial visit at 25wks Chlamydia positive 6/20, pt states she vomited treatment.  Trich 7/28 at [facility]   Maternal Medical History:   Past Medical History:  Diagnosis Date   Anxiety    NO MEDS   Arthritis of ankle    Breast mass    Breast tenderness in female    3;00 x3 weeks   Chronic pain syndrome    Depression    NO MEDS   GERD (gastroesophageal reflux disease)    OCC-NO MEDS   Headache    HAS RECENTLY STARTED HAVING HA'S MORE FREQUENTLY   Obesity    OSA (obstructive sleep apnea)     Past Surgical History:  Procedure Laterality Date   KNEE ARTHROSCOPY WITH MEDIAL MENISECTOMY Right 09/07/2015   Procedure: Arthroscopic Lateral Release ;  Surgeon: Kennedy Bucker, MD;  Location: [facility] ORS;  Service: Orthopedics;  Laterality: Right;   MOUTH SURGERY  12/2018   ORIF ANKLE FRACTURE Right 02/16/2019   Procedure: OPEN REDUCTION INTERNAL FIXATION (ORIF) ANKLE FRACTURE;  Surgeon: Lyndle Herrlich, MD;  Location: [facility] ORS;  Service: Orthopedics;  Laterality: Right;    Allergies  Allergen Reactions   Haldol [Haloperidol Lactate]     Throat swelling   Amoxicillin Rash    Did it involve swelling of the face/tongue/throat, SOB, or low BP? Unknown Did it involve sudden or severe rash/hives, skin peeling, or any reaction on the inside of your mouth or nose? Yes Did you need to seek medical attention at a hospital or doctor's  office? Yes When did it last happen? Childhood reaction. If all above answers are "NO", may proceed with cephalosporin use.     Prior to Admission medications   Medication Sig Start Date End Date Taking? Authorizing Provider  pantoprazole (PROTONIX) 40 MG tablet Take 40 mg by mouth daily.    [provider]  Prenatal Vit-Fe Fumarate-FA (PRENATAL PO) Take 1 tablet by mouth daily.    [provider]     Prenatal care site: [facility]   Social History: She  reports that she has been smoking cigarettes. She has a 1.40 pack-year smoking history. She has never used smokeless tobacco. She reports current alcohol use of about 1.0 standard drink of alcohol per week. She reports current drug use. Drug: Cocaine.  Family History: family history includes Breast cancer in her cousin.   Review of Systems: A full review of systems was performed and negative except as noted in the HPI.     Physical Exam:  Vital Signs: LMP  (LMP Unknown)  General: no acute distress.  HEENT: normocephalic, atraumatic Heart: regular rate & rhythm.  No murmurs/rubs/gallops Lungs: clear to auscultation bilaterally, normal respiratory effort Abdomen: soft, gravid, non-tender;  EFW: 6.5lbs Pelvic:   External: Normal external female genitalia  Cervix: Dilation: 7.5 / Effacement (%): 100 / Station: 0    Extremities:  non-tender, symmetric, No edema bilaterally.  DTRs: 2+  Neurologic: Alert & oriented x 3.    Results for orders placed or performed during the hospital encounter of 10/23/21 (from the past 24 hour(s))  CBC     Status: Abnormal   Collection Time: 10/23/21  8:56 PM  Result Value Ref Range   WBC 12.7 (H) 4.0 - 10.5 K/uL   RBC 4.23 3.87 - 5.11 MIL/uL   Hemoglobin 9.8 (L) 12.0 - 15.0 g/dL   HCT 26.9 (L) 48.5 - 46.2 %   MCV 73.5 (L) 80.0 - 100.0 fL   MCH 23.2 (L) 26.0 - 34.0 pg   MCHC 31.5 30.0 - 36.0 g/dL   RDW 70.3 (H) 50.0 - 93.8 %   Platelets 333 150 - 400 K/uL   nRBC 0.0  0.0 - 0.2 %  Type and screen [facility]     Status: None (Preliminary result)   Collection Time: 10/23/21  8:56 PM  Result Value Ref Range   ABO/RH(D) PENDING    Antibody Screen PENDING    Sample Expiration      10/26/2021,2359 Performed at [facility], [address] [place], [place] [address]     Pertinent Results:  Prenatal Labs: Blood type/Rh AB Pos  Antibody screen neg  Rubella  NON- Immune  Varicella Immune  RPR NR  HBsAg Neg  HIV NR  GC neg  Chlamydia neg  Genetic screening  Neg MaterniT21  1 hour GTT   3 hour GTT   GBS  Pos at [facility]    FHT: 130bpm, mod var TOCO: assessing, q3-41min per pt. SVE:  4-5/C/-2 with clear fluid, grossly ruptured on admit.    Cephalic by leopolds/SVE  No results found.  Assessment:  Madison Singleton is a 31 y.o. [phone] female at [redacted]w[redacted]d with SROM and labor.   Plan:  1. Admit to Labor & Delivery; consents reviewed and obtained - notified Dr Feliberto Gottron of admission  2. Fetal Well being  - Fetal Tracing: Cat II- variables - Group B Streptococcus ppx indicated: Pos- Ancef ordered - Presentation: cephalic confirmed by exam   3. Routine OB: - Prenatal labs reviewed, as above - Rh AB pos - CBC, T&S, RPR on admit - Clear fluids, IVF  4. Monitoring of Labor -  Contractions -external toco in place -  Pelvis proven  -  Plan for continuous fetal monitoring  -  Maternal pain control as desired; requesting IVPM - Anticipate vaginal delivery  5. Post Partum Planning: - Infant feeding: formula - Contraception: TBD  Randa Ngo, CNM 10/23/21 9:43 PM

## 2021-10-24 LAB — CBC
HCT: 29.3 % — ABNORMAL LOW (ref 36.0–46.0)
Hemoglobin: 9 g/dL — ABNORMAL LOW (ref 12.0–15.0)
MCH: 23 pg — ABNORMAL LOW (ref 26.0–34.0)
MCHC: 30.7 g/dL (ref 30.0–36.0)
MCV: 74.9 fL — ABNORMAL LOW (ref 80.0–100.0)
Platelets: 319 10*3/uL (ref 150–400)
RBC: 3.91 MIL/uL (ref 3.87–5.11)
RDW: 24.4 % — ABNORMAL HIGH (ref 11.5–15.5)
WBC: 14.6 10*3/uL — ABNORMAL HIGH (ref 4.0–10.5)
nRBC: 0 % (ref 0.0–0.2)

## 2021-10-24 LAB — RPR: RPR Ser Ql: NONREACTIVE

## 2021-10-24 MED ORDER — DIPHENHYDRAMINE HCL 25 MG PO CAPS: 25.0000 mg | ORAL_CAPSULE | Freq: Four times a day (QID) | ORAL | Status: DC | PRN

## 2021-10-24 MED ORDER — PRENATAL MULTIVITAMIN CH
1.0000 | ORAL_TABLET | Freq: Every day | ORAL | Status: DC
  Administered 2021-10-24 – 2021-10-25 (×2): 1 via ORAL
  Filled 2021-10-24 (×2): qty 1

## 2021-10-24 MED ORDER — SODIUM CHLORIDE 0.9 % IV SOLN: INTRAVENOUS | Status: DC | PRN

## 2021-10-24 MED ORDER — FERROUS SULFATE 325 (65 FE) MG PO TABS
325.0000 mg | ORAL_TABLET | Freq: Two times a day (BID) | ORAL | Status: DC
  Administered 2021-10-24 – 2021-10-25 (×4): 325 mg via ORAL
  Filled 2021-10-24 (×4): qty 1

## 2021-10-24 MED ORDER — PANTOPRAZOLE SODIUM 40 MG PO TBEC
40.0000 mg | DELAYED_RELEASE_TABLET | Freq: Every day | ORAL | Status: DC
  Administered 2021-10-24 – 2021-10-25 (×2): 40 mg via ORAL
  Filled 2021-10-24 (×2): qty 1

## 2021-10-24 MED ORDER — DIBUCAINE (PERIANAL) 1 % EX OINT: 1.0000 | TOPICAL_OINTMENT | CUTANEOUS | Status: DC | PRN

## 2021-10-24 MED ORDER — MEDROXYPROGESTERONE ACETATE 150 MG/ML IM SUSP
150.0000 mg | INTRAMUSCULAR | Status: DC | PRN
  Filled 2021-10-24: qty 1

## 2021-10-24 MED ORDER — BENZOCAINE-MENTHOL 20-0.5 % EX AERO: 1.0000 | INHALATION_SPRAY | CUTANEOUS | Status: DC | PRN

## 2021-10-24 MED ORDER — MEASLES, MUMPS & RUBELLA VAC IJ SOLR
0.5000 mL | INTRAMUSCULAR | Status: AC | PRN
  Administered 2021-10-25: 0.5 mL via SUBCUTANEOUS
  Filled 2021-10-24 (×2): qty 0.5

## 2021-10-24 MED ORDER — ONDANSETRON HCL 4 MG PO TABS: 4.0000 mg | ORAL_TABLET | ORAL | Status: DC | PRN

## 2021-10-24 MED ORDER — MISOPROSTOL 200 MCG PO TABS: 800.0000 ug | ORAL_TABLET | Freq: Once | ORAL | Status: AC

## 2021-10-24 MED ORDER — HYDROXYZINE HCL 25 MG PO TABS
50.0000 mg | ORAL_TABLET | ORAL | Status: DC | PRN
  Administered 2021-10-24 – 2021-10-25 (×6): 50 mg via ORAL
  Filled 2021-10-24 (×6): qty 2

## 2021-10-24 MED ORDER — ZOLPIDEM TARTRATE 5 MG PO TABS
5.0000 mg | ORAL_TABLET | Freq: Every evening | ORAL | Status: DC | PRN
  Administered 2021-10-24: 5 mg via ORAL
  Filled 2021-10-24: qty 1

## 2021-10-24 MED ORDER — WITCH HAZEL-GLYCERIN EX PADS: 1.0000 | MEDICATED_PAD | CUTANEOUS | Status: DC | PRN

## 2021-10-24 MED ORDER — OXYCODONE HCL 5 MG PO TABS
5.0000 mg | ORAL_TABLET | Freq: Four times a day (QID) | ORAL | Status: DC | PRN
  Administered 2021-10-24 – 2021-10-25 (×8): 5 mg via ORAL
  Filled 2021-10-24 (×8): qty 1

## 2021-10-24 MED ORDER — SODIUM CHLORIDE 0.9 % IV SOLN
300.0000 mg | Freq: Once | INTRAVENOUS | Status: AC
  Administered 2021-10-24: 300 mg via INTRAVENOUS
  Filled 2021-10-24: qty 300

## 2021-10-24 MED ORDER — SIMETHICONE 80 MG PO CHEW: 80.0000 mg | CHEWABLE_TABLET | ORAL | Status: DC | PRN

## 2021-10-24 MED ORDER — SENNOSIDES-DOCUSATE SODIUM 8.6-50 MG PO TABS
2.0000 | ORAL_TABLET | Freq: Every day | ORAL | Status: DC
  Administered 2021-10-24 – 2021-10-25 (×2): 2 via ORAL
  Filled 2021-10-24 (×2): qty 2

## 2021-10-24 MED ORDER — ONDANSETRON HCL 4 MG/2ML IJ SOLN: 4.0000 mg | INTRAMUSCULAR | Status: DC | PRN

## 2021-10-24 MED ORDER — COCONUT OIL OIL: 1.0000 | TOPICAL_OIL | Status: DC | PRN

## 2021-10-24 NOTE — Progress Notes (Signed)
CPS arrived to visit patient around 1530. CPS asked visitors to leave that were in the room.   Notified by CPS that mother is not allowed to have infant in the room with her and could visit in nursery.   Infant removed from the room by nurse and taken to the nursery. Explained process of visiting in there. CPS also explained that Aunt Susy Frizzle can visit infant but no other visitors.   CPS stated they would notify us with an update tomorrow (8/3).  Case worker: Venetia Constable   Mother very agitated and upset upon CPS leaving. Nurse and nurse tech tried to calm patient down. Patient stated she needed a med for anxiety. Nurse called Harrington Challenger, CNM and she put in an order for Atarax. Given to patient at 1754. Patient called out 2 more times and requested something to put her to sleep.   Nurse called and verified with pharmacy that Atarax could be given with Ambien. Ambien given at 1852.   Report given to night shift nurse. Explained importance of keeping infant in nursery and wait to hear from CPS tomorrow for an update on plan of care for infant.

## 2021-10-24 NOTE — Progress Notes (Signed)
Spoke with McVey CNM per pt request for a stronger pain medication. Relayed to CNM that the hospital currently is out of k-pads. See new orders.

## 2021-10-24 NOTE — Progress Notes (Signed)
S: Notified by nurse that CPS putting the baby in the nursery and that the mom must have supervision during visits only.  Pt is very agitated. O:  Today's Vitals   10/24/21 0907 10/24/21 1155 10/24/21 1420 10/24/21 1509  BP: (!) 103/58 100/61    Pulse: 67 68    Resp: 18 17    Temp:  98.1 F (36.7 C)    TempSrc:      SpO2: 98% 98%    PainSc:   8  4    There is no height or weight on file to calculate BMI.   A: Situational agitation  P: Hydroxyzine 50mg  q4hr

## 2021-10-24 NOTE — Clinical Social Work Maternal (Signed)
CLINICAL SOCIAL WORK MATERNAL/CHILD NOTE  Patient Details  Name: Madison Singleton MRN: [id number] Date of Birth: May 07, 1990  Date:  10/24/2021  Clinical Social Worker Initiating Note:  Doran Clay RN BSN Case Manager Date/Time: Initiated:  10/24/21/1156     Child's Name:  Madison Singleton   Biological Parents:  Mother, Father   Need for Interpreter:  None   Reason for Referral:  Current Substance Use/Substance Use During Pregnancy     Address:  [address] [place] [address]    Phone number:  [phone] (home)     Additional phone number: NA  Household Members/Support Persons (HM/SP):   Household Member/Support Person 1   HM/SP Name Relationship DOB or Age  HM/SP -1 Madison Singleton Husband    HM/SP -2        HM/SP -3        HM/SP -4        HM/SP -5        HM/SP -6        HM/SP -7        HM/SP -8          Natural Supports (not living in the home):  Immediate Family   Professional Supports:     Employment: Unemployed   Type of Work:     Education:      Homebound arranged:    Pensions consultant:  Medicaid   Other Resources:  [employer], Physicist, medical     Cultural/Religious Considerations Which May Impact Care:  NA  Strengths:  Pediatrician chosen   Psychotropic Medications:         Pediatrician:    Ambulance person List:   [place]  [place]      Pediatrician Fax Number:    Risk Factors/Current Problems:  Substance Use  , Compliance with Treatment  , Basic Needs     Cognitive State:  Alert  , Distractible     Mood/Affect:  Blunted  , Irritable     CSW Assessment: RNCM met with patient at the bedside, patient holding infant and feeding a bottle, on hold with pediatrician office, [facility] to make a new patient appointment.  Infant is baby boy Madison Singleton, father is Madison Singleton.   RNCM explained reason for [facility]  consult because patient admitted to cocaine use and UDS positive for cocaine, previous visits also admitted to cocaine and alcohol use during pregnancy.  Informed patient that CPS report will be made.  Patient questioned why a report had to be made- RNCM explained that it was a policy that any drug use during pregnancy at birth was required to be reported to [place].   Patient reports that she does not have a problem with cocaine and does not need any kind of treatment.  This is patient's 8th child, previous children do not live in the home with the parents, patient reports that they are with family.  Patient reports that she has everything she needs at home for the baby, working on pediatrician appointment.  Nursing expressed concerns as to weather patient really did have necessities for newborn at home.  Pediatrician also has concerns about infant discharging home with mother.   Patient also had limited prenatal care.  RNCM made a CPS report to [place], report taken by [facility].    CSW Plan/Description:  Child Protective Service Report  Shelbie Hutching, RN 10/24/2021, 1:58 PM

## 2021-10-24 NOTE — Progress Notes (Signed)
Patient called out requesting pain medication and also wanting to visit infant in SCN with another aunt. This was not the aunt CPS approved to visit infant. Explained to patient she could visit infant but this aunt could not. Aunt that was visiting left. Patient then asked if she could leave the unit to go smoke. Educated to patient that I had just given her some oxycodone and I needed to monitor her and that we typically don't let patients leave the floor but I would contact the [facility]. I also explained that we could see about getting a nicotine patch. Patient stated "we could not keep her here and she did not want a patch". I asked her if she wanted to leave and that she could sign AMA papers but if she left AMA she would not be able to come back and visit infant. Patient was silent and then said "I'm good, thanks". I then asked if there was anything else I could do. Patient stated "no, thanks". Will continue to monitor.

## 2021-10-24 NOTE — Progress Notes (Signed)
Post Partum Day 1  Subjective: Doing well, no concerns. Ambulating without difficulty, pain managed with PO meds, tolerating regular diet, and voiding without difficulty.   No fever/chills, chest pain, shortness of breath, nausea/vomiting, or leg pain. No nipple or breast pain. No headache, visual changes, or RUQ/epigastric pain.  Objective: BP 101/63 (BP Location: Right Arm)   Pulse 78   Temp 98.5 F (36.9 C)   Resp 18   LMP  (LMP Unknown)   SpO2 98%   Breastfeeding Unknown    Physical Exam:  General: alert and cooperative Breasts: soft/nontender CV: RRR Pulm: nl effort Abdomen: soft, non-tender Uterine Fundus: firm Incision: n/a Perineum: minimal edema, intact Lochia: appropriate DVT Evaluation: No evidence of DVT seen on physical exam. Edinburgh:     03/26/2018    3:23 AM  Flavia Shipper Postnatal Depression Scale Screening Tool  I have been able to laugh and see the funny side of things. 0  I have looked forward with enjoyment to things. 0  I have blamed myself unnecessarily when things went wrong. 1  I have been anxious or worried for no good reason. 0  I have felt scared or panicky for no good reason. 0  Things have been getting on top of me. 2  I have been so unhappy that I have had difficulty sleeping. 1  I have felt sad or miserable. 2  I have been so unhappy that I have been crying. 2  The thought of harming myself has occurred to me. 0  Edinburgh Postnatal Depression Scale Total 8     Recent Labs    10/23/21 2056 10/24/21 0516  HGB 9.8* 9.0*  HCT 31.1* 29.3*  WBC 12.7* 14.6*  PLT 333 319    Assessment/Plan: 31 y.o. [id number] postpartum day # 1  -Anticipate SW consult today -Continue routine postpartum care -Lactation consult PRN for breastfeeding   -Discussed contraceptive options including implant, IUDs hormonal and non-hormonal, injection, pills/ring/patch, condoms, and NFP.  -Acute blood loss anemia - hemodynamically stable and asymptomatic; start  PO ferrous sulfate BID with stool softeners  -Immunization status: Needs MMR prior to discharge  Disposition: Continue inpatient postpartum care    LOS: 1 day   Wynelle Dreier, CNM 10/24/2021, 8:46 AM

## 2021-10-25 MED ORDER — HYDROXYZINE HCL 50 MG PO TABS: 50.0000 mg | ORAL_TABLET | ORAL | 0 refills | Status: DC | PRN

## 2021-10-25 MED ORDER — IBUPROFEN 600 MG PO TABS: 600.0000 mg | ORAL_TABLET | Freq: Four times a day (QID) | ORAL | 0 refills | Status: DC

## 2021-10-25 MED ORDER — NORETHINDRONE 0.35 MG PO TABS: 1.0000 | ORAL_TABLET | Freq: Every day | ORAL | 3 refills | Status: DC

## 2021-10-25 MED ORDER — BENZOCAINE-MENTHOL 20-0.5 % EX AERO: 1.0000 | INHALATION_SPRAY | CUTANEOUS | Status: DC | PRN

## 2021-10-25 MED ORDER — FERROUS SULFATE 325 (65 FE) MG PO TABS: 325.0000 mg | ORAL_TABLET | Freq: Two times a day (BID) | ORAL | 3 refills | Status: DC

## 2021-10-25 MED ORDER — WITCH HAZEL-GLYCERIN EX PADS: 1.0000 | MEDICATED_PAD | CUTANEOUS | 12 refills | Status: DC | PRN

## 2021-10-25 MED ORDER — ACETAMINOPHEN 325 MG PO TABS: 650.0000 mg | ORAL_TABLET | ORAL | Status: DC | PRN

## 2021-10-25 NOTE — Progress Notes (Signed)
Spoke with Donato Schultz, CNM about pt refusing depo. Pt requesting oral contraceptive. See new orders.

## 2021-10-25 NOTE — Progress Notes (Signed)
Patient has requested to speak with a Provider about Discharge.  Patient stated that she "did not want to leave until tomorrow when my baby leaves."  Provider has already gone over discharge instructions with patient this am, and patient verbalized understanding. This RN stated that she would notify the provider that she wanted to speak with her.

## 2021-10-25 NOTE — Progress Notes (Signed)
Patient is requesting to stay one more night. Advised she is medically stable so is being discharged home. She verbalized understanding.  Patient is refusing Depo shot. States she will take a contraceptive pill. Micronor sent to her pharmacy.  Discharge home in stable condition.  Janyce Llanos, CNM 10/25/2021 9:48 PM

## 2021-10-25 NOTE — Progress Notes (Signed)
Patient ambulated to [facility] to visit infant.

## 2021-10-25 NOTE — Progress Notes (Signed)
Discharge instructions reviewed with patient. Questions and concerns answered. Appointments and prescriptions discussed and understood. Transportation contacted by this RN to verify discharge. All belongings sent with patient. Patient wheeled down by nurse tech to ED waiting to room to await transportation.

## 2021-10-26 DIAGNOSIS — A599 Trichomoniasis, unspecified: Secondary | ICD-10-CM | POA: Insufficient documentation

## 2021-10-26 DIAGNOSIS — B9689 Other specified bacterial agents as the cause of diseases classified elsewhere: Secondary | ICD-10-CM | POA: Insufficient documentation

## 2021-10-30 ENCOUNTER — Ambulatory Visit
Admission: RE | Admit: 2021-10-30 | Discharge: 2021-10-30 | Disposition: A | Discharge status: Home / Self Care | Payer: Medicaid Other | Source: Ambulatory Visit | Attending: Certified Nurse Midwife | Admitting: Certified Nurse Midwife

## 2021-11-13 ENCOUNTER — Ambulatory Visit (INDEPENDENT_AMBULATORY_CARE_PROVIDER_SITE_OTHER): Payer: Medicaid Other

## 2021-11-13 ENCOUNTER — Ambulatory Visit: Payer: Medicaid Other | Admitting: Podiatry

## 2021-11-13 DIAGNOSIS — Z01818 Encounter for other preprocedural examination: Secondary | ICD-10-CM

## 2021-11-13 DIAGNOSIS — M19071 Primary osteoarthritis, right ankle and foot: Secondary | ICD-10-CM

## 2021-11-13 DIAGNOSIS — M19079 Primary osteoarthritis, unspecified ankle and foot: Secondary | ICD-10-CM | POA: Diagnosis not present

## 2021-11-13 MED ORDER — OXYCODONE-ACETAMINOPHEN 5-325 MG PO TABS: 1.0000 | ORAL_TABLET | ORAL | 0 refills | Status: DC | PRN

## 2021-11-20 NOTE — Progress Notes (Signed)
Subjective:  Patient ID: Madison Singleton, female    DOB: 08-18-1990,  MRN: [id number]  Chief Complaint  Patient presents with   Foot Pain    31 y.o. female presents with the above complaint.  Patient presents with continuous right ankle pain.  Patient states that it started become arthritic again she was doing pretty good for few months but it started to have pain again.  The injection boot immobilization all have failed.  She would like to revisit surgical options.  She states she is doing better with her pain management as well.  She would like to discuss treatment options for this.  Hurts with ambulation.  Hurts with pressure.  Pain scale 7 out of 10.  She has had history of ankle fracture with prior ankle arthroscopy that was done by me.   Review of Systems: Negative except as noted in the HPI. Denies N/V/F/Ch.  Past Medical History:  Diagnosis Date   Anxiety    NO MEDS   Arthritis of ankle    Breast mass    Breast tenderness in female    3;00 x3 weeks   Chronic pain syndrome    Depression    NO MEDS   GERD (gastroesophageal reflux disease)    OCC-NO MEDS   Headache    HAS RECENTLY STARTED HAVING HA'S MORE FREQUENTLY   Obesity    OSA (obstructive sleep apnea)     Current Outpatient Medications:    oxyCODONE-acetaminophen (PERCOCET) 5-325 MG tablet, Take 1 tablet by mouth every 4 (four) hours as needed for severe pain., Disp: 30 tablet, Rfl: 0   acetaminophen (TYLENOL) 325 MG tablet, Take 2 tablets (650 mg total) by mouth every 4 (four) hours as needed (for pain scale < 4)., Disp: , Rfl:    benzocaine-Menthol (DERMOPLAST) 20-0.5 % AERO, Apply 1 Application topically as needed for irritation (perineal discomfort)., Disp: , Rfl:    ferrous sulfate 325 (65 FE) MG tablet, Take 1 tablet (325 mg total) by mouth 2 (two) times daily with a meal., Disp: , Rfl: 3   hydrOXYzine (ATARAX) 50 MG tablet, Take 1 tablet (50 mg total) by mouth every 4 (four) hours as needed for anxiety., Disp:  30 tablet, Rfl: 0   ibuprofen (ADVIL) 600 MG tablet, Take 1 tablet (600 mg total) by mouth every 6 (six) hours., Disp: 30 tablet, Rfl: 0   norethindrone (ORTHO MICRONOR) 0.35 MG tablet, Take 1 tablet (0.35 mg total) by mouth daily., Disp: 84 tablet, Rfl: 3   Prenatal Vit-Fe Fumarate-FA (PRENATAL PO), Take 1 tablet by mouth daily., Disp: , Rfl:    witch hazel-glycerin (TUCKS) pad, Apply 1 Application topically as needed for hemorrhoids., Disp: 40 each, Rfl: 12  Social History   Tobacco Use  Smoking Status Every Day   Packs/day: 0.10   Years: 14.00   Total pack years: 1.40   Types: Cigarettes  Smokeless Tobacco Never    Allergies  Allergen Reactions   Haldol [Haloperidol Lactate]     Throat swelling   Amoxicillin Rash    Did it involve swelling of the face/tongue/throat, SOB, or low BP? Unknown Did it involve sudden or severe rash/hives, skin peeling, or any reaction on the inside of your mouth or nose? Yes Did you need to seek medical attention at a hospital or doctor's office? Yes When did it last happen? Childhood reaction. If all above answers are "NO", may proceed with cephalosporin use.    Objective:  There were no vitals filed for this  visit. There is no height or weight on file to calculate BMI. Constitutional Well developed. Well nourished.  Vascular Dorsalis pedis pulses palpable bilaterally. Posterior tibial pulses palpable bilaterally. Capillary refill normal to all digits.  No cyanosis or clubbing noted. Pedal hair growth normal.  Neurologic Normal speech. Oriented to person, place, and time. Epicritic sensation to light touch grossly present bilaterally.  Dermatologic Nails well groomed and normal in appearance. No open wounds. No skin lesions.  Orthopedic: Pain on palpation of right ankle pain with range of motion ankle joint mild crepitus clinically appreciated.  Deep intra-articular ankle pain noted.  No pain at the subtalar joint.   Radiographs: 3 views  of skeletally mature adult right ankle.  Osteochondral lesion is appreciated on the medial talar dome osteoarthritic changes noted at the ankle joint.  Syndesmosis fusion also noted.  This is likely due to previous ankle fracture Assessment:   1. Arthritis of ankle   2. Encounter for preoperative examination for general surgical procedure    Plan:  Patient was evaluated and treated and all questions answered.  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  At this time given her age she will benefit from another ankle arthroscopy with micro drilling of the osteochondral lesion.  I will again evaluate the amount of arthritis that is that is present.  Ultimately patient will need an ankle fusion/ankle implant however given her age and history of chronic pain.  We will hold off on more aggressive option for now.  We will plan on doing an ankle arthroscopy to buy time.  I discussed with patient she states understand like to proceed with ankle arthroscopy with intervention -She can be weightbearing as tolerated cam boot after the surgery -Informed surgical risk consent was reviewed and read aloud to the patient.  I reviewed the films.  I have discussed my findings with the patient in great detail.  I have discussed all risks including but not limited to infection, stiffness, scarring, limp, disability, deformity, damage to blood vessels and nerves, numbness, poor healing, need for braces, arthritis, chronic pain, amputation, death.  All benefits and realistic expectations discussed in great detail.  I have made no promises as to the outcome.  I have provided realistic expectations.  I have offered the patient a 2nd opinion, which they have declined and assured me they preferred to proceed despite the risks   No follow-ups on file.

## 2021-11-30 ENCOUNTER — Telehealth: Payer: Self-pay | Admitting: Urology

## 2021-11-30 NOTE — Telephone Encounter (Signed)
DOS - 12/24/21  ANKLE ARTHROPLASTY RIGHT --- [id number]  [employer] EFFECTIVE DATE - 09/22/21  RECEIVED A FAX FROM [employer] STATING I NEED TO CALL, THE [facility] IS OUT OF NETWORK., I CALLED AND SPOKE WITH AMY AND TOLD HER THAT [facility] IS IN NETWORK. SHE CALLED AND SPOKE WITH [employer] WHILE I WAS ON HOLD. SHE STATED THAT SINCE [facility] AND DR. PATEL ARE IN NETWORK. FOR CPT CODE 16384 NO PRIOR AUTH REQUIRED. THEY CANCELED THE CASE ON [employer]. REF # [id number] AND TRACKING ID # [id number].  REF # [id number] (CALL WITH AMY)

## 2021-12-24 ENCOUNTER — Other Ambulatory Visit: Payer: Self-pay | Admitting: Podiatry

## 2021-12-24 ENCOUNTER — Telehealth: Payer: Self-pay

## 2021-12-24 MED ORDER — IBUPROFEN 800 MG PO TABS: 800.0000 mg | ORAL_TABLET | Freq: Four times a day (QID) | ORAL | 1 refills | Status: DC | PRN

## 2021-12-24 MED ORDER — OXYCODONE-ACETAMINOPHEN 5-325 MG PO TABS: 1.0000 | ORAL_TABLET | ORAL | 0 refills | Status: DC | PRN

## 2021-12-24 NOTE — Telephone Encounter (Signed)
Madison Singleton was scheduled for surgery on 12/24/2021. Her brother called and stated she is in jail and will call back to reschedule once she gets out. Notified Dr. Posey Pronto and [facility]

## 2021-12-28 ENCOUNTER — Telehealth: Payer: Self-pay | Admitting: Podiatry

## 2021-12-28 NOTE — Telephone Encounter (Signed)
Refill request  oxyCODONE-acetaminophen (PERCOCET) 5-325 MG tablet  [facility] [place] [address]  [address] [place]) [place] [address]  Phone:  [phone]  Fax:  [phone]   Please advise

## 2021-12-28 NOTE — Telephone Encounter (Signed)
Pt called again inquiring about the Rx for pain. Please advise.

## 2021-12-31 NOTE — Telephone Encounter (Signed)
Please advise 

## 2022-01-01 ENCOUNTER — Encounter: Payer: Medicaid Other | Admitting: Podiatry

## 2022-01-02 ENCOUNTER — Telehealth: Payer: Self-pay | Admitting: Podiatry

## 2022-01-02 NOTE — Telephone Encounter (Signed)
Pt called in to request a Rx refill on pain medicine, advised pt that at this time Dr. Posey Pronto would not administer a Rx. Pt's response was "okay".

## 2022-01-10 ENCOUNTER — Telehealth: Payer: Self-pay | Admitting: Podiatry

## 2022-01-10 NOTE — Telephone Encounter (Signed)
Pt called in to request a Rx refill for pain. Pt stated that she has tried taking OTC pain medicine & it is not working, stated that she has returned back to work. Please advise.

## 2022-01-10 NOTE — Telephone Encounter (Signed)
Called patient, left voice message for call back

## 2022-01-11 MED ORDER — OXYCODONE-ACETAMINOPHEN 5-325 MG PO TABS: 1.0000 | ORAL_TABLET | ORAL | 0 refills | Status: DC | PRN

## 2022-01-11 NOTE — Telephone Encounter (Signed)
Pt called to check on status of refill. I advised that her refill was sent this morning to pharmacy in chart. She is aware.

## 2022-01-14 ENCOUNTER — Telehealth: Payer: Self-pay | Admitting: Podiatry

## 2022-01-14 ENCOUNTER — Encounter: Payer: Self-pay | Admitting: Podiatry

## 2022-01-14 NOTE — Telephone Encounter (Signed)
Pt called stating she was to have surgery this morning but her husbands car would not start, she called the surgery center to get it moved to later today and they told her to call us. Transferred call to [facility]. Surgery coordinator.

## 2022-01-15 ENCOUNTER — Encounter: Payer: Medicaid Other | Admitting: Podiatry

## 2022-01-15 IMAGING — CT CT HEAD W/O CM
3 series · 16 of 47 positions shown, 19 images · non-contrast
Comparison: August 12, 2020.

CLINICAL DATA: Posttraumatic headache after head injury.

EXAM:
CT HEAD WITHOUT CONTRAST
TECHNIQUE: Contiguous axial images were obtained from the base of the skull
through the vertex without intravenous contrast.

[Series 2: head wo · axial · 0.42mm/px · z∈[-114,+11]mm · 10 of 31 slices shown, 13 images]
[im 3/31  brain]
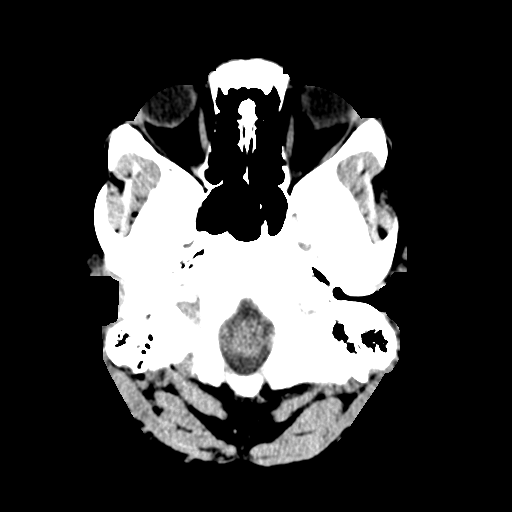
[im 3/31  bone]
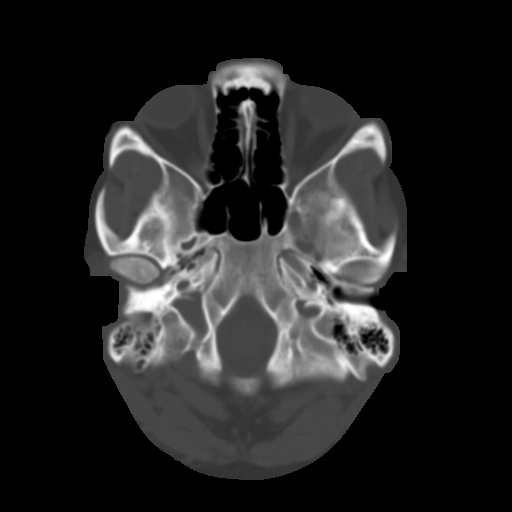
[im 6/31  brain]
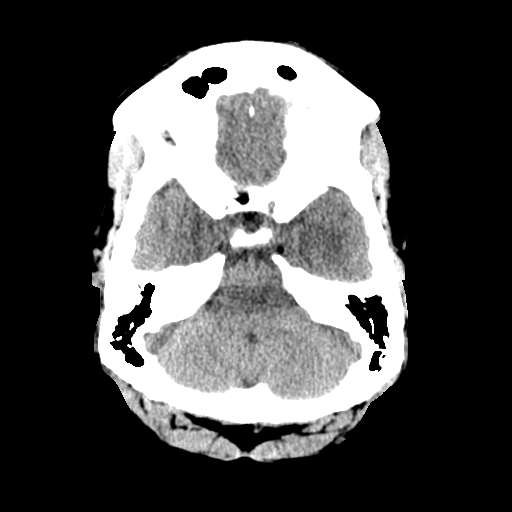
[im 9/31  brain]
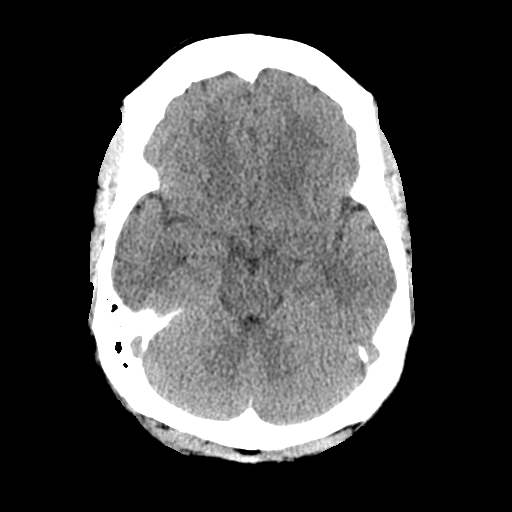
[im 11/31  brain]
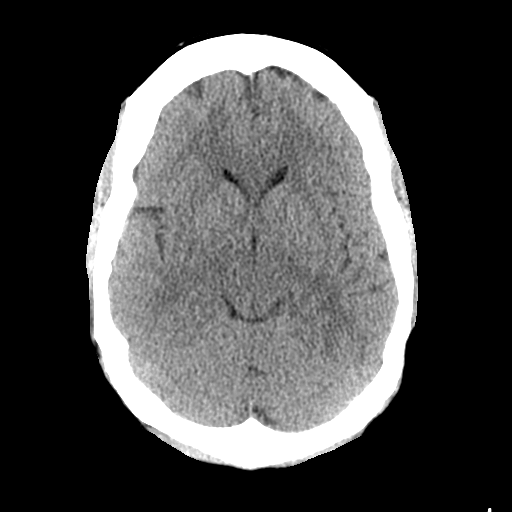
[im 14/31  brain]
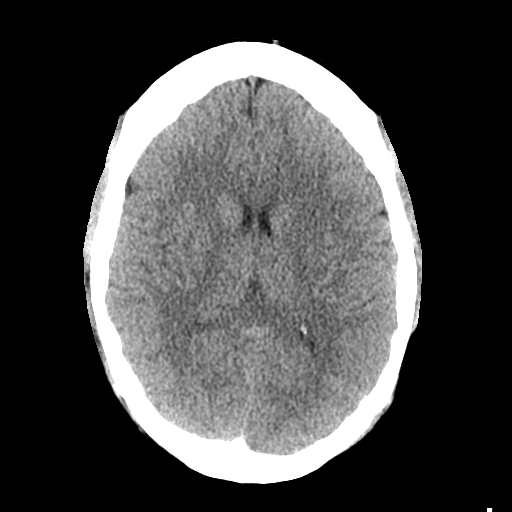
[im 14/31  bone]
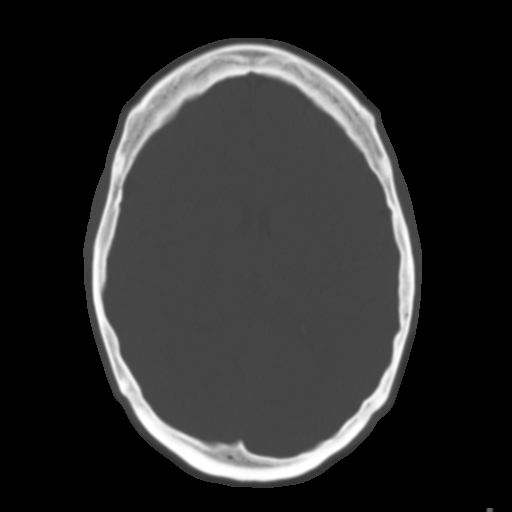
[im 17/31  brain]
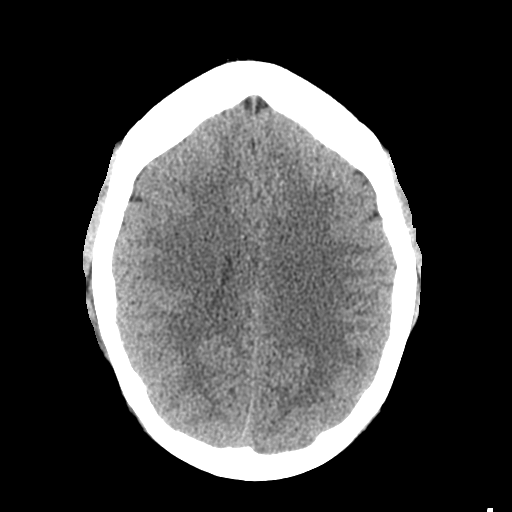
[im 20/31  brain]
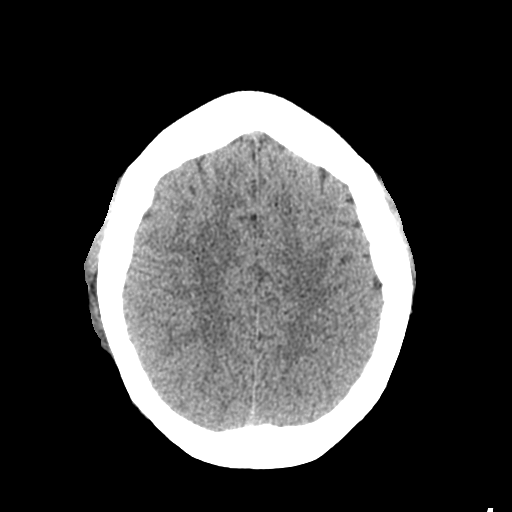
[im 23/31  brain]
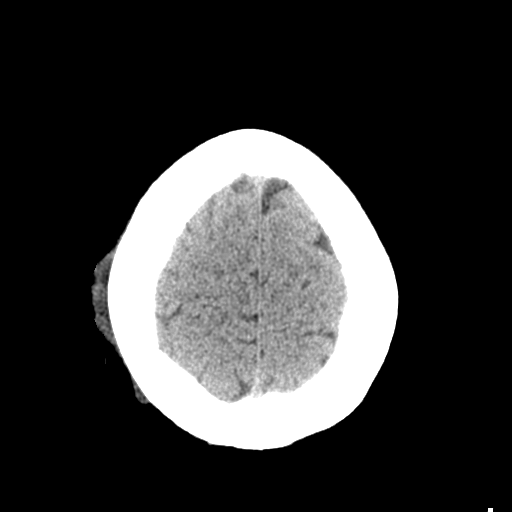
[im 25/31  brain]
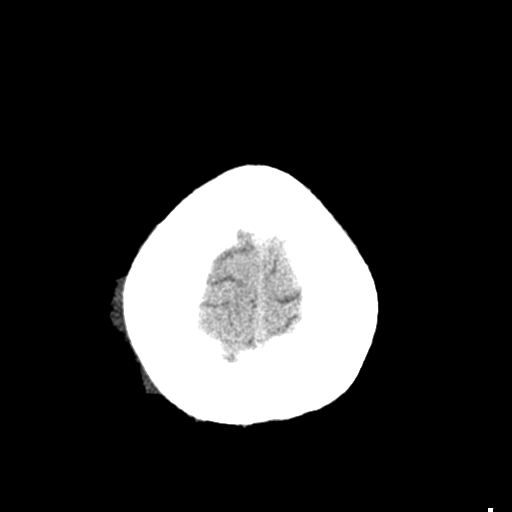
[im 25/31  bone]
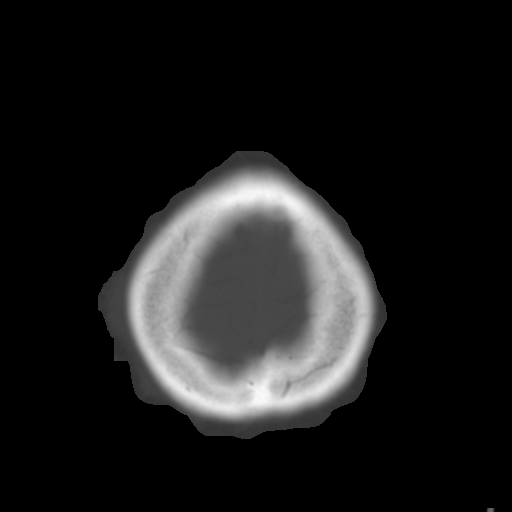
[im 28/31  brain]
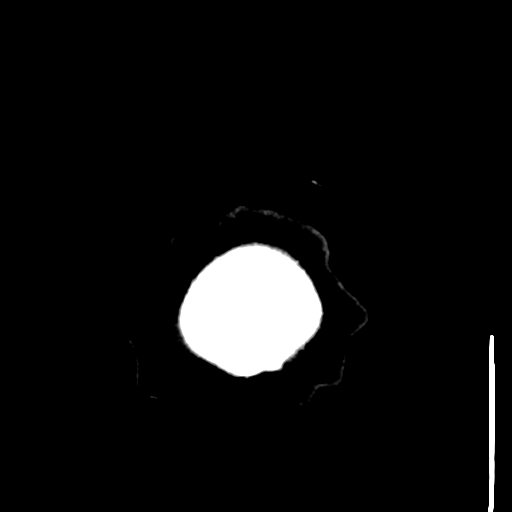

[Series 4: coronal soft tissue · coronal · 0.31mm/px · 3 of 67 slices shown]
[im 23/67  brain]
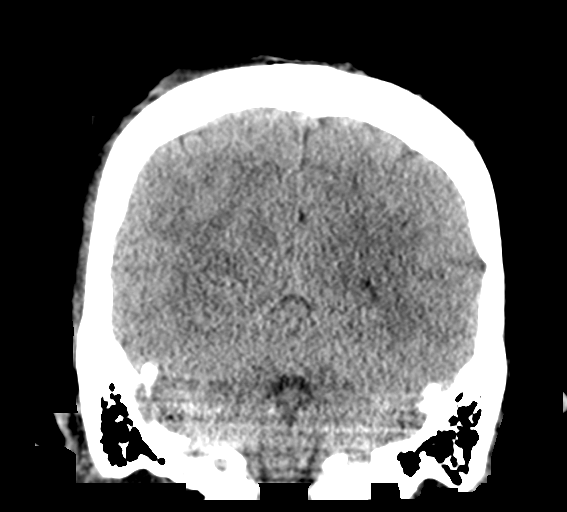
[im 30/67  brain]
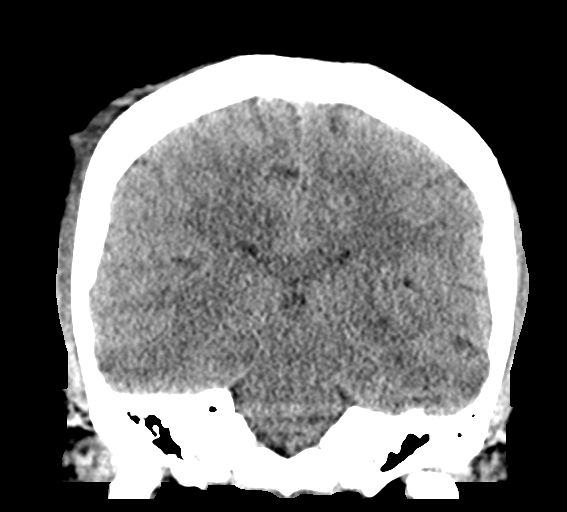
[im 37/67  brain]
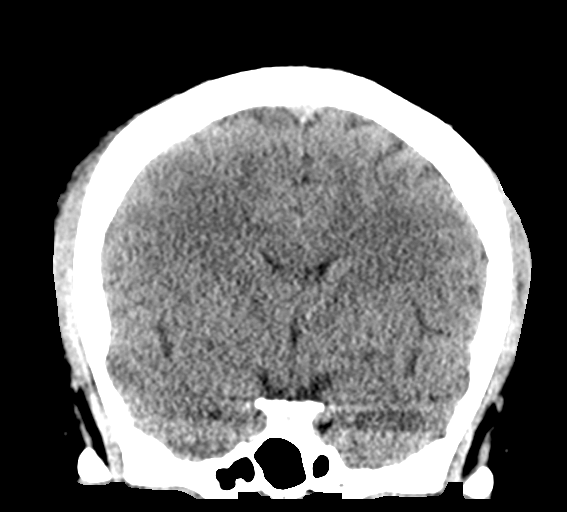

[Series 5: sagittal soft tissue · sagittal · 0.31mm/px · 3 of 55 slices shown]
[im 19/55  brain]
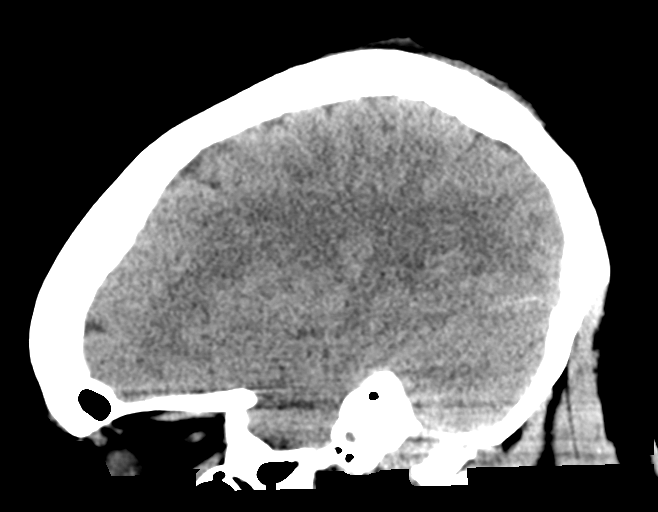
[im 28/55  brain]
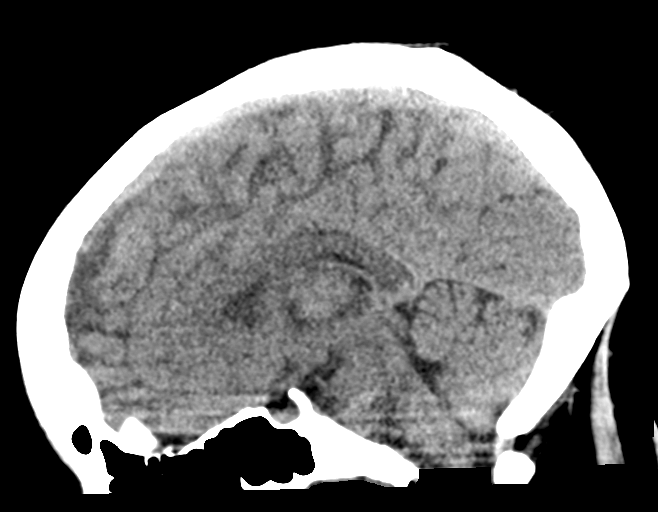
[im 37/55  brain]
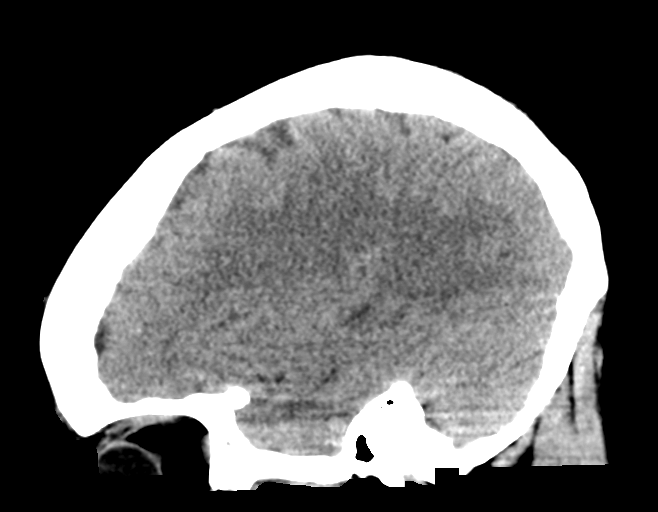

[16 of 47 positions shown; findings below may reference images not displayed]

FINDINGS: Brain: No evidence of acute infarction, hemorrhage, hydrocephalus,
extra-axial collection or mass lesion/mass effect.

Vascular: No hyperdense vessel or unexpected calcification.

Skull: Normal. Negative for fracture or focal lesion.

Sinuses/Orbits: No acute finding.

Other: Small right parietal scalp hematoma is noted.
IMPRESSION: Small right parietal scalp hematoma. No acute intracranial
abnormality seen.

## 2022-01-21 ENCOUNTER — Telehealth: Payer: Self-pay

## 2022-01-21 NOTE — Telephone Encounter (Signed)
Madison Singleton called to schedule her surgery with Dr. Posey Pronto. She was scheduled today for surgery but stated she misunderstood the time she was supposed to be there. This is her 3rd no show for surgery. Per Dr. Posey Pronto she is not to be rescheduled for surgery. Madison Singleton is aware that we will not be rescheduling her surgery.

## 2022-01-22 ENCOUNTER — Encounter: Payer: Medicaid Other | Admitting: Podiatry

## 2022-01-29 ENCOUNTER — Encounter: Payer: Medicaid Other | Admitting: Podiatry

## 2022-01-31 ENCOUNTER — Other Ambulatory Visit: Payer: Self-pay

## 2022-01-31 ENCOUNTER — Emergency Department
Admission: EM | Admit: 2022-01-31 | Discharge: 2022-01-31 | Disposition: A | Discharge status: Home / Self Care | Payer: Medicaid Other | Attending: Emergency Medicine | Admitting: Emergency Medicine

## 2022-01-31 ENCOUNTER — Encounter: Payer: Self-pay | Admitting: Emergency Medicine

## 2022-01-31 ENCOUNTER — Emergency Department: Payer: Medicaid Other

## 2022-01-31 DIAGNOSIS — R519 Headache, unspecified: Secondary | ICD-10-CM | POA: Insufficient documentation

## 2022-01-31 DIAGNOSIS — G43909 Migraine, unspecified, not intractable, without status migrainosus: Secondary | ICD-10-CM

## 2022-01-31 LAB — CBC WITH DIFFERENTIAL/PLATELET
Abs Immature Granulocytes: 0.01 10*3/uL (ref 0.00–0.07)
Basophils Absolute: 0 10*3/uL (ref 0.0–0.1)
Basophils Relative: 0 %
Eosinophils Absolute: 0.1 10*3/uL (ref 0.0–0.5)
Eosinophils Relative: 1 %
HCT: 40.1 % (ref 36.0–46.0)
Hemoglobin: 12.7 g/dL (ref 12.0–15.0)
Immature Granulocytes: 0 %
Lymphocytes Relative: 39 %
Lymphs Abs: 1.8 10*3/uL (ref 0.7–4.0)
MCH: 25.2 pg — ABNORMAL LOW (ref 26.0–34.0)
MCHC: 31.7 g/dL (ref 30.0–36.0)
MCV: 79.7 fL — ABNORMAL LOW (ref 80.0–100.0)
Monocytes Absolute: 0.3 10*3/uL (ref 0.1–1.0)
Monocytes Relative: 7 %
Neutro Abs: 2.4 10*3/uL (ref 1.7–7.7)
Neutrophils Relative %: 53 %
Platelets: 386 10*3/uL (ref 150–400)
RBC: 5.03 MIL/uL (ref 3.87–5.11)
RDW: 17.5 % — ABNORMAL HIGH (ref 11.5–15.5)
WBC: 4.6 10*3/uL (ref 4.0–10.5)
nRBC: 0 % (ref 0.0–0.2)

## 2022-01-31 LAB — BASIC METABOLIC PANEL
Anion gap: 8 (ref 5–15)
BUN: 15 mg/dL (ref 6–20)
CO2: 25 mmol/L (ref 22–32)
Calcium: 9.1 mg/dL (ref 8.9–10.3)
Chloride: 104 mmol/L (ref 98–111)
Creatinine, Ser: 0.81 mg/dL (ref 0.44–1.00)
GFR, Estimated: >60 mL/min (ref >60)
Glucose, Bld: 119 mg/dL — ABNORMAL HIGH (ref 70–99)
Potassium: 4.1 mmol/L (ref 3.5–5.1)
Sodium: 137 mmol/L (ref 135–145)

## 2022-01-31 MED ORDER — BUTALBITAL-APAP-CAFFEINE 50-325-40 MG PO TABS: 1.0000 | ORAL_TABLET | Freq: Four times a day (QID) | ORAL | 0 refills | Status: DC | PRN

## 2022-01-31 MED ORDER — BUTALBITAL-APAP-CAFFEINE 50-325-40 MG PO TABS
2.0000 | ORAL_TABLET | Freq: Once | ORAL | Status: AC
  Administered 2022-01-31: 2 via ORAL
  Filled 2022-01-31: qty 2

## 2022-01-31 NOTE — ED Provider Notes (Addendum)
[facility] Provider Note    Event Date/Time   First MD Initiated Contact with Patient 01/31/22 [phone]     (approximate)  History   Chief Complaint: Headache  HPI  Madison Singleton is a 31 y.o. female with a past medical history of substance abuse, schizophrenia, presents to the emergency department for headache.  According to the patient for the past 3 weeks or so she has been experiencing an intermittent headache.  Patient states a history of headaches but states they have never lasted this long.  Patient states she was supposed to follow-up with a neurologist for her headaches but she never did.  Patient also states she is schizophrenic and supposed to be on medication but stopped taking these medications.  States on occasion she will hear voices but states that is fairly baseline for her.  No acute concerning psychiatric complaints.  Patient denies any weakness or numbness of any arm or leg.  Physical Exam   Triage Vital Signs: ED Triage Vitals  Enc Vitals Group     BP 01/31/22 0802 (!) 96/59     Pulse Rate 01/31/22 0802 78     Resp 01/31/22 0802 18     Temp 01/31/22 0802 98.7 F (37.1 C)     Temp src --      SpO2 01/31/22 0802 99 %     Weight 01/31/22 0803 152 lb (68.9 kg)     Height 01/31/22 0803 5\' 1"  (1.549 m)     Head Circumference --      Peak Flow --      Pain Score 01/31/22 0802 0     Pain Loc --      Pain Edu? --      Excl. in GC? --     Most recent vital signs: Vitals:   01/31/22 0802  BP: (!) 96/59  Pulse: 78  Resp: 18  Temp: 98.7 F (37.1 C)  SpO2: 99%    General: Awake, no distress.  CV:  Good peripheral perfusion.  Regular rate and rhythm  Resp:  Normal effort.  Equal breath sounds bilaterally.  Abd:  No distention.  Soft, nontender.  No rebound or guarding. Other:  Patient has 5/5 motor in all extremities with equal grip strength and no pronator drift.  Cranial nerves intact.   ED Results / Procedures / Treatments    RADIOLOGY  I have reviewed and interpreted the CT head images.  No acute findings on my evaluation. CT is essentially negative for acute abnormality.   MEDICATIONS ORDERED IN ED: Medications  butalbital-acetaminophen-caffeine (FIORICET) 50-325-40 MG per tablet 2 tablet (2 tablets Oral Given 01/31/22 0909)     IMPRESSION / MDM / ASSESSMENT AND PLAN / ED COURSE  I reviewed the triage vital signs and the nursing notes.  Patient's presentation is most consistent with acute presentation with potential threat to life or bodily function.  Patient presents emergency department for 3 weeks of intermittent headache.  We will dose Fioricet in the emergency department.  We will check labs and obtain a CT scan of the head as a precaution.  Patient agreeable to plan of care.  Overall the patient appears well.  I did discuss with the patient the importance of following up with neurology for her continued headaches.  Patient agreeable to this plan.  I also discussed the patient should follow-up with [facility] for her ongoing psychiatric care.  Patient also agreeable.  Given the patient's reassuring work-up with reassuring physical exam,  reassuring CT scan and a normal CBC and chemistry I believe the patient is safe for discharge home with outpatient follow-up.  The patient is agreeable to this plan.  Patient does not breast-feed.  We will prescribe Fioricet.   FINAL CLINICAL IMPRESSION(S) / ED DIAGNOSES   Headache  Rx / DC Orders   Fioricet Neurology follow-up  Note:  This document was prepared using Dragon voice recognition software and may include unintentional dictation errors.   Minna Antis, MD 01/31/22 2703    Minna Antis, MD 01/31/22 [phone]

## 2022-01-31 NOTE — ED Notes (Signed)
Dr. Scotty Court notified of pt, no new orders obtained besides triage standing blood work orders.

## 2022-01-31 NOTE — Discharge Instructions (Signed)
You may take your prescribed Fioricet as needed for headache, as prescribed.  Do not drink alcohol or drive while taking this medication.  Do not breast-feed while taking this medication. Please call the number provided for neurology to arrange a follow-up appointment for further evaluation of your ongoing headaches.  Please also follow-up with [facility] for ongoing psychiatric care.  Return to the emergency department for any worsening headache, any weakness or numbness of any arm or leg, or any other symptom personally concerning to yourself.

## 2022-01-31 NOTE — ED Triage Notes (Addendum)
Pt states coming in for a headache and left brain numbness. Pt reports symptoms started over a month ago and was told to go to a neurologist, but never went. Pt states the pain is getting worse. Pt states she sometimes hears voice. Pt denies SI/HI. Pt walking with no deficits, and on phone texting with both hands.  No slurred speech or facial droop noted.

## 2022-02-05 ENCOUNTER — Encounter: Payer: Medicaid Other | Admitting: Podiatry

## 2022-02-11 ENCOUNTER — Telehealth: Payer: Self-pay | Admitting: Podiatry

## 2022-02-11 NOTE — Telephone Encounter (Signed)
Pt stated she's in terrible pain; her ankle is swollen & she can't apply any pressure to her R foot. She has appt sched for 12/5; states she would like a Rx for pain. Please advise.

## 2022-02-11 NOTE — Telephone Encounter (Signed)
Patient called back again, she stated its swollen.  Please advise

## 2022-02-12 ENCOUNTER — Encounter: Payer: Medicaid Other | Admitting: Podiatry

## 2022-02-12 MED ORDER — OXYCODONE-ACETAMINOPHEN 5-325 MG PO TABS: 1.0000 | ORAL_TABLET | ORAL | 0 refills | Status: DC | PRN

## 2022-02-12 NOTE — Addendum Note (Signed)
Addended by: Nicholes Rough on: 02/12/2022 04:14 AM   Modules accepted: Orders

## 2022-02-13 NOTE — Telephone Encounter (Signed)
Patient has been updated.

## 2022-02-22 ENCOUNTER — Telehealth: Payer: Self-pay | Admitting: Podiatry

## 2022-02-22 NOTE — Telephone Encounter (Signed)
Pt called to request a Rx refill on pain medicine. Please advise.

## 2022-02-26 ENCOUNTER — Ambulatory Visit: Payer: Medicaid Other | Admitting: Podiatry

## 2022-02-26 DIAGNOSIS — M19079 Primary osteoarthritis, unspecified ankle and foot: Secondary | ICD-10-CM

## 2022-02-26 DIAGNOSIS — M216X1 Other acquired deformities of right foot: Secondary | ICD-10-CM | POA: Diagnosis not present

## 2022-02-26 DIAGNOSIS — Z01818 Encounter for other preprocedural examination: Secondary | ICD-10-CM

## 2022-02-26 MED ORDER — OXYCODONE-ACETAMINOPHEN 5-325 MG PO TABS: 1.0000 | ORAL_TABLET | ORAL | 0 refills | Status: DC | PRN

## 2022-02-26 NOTE — Progress Notes (Signed)
Subjective:  Patient ID: Madison Singleton, female    DOB: 1990-07-21,  MRN: [id number]  Chief Complaint  Patient presents with   Foot Pain    31 y.o. female presents with the above complaint.  Patient presents with follow-up of right continuous ankle pain.  She missed most of her surgery dates.  She would like to redo the ankle arthroscopy.  She denies any other acute complaint she is here to go and reschedule her surgery   Review of Systems: Negative except as noted in the HPI. Denies N/V/F/Ch.  Past Medical History:  Diagnosis Date   Anxiety    NO MEDS   Arthritis of ankle    Breast mass    Breast tenderness in female    3;00 x3 weeks   Chronic pain syndrome    Depression    NO MEDS   GERD (gastroesophageal reflux disease)    OCC-NO MEDS   Headache    HAS RECENTLY STARTED HAVING HA'S MORE FREQUENTLY   Obesity    OSA (obstructive sleep apnea)     Current Outpatient Medications:    oxyCODONE-acetaminophen (PERCOCET) 5-325 MG tablet, Take 1 tablet by mouth every 4 (four) hours as needed for severe pain., Disp: 30 tablet, Rfl: 0   acetaminophen (TYLENOL) 325 MG tablet, Take 2 tablets (650 mg total) by mouth every 4 (four) hours as needed (for pain scale < 4)., Disp: , Rfl:    benzocaine-Menthol (DERMOPLAST) 20-0.5 % AERO, Apply 1 Application topically as needed for irritation (perineal discomfort)., Disp: , Rfl:    butalbital-acetaminophen-caffeine (FIORICET) 50-325-40 MG tablet, Take 1-2 tablets by mouth every 6 (six) hours as needed for headache., Disp: 20 tablet, Rfl: 0   ferrous sulfate 325 (65 FE) MG tablet, Take 1 tablet (325 mg total) by mouth 2 (two) times daily with a meal., Disp: , Rfl: 3   hydrOXYzine (ATARAX) 50 MG tablet, Take 1 tablet (50 mg total) by mouth every 4 (four) hours as needed for anxiety., Disp: 30 tablet, Rfl: 0   ibuprofen (ADVIL) 600 MG tablet, Take 1 tablet (600 mg total) by mouth every 6 (six) hours., Disp: 30 tablet, Rfl: 0   ibuprofen (ADVIL) 800  MG tablet, Take 1 tablet (800 mg total) by mouth every 6 (six) hours as needed., Disp: 60 tablet, Rfl: 1   norethindrone (ORTHO MICRONOR) 0.35 MG tablet, Take 1 tablet (0.35 mg total) by mouth daily., Disp: 84 tablet, Rfl: 3   oxyCODONE-acetaminophen (PERCOCET) 5-325 MG tablet, Take 1 tablet by mouth every 4 (four) hours as needed for severe pain., Disp: 30 tablet, Rfl: 0   oxyCODONE-acetaminophen (PERCOCET) 5-325 MG tablet, Take 1 tablet by mouth every 4 (four) hours as needed for severe pain., Disp: 30 tablet, Rfl: 0   oxyCODONE-acetaminophen (PERCOCET) 5-325 MG tablet, Take 1 tablet by mouth every 4 (four) hours as needed for severe pain., Disp: 30 tablet, Rfl: 0   oxyCODONE-acetaminophen (PERCOCET) 5-325 MG tablet, Take 1 tablet by mouth every 4 (four) hours as needed for severe pain., Disp: 30 tablet, Rfl: 0   Prenatal Vit-Fe Fumarate-FA (PRENATAL PO), Take 1 tablet by mouth daily., Disp: , Rfl:    witch hazel-glycerin (TUCKS) pad, Apply 1 Application topically as needed for hemorrhoids., Disp: 40 each, Rfl: 12  Social History   Tobacco Use  Smoking Status Every Day   Packs/day: 0.10   Years: 14.00   Total pack years: 1.40   Types: Cigarettes  Smokeless Tobacco Never    Allergies  Allergen Reactions  Haldol [Haloperidol Lactate]     Throat swelling   Amoxicillin Rash    Did it involve swelling of the face/tongue/throat, SOB, or low BP? Unknown Did it involve sudden or severe rash/hives, skin peeling, or any reaction on the inside of your mouth or nose? Yes Did you need to seek medical attention at a hospital or doctor's office? Yes When did it last happen? Childhood reaction. If all above answers are "NO", may proceed with cephalosporin use.    Objective:  There were no vitals filed for this visit. There is no height or weight on file to calculate BMI. Constitutional Well developed. Well nourished.  Vascular Dorsalis pedis pulses palpable bilaterally. Posterior tibial  pulses palpable bilaterally. Capillary refill normal to all digits.  No cyanosis or clubbing noted. Pedal hair growth normal.  Neurologic Normal speech. Oriented to person, place, and time. Epicritic sensation to light touch grossly present bilaterally.  Dermatologic Nails well groomed and normal in appearance. No open wounds. No skin lesions.  Orthopedic: Pain on palpation of right ankle pain with range of motion ankle joint mild crepitus clinically appreciated.  Deep intra-articular ankle pain noted.  No pain at the subtalar joint.   Radiographs: 3 views of skeletally mature adult right ankle.  Osteochondral lesion is appreciated on the medial talar dome osteoarthritic changes noted at the ankle joint.  Syndesmosis fusion also noted.  This is likely due to previous ankle fracture Assessment:   No diagnosis found.  Plan:  Patient was evaluated and treated and all questions answered.  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  At this time given her age she will benefit from another ankle arthroscopy with micro drilling of the osteochondral lesion.  I will again evaluate the amount of arthritis that is that is present.  Ultimately patient will need an ankle fusion/ankle implant however given her age and history of chronic pain.  We will hold off on more aggressive option for now.  We will plan on doing an ankle arthroscopy to buy time.  I discussed with patient she states understand like to proceed with ankle arthroscopy with intervention -She can be weightbearing as tolerated cam boot after the surgery -Informed surgical risk consent was reviewed and read aloud to the patient.  I reviewed the films.  I have discussed my findings with the patient in great detail.  I have discussed all risks including but not limited to infection, stiffness, scarring, limp, disability, deformity, damage to blood  vessels and nerves, numbness, poor healing, need for braces, arthritis, chronic pain, amputation, death.  All benefits and realistic expectations discussed in great detail.  I have made no promises as to the outcome.  I have provided realistic expectations.  I have offered the patient a 2nd opinion, which they have declined and assured me they preferred to proceed despite the risks   No follow-ups on file.

## 2022-02-27 ENCOUNTER — Telehealth: Payer: Self-pay

## 2022-02-27 NOTE — Telephone Encounter (Signed)
Recevied surgery paperwork from the [place] office. Left a message for [facility] to call and schedule surgery with Dr. Allena Katz.

## 2022-03-04 ENCOUNTER — Telehealth: Payer: Self-pay | Admitting: Podiatry

## 2022-03-04 NOTE — Telephone Encounter (Signed)
Pt called in to request a Rx refill on her pain medicine. Please advise 

## 2022-03-04 NOTE — Telephone Encounter (Signed)
Pt has been notified.

## 2022-03-11 ENCOUNTER — Telehealth: Payer: Self-pay | Admitting: Podiatry

## 2022-03-11 NOTE — Telephone Encounter (Signed)
Pt calling for pain medication refill to be sent in.

## 2022-03-21 ENCOUNTER — Encounter: Payer: Self-pay | Admitting: Podiatry

## 2022-03-21 ENCOUNTER — Ambulatory Visit: Payer: Medicaid Other | Admitting: Podiatry

## 2022-03-21 VITALS — BP 112/65 | HR 71

## 2022-03-21 DIAGNOSIS — M216X1 Other acquired deformities of right foot: Secondary | ICD-10-CM

## 2022-03-21 DIAGNOSIS — M19079 Primary osteoarthritis, unspecified ankle and foot: Secondary | ICD-10-CM

## 2022-03-21 MED ORDER — OXYCODONE-ACETAMINOPHEN 5-325 MG PO TABS: 1.0000 | ORAL_TABLET | ORAL | 0 refills | Status: DC | PRN

## 2022-03-21 NOTE — Progress Notes (Signed)
Subjective:  Patient ID: Madison Singleton, female    DOB: [birth year],  MRN: [id number]  Chief Complaint  Patient presents with   Follow-up    Patient is here for right ankle pain.    31 y.o. female presents with the above complaint.  Patient presents with follow-up of right continuous ankle pain.  She missed most of her surgery dates.  She would like to redo the ankle arthroscopy.  She denies any other acute complaint she is here to go and reschedule her surgery   Review of Systems: Negative except as noted in the HPI. Denies N/V/F/Ch.  Past Medical History:  Diagnosis Date   Anxiety    NO MEDS   Arthritis of ankle    Breast mass    Breast tenderness in female    3;00 x3 weeks   Chronic pain syndrome    Depression    NO MEDS   GERD (gastroesophageal reflux disease)    OCC-NO MEDS   Headache    HAS RECENTLY STARTED HAVING HA'S MORE FREQUENTLY   Obesity    OSA (obstructive sleep apnea)     Current Outpatient Medications:    acetaminophen (TYLENOL) 325 MG tablet, Take 2 tablets (650 mg total) by mouth every 4 (four) hours as needed (for pain scale < 4)., Disp: , Rfl:    benzocaine-Menthol (DERMOPLAST) 20-0.5 % AERO, Apply 1 Application topically as needed for irritation (perineal discomfort)., Disp: , Rfl:    butalbital-acetaminophen-caffeine (FIORICET) 50-325-40 MG tablet, Take 1-2 tablets by mouth every 6 (six) hours as needed for headache., Disp: 20 tablet, Rfl: 0   ferrous sulfate 325 (65 FE) MG tablet, Take 1 tablet (325 mg total) by mouth 2 (two) times daily with a meal., Disp: , Rfl: 3   hydrOXYzine (ATARAX) 50 MG tablet, Take 1 tablet (50 mg total) by mouth every 4 (four) hours as needed for anxiety., Disp: 30 tablet, Rfl: 0   ibuprofen (ADVIL) 600 MG tablet, Take 1 tablet (600 mg total) by mouth every 6 (six) hours., Disp: 30 tablet, Rfl: 0   ibuprofen (ADVIL) 800 MG tablet, Take 1 tablet (800 mg total) by mouth every 6 (six) hours as needed., Disp: 60 tablet, Rfl: 1    norethindrone (ORTHO MICRONOR) 0.35 MG tablet, Take 1 tablet (0.35 mg total) by mouth daily., Disp: 84 tablet, Rfl: 3   oxyCODONE-acetaminophen (PERCOCET) 5-325 MG tablet, Take 1 tablet by mouth every 4 (four) hours as needed for severe pain., Disp: 30 tablet, Rfl: 0   oxyCODONE-acetaminophen (PERCOCET) 5-325 MG tablet, Take 1 tablet by mouth every 4 (four) hours as needed for severe pain., Disp: 30 tablet, Rfl: 0   oxyCODONE-acetaminophen (PERCOCET) 5-325 MG tablet, Take 1 tablet by mouth every 4 (four) hours as needed for severe pain., Disp: 30 tablet, Rfl: 0   oxyCODONE-acetaminophen (PERCOCET) 5-325 MG tablet, Take 1 tablet by mouth every 4 (four) hours as needed for severe pain., Disp: 30 tablet, Rfl: 0   oxyCODONE-acetaminophen (PERCOCET) 5-325 MG tablet, Take 1 tablet by mouth every 4 (four) hours as needed for severe pain., Disp: 30 tablet, Rfl: 0   Prenatal Vit-Fe Fumarate-FA (PRENATAL PO), Take 1 tablet by mouth daily., Disp: , Rfl:    witch hazel-glycerin (TUCKS) pad, Apply 1 Application topically as needed for hemorrhoids., Disp: 40 each, Rfl: 12  Social History   Tobacco Use  Smoking Status Every Day   Packs/day: 0.10   Years: 14.00   Total pack years: 1.40   Types: Cigarettes  Smokeless  Tobacco Never    Allergies  Allergen Reactions   Haldol [Haloperidol Lactate]     Throat swelling   Amoxicillin Rash    Did it involve swelling of the face/tongue/throat, SOB, or low BP? Unknown Did it involve sudden or severe rash/hives, skin peeling, or any reaction on the inside of your mouth or nose? Yes Did you need to seek medical attention at a hospital or doctor's office? Yes When did it last happen? Childhood reaction. If all above answers are "NO", may proceed with cephalosporin use.    Objective:   Vitals:   03/21/22 0905  BP: 112/65  Pulse: 71   There is no height or weight on file to calculate BMI. Constitutional Well developed. Well nourished.  Vascular Dorsalis  pedis pulses palpable bilaterally. Posterior tibial pulses palpable bilaterally. Capillary refill normal to all digits.  No cyanosis or clubbing noted. Pedal hair growth normal.  Neurologic Normal speech. Oriented to person, place, and time. Epicritic sensation to light touch grossly present bilaterally.  Dermatologic Nails well groomed and normal in appearance. No open wounds. No skin lesions.  Orthopedic: Pain on palpation of right ankle pain with range of motion ankle joint mild crepitus clinically appreciated.  Deep intra-articular ankle pain noted.  No pain at the subtalar joint.   Radiographs: 3 views of skeletally mature adult right ankle.  Osteochondral lesion is appreciated on the medial talar dome osteoarthritic changes noted at the ankle joint.  Syndesmosis fusion also noted.  This is likely due to previous ankle fracture Assessment:   No diagnosis found.  Plan:  Patient was evaluated and treated and all questions answered.  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  At this time given her age she will benefit from another ankle arthroscopy with micro drilling of the osteochondral lesion.  I will again evaluate the amount of arthritis that is that is present.  Ultimately patient will need an ankle fusion/ankle implant however given her age and history of chronic pain.  We will hold off on more aggressive option for now.  We will plan on doing an ankle arthroscopy to buy time.  I discussed with patient she states understand like to proceed with ankle arthroscopy with intervention -She can be weightbearing as tolerated cam boot after the surgery -Informed surgical risk consent was reviewed and read aloud to the patient.  I reviewed the films.  I have discussed my findings with the patient in great detail.  I have discussed all risks including but not limited to infection, stiffness,  scarring, limp, disability, deformity, damage to blood vessels and nerves, numbness, poor healing, need for braces, arthritis, chronic pain, amputation, death.  All benefits and realistic expectations discussed in great detail.  I have made no promises as to the outcome.  I have provided realistic expectations.  I have offered the patient a 2nd opinion, which they have declined and assured me they preferred to proceed despite the risks -A steroid injection was performed at right ankle joint using 1% plain Lidocaine and 10 mg of Kenalog. This was well tolerated.  I will   No follow-ups on file.

## 2022-03-27 ENCOUNTER — Telehealth: Payer: Self-pay | Admitting: Podiatry

## 2022-03-27 NOTE — Telephone Encounter (Addendum)
DOS: 04/22/2022  [place] [employer]  Ankle Arthroscopy Rt ([id number])  Pending Authorization #: [id number]  Prior authorization is not required per fax received from [facility].

## 2022-04-02 ENCOUNTER — Ambulatory Visit
Admission: RE | Admit: 2022-04-02 | Discharge: 2022-04-02 | Disposition: A | Discharge status: Home / Self Care | Payer: Medicaid Other | Attending: Family Medicine | Admitting: Family Medicine

## 2022-04-02 ENCOUNTER — Other Ambulatory Visit: Payer: Self-pay | Admitting: Family Medicine

## 2022-04-02 ENCOUNTER — Telehealth: Payer: Self-pay | Admitting: Podiatry

## 2022-04-02 ENCOUNTER — Ambulatory Visit
Admission: RE | Admit: 2022-04-02 | Discharge: 2022-04-02 | Disposition: A | Discharge status: Home / Self Care | Payer: Medicaid Other | Source: Ambulatory Visit | Attending: Family Medicine | Admitting: Family Medicine

## 2022-04-02 ENCOUNTER — Other Ambulatory Visit: Payer: Self-pay | Admitting: Podiatry

## 2022-04-02 DIAGNOSIS — G8929 Other chronic pain: Secondary | ICD-10-CM | POA: Diagnosis present

## 2022-04-02 DIAGNOSIS — M549 Dorsalgia, unspecified: Secondary | ICD-10-CM | POA: Diagnosis present

## 2022-04-02 MED ORDER — OXYCODONE-ACETAMINOPHEN 5-325 MG PO TABS: 1.0000 | ORAL_TABLET | ORAL | 0 refills | Status: DC | PRN

## 2022-04-02 NOTE — Telephone Encounter (Signed)
Pt called and has started back to work and would like her pain medication refilled.

## 2022-04-17 ENCOUNTER — Other Ambulatory Visit: Payer: Self-pay | Admitting: Podiatry

## 2022-04-17 ENCOUNTER — Telehealth: Payer: Self-pay | Admitting: Podiatry

## 2022-04-17 NOTE — Telephone Encounter (Signed)
Patient would like a refill on pain meds  Please advise

## 2022-04-18 ENCOUNTER — Ambulatory Visit: Payer: Self-pay | Admitting: Psychiatry

## 2022-04-18 MED ORDER — OXYCODONE-ACETAMINOPHEN 5-325 MG PO TABS: 1.0000 | ORAL_TABLET | ORAL | 0 refills | Status: DC | PRN

## 2022-04-18 NOTE — Addendum Note (Signed)
Addended by: Boneta Lucks on: 04/18/2022 08:06 AM   Modules accepted: Orders

## 2022-04-19 ENCOUNTER — Telehealth: Payer: Self-pay | Admitting: Urology

## 2022-04-19 ENCOUNTER — Emergency Department
Admission: EM | Admit: 2022-04-19 | Discharge: 2022-04-19 | Disposition: A | Discharge status: Home / Self Care | Payer: Medicaid Other | Attending: Emergency Medicine | Admitting: Emergency Medicine

## 2022-04-19 ENCOUNTER — Encounter: Payer: Self-pay | Admitting: Podiatry

## 2022-04-19 ENCOUNTER — Other Ambulatory Visit: Payer: Self-pay

## 2022-04-19 DIAGNOSIS — R059 Cough, unspecified: Secondary | ICD-10-CM | POA: Diagnosis present

## 2022-04-19 DIAGNOSIS — U071 COVID-19: Secondary | ICD-10-CM | POA: Diagnosis not present

## 2022-04-19 LAB — RESP PANEL BY RT-PCR (RSV, FLU A&B, COVID)  RVPGX2
Influenza A by PCR: NEGATIVE
Influenza B by PCR: NEGATIVE
Resp Syncytial Virus by PCR: NEGATIVE
SARS Coronavirus 2 by RT PCR: POSITIVE — AB

## 2022-04-19 MED ORDER — ACETAMINOPHEN 325 MG PO TABS
650.0000 mg | ORAL_TABLET | Freq: Once | ORAL | Status: AC
  Administered 2022-04-19: 650 mg via ORAL
  Filled 2022-04-19: qty 2

## 2022-04-19 NOTE — Discharge Instructions (Signed)
You have COVID-19.  Continue to take Tylenol/ibuprofen per package instructions as needed for aches or fever.  Please isolate at home for 5 days unless you require medical attention, and then when your quarantine period is over you must wear a mask for next 5 days.  Please return for any new, worsening, or change in symptoms or other concerns.  It was a pleasure caring for you today.

## 2022-04-19 NOTE — ED Notes (Signed)
Pt requested RN to set up her transportation via company she uses. RN called, transportation states there is a 3hr window for pick up. Confirmation number [id number].

## 2022-04-19 NOTE — ED Notes (Signed)
Pt discharge to home. Pt VSS, GCS 15, NAD. Pt verbalized understanding of discharge instructions with no additional questions at this time.  

## 2022-04-19 NOTE — ED Triage Notes (Signed)
Pt arrived via EMS with complaint of body aches, chills, cough, and states she lost her voice. Pt denies any pertinent medical hx or fevers.

## 2022-04-19 NOTE — ED Provider Notes (Signed)
[facility] Provider Note    Event Date/Time   First MD Initiated Contact with Patient 04/19/22 [phone]     (approximate)   History   Generalized Body Aches   HPI  Madison Singleton is a 32 y.o. female who presents today for evaluation of cough, nasal congestion, sore throat, body aches since yesterday.  Patient denies any known sick contacts.  She denies abdominal pain, nausea, vomiting, diarrhea.  She denies chest pain or shortness of breath.  Patient Active Problem List   Diagnosis Date Noted   Uterine contractions 10/19/2021   Preterm uterine contractions in third trimester, antepartum 10/04/2021   Supervision of high risk pregnancy in third trimester 03/25/2018   Pelvic pressure in pregnancy, antepartum, third trimester 02/26/2018   Generalized anxiety disorder 07/11/2016   Cocaine abuse (Marty) 07/11/2016   History of depression 03/12/2013          Physical Exam   Triage Vital Signs: ED Triage Vitals  Enc Vitals Group     BP      Pulse      Resp      Temp      Temp src      SpO2      Weight      Height      Head Circumference      Peak Flow      Pain Score      Pain Loc      Pain Edu?      Excl. in [place]?     Most recent vital signs: Vitals:   04/19/22 0750  BP: 113/69  Pulse: 82  Resp: 20  Temp: 99.1 F (37.3 C)  SpO2: 99%    Physical Exam Vitals and nursing note reviewed.  Constitutional:      General: Awake and alert. No acute distress.    Appearance: Normal appearance. The patient is normal weight.  HENT:     Head: Normocephalic and atraumatic.     Mouth: Mucous membranes are moist. Uvula midline.  No tonsillar exudate.  No soft palate fluctuance.  No trismus.  No voice change.  No sublingual swelling.  No tender cervical lymphadenopathy.  No nuchal rigidity Eyes:     General: PERRL. Normal EOMs        Right eye: No discharge.        Left eye: No discharge.     Conjunctiva/sclera: Conjunctivae normal.   Cardiovascular:     Rate and Rhythm: Normal rate and regular rhythm.     Pulses: Normal pulses.     Heart sounds: Normal heart sounds Pulmonary:     Effort: Pulmonary effort is normal. No respiratory distress.     Breath sounds: Normal breath sounds.  Dry cough on exam but able to speak easily in complete sentences Abdominal:     Abdomen is soft. There is no abdominal tenderness. No rebound or guarding. No distention. Musculoskeletal:        General: No swelling. Normal range of motion.     Cervical back: Normal range of motion and neck supple.  Skin:    General: Skin is warm and dry.     Capillary Refill: Capillary refill takes less than 2 seconds.     Findings: No rash.  Neurological:     Mental Status: The patient is awake and alert.      ED Results / Procedures / Treatments   Labs (all labs ordered are listed, but only abnormal results are displayed)  Labs Reviewed  RESP PANEL BY RT-PCR (RSV, FLU A&B, COVID)  [id number] - Abnormal; Notable for the following components:      Result Value   SARS Coronavirus 2 by RT PCR POSITIVE (*)    All other components within normal limits     EKG     RADIOLOGY     PROCEDURES:  Critical Care performed:   Procedures   MEDICATIONS ORDERED IN ED: Medications  acetaminophen (TYLENOL) tablet 650 mg (650 mg Oral Given 04/19/22 0757)     IMPRESSION / MDM / ASSESSMENT AND PLAN / ED COURSE  I reviewed the triage vital signs and the nursing notes.   Differential diagnosis includes, but is not limited to, COVID, influenza, RSV, other URI.  Patient is awake and alert, hemodynamically stable and afebrile.  Lungs are clear to auscultation bilaterally.  She demonstrates no increased work of breathing.  I do not suspect pneumonia.  While waiting for swab results, she had many things to eat and drink in the emergency department per her request.  Swab is positive for COVID-19.  We discussed this diagnosis and symptomatic management.  She  was advised that she is highly contagious to others and should isolate unless she requires medical attention.  She was given a work note.  She was instructed that after 5 days when her isolation period is over she must wear a mask for an additional 5 days per CDC guidelines.  No chest pain or fever to suggest myocarditis.  We discussed return precautions and the importance of close outpatient follow-up.  Patient understands and agrees with plan.  She was discharged in stable condition.   Patient's presentation is most consistent with acute complicated illness / injury requiring diagnostic workup.       FINAL CLINICAL IMPRESSION(S) / ED DIAGNOSES   Final diagnoses:  [id number]     Rx / DC Orders   ED Discharge Orders     None        Note:  This document was prepared using Dragon voice recognition software and may include unintentional dictation errors.   Emeline Gins 04/19/22 3646    Harvest Dark, MD 04/19/22 1546

## 2022-04-19 NOTE — Telephone Encounter (Signed)
Pt called stating that she needs to reschedule her sx with Dr. Posey Pronto on 04/22/22 due to she was just discharged from the ED this morning and was diagnosed with Covid. I have rescheduled her sx to 05/27/22. I have informed Caren Griffins with [facility] and Dr. Posey Pronto of this change.

## 2022-04-24 ENCOUNTER — Telehealth (HOSPITAL_COMMUNITY): Payer: Self-pay

## 2022-04-24 ENCOUNTER — Telehealth: Payer: Self-pay

## 2022-04-24 NOTE — Telephone Encounter (Signed)
Medication management - Telephone call with patient to discuss her status with referral for medication management services and discussion from providers that patient may benefit from a higher level of care. Informed of providers recommendations and provided multiple other options for treatment services, [facility], [facility],  [facility], [facility] and other [employer] [facility] practices.  Patient stated plan to try some of these for locating a new provider.

## 2022-04-24 NOTE — Telephone Encounter (Signed)
Telephone created in error please delete

## 2022-05-01 ENCOUNTER — Other Ambulatory Visit: Payer: Self-pay | Admitting: Podiatry

## 2022-05-03 ENCOUNTER — Other Ambulatory Visit: Payer: Self-pay | Admitting: Podiatry

## 2022-05-06 ENCOUNTER — Telehealth: Payer: Self-pay | Admitting: Podiatry

## 2022-05-06 MED ORDER — OXYCODONE-ACETAMINOPHEN 5-325 MG PO TABS: 1.0000 | ORAL_TABLET | ORAL | 0 refills | Status: DC | PRN

## 2022-05-06 NOTE — Telephone Encounter (Signed)
Pt requested a refill on Rx for pain medicine. Please advise.

## 2022-05-10 ENCOUNTER — Encounter: Payer: Medicaid Other | Admitting: Podiatry

## 2022-05-22 ENCOUNTER — Other Ambulatory Visit: Payer: Self-pay | Admitting: Podiatry

## 2022-05-24 ENCOUNTER — Encounter: Payer: Medicaid Other | Admitting: Podiatry

## 2022-05-27 ENCOUNTER — Encounter: Payer: Self-pay | Admitting: Podiatry

## 2022-05-27 ENCOUNTER — Other Ambulatory Visit: Payer: Self-pay | Admitting: Podiatry

## 2022-05-27 DIAGNOSIS — M19071 Primary osteoarthritis, right ankle and foot: Secondary | ICD-10-CM

## 2022-05-27 MED ORDER — OXYCODONE-ACETAMINOPHEN 10-325 MG PO TABS: 1.0000 | ORAL_TABLET | ORAL | 0 refills | Status: DC | PRN

## 2022-05-27 MED ORDER — OXYCODONE-ACETAMINOPHEN 5-325 MG PO TABS: 1.0000 | ORAL_TABLET | ORAL | 0 refills | Status: DC | PRN

## 2022-05-29 ENCOUNTER — Encounter: Payer: Self-pay | Admitting: Podiatry

## 2022-05-31 ENCOUNTER — Other Ambulatory Visit: Payer: Self-pay | Admitting: Podiatry

## 2022-06-01 ENCOUNTER — Other Ambulatory Visit (INDEPENDENT_AMBULATORY_CARE_PROVIDER_SITE_OTHER): Payer: Medicaid Other | Admitting: Podiatry

## 2022-06-01 DIAGNOSIS — Z9889 Other specified postprocedural states: Secondary | ICD-10-CM

## 2022-06-01 MED ORDER — OXYCODONE-ACETAMINOPHEN 5-325 MG PO TABS: 1.0000 | ORAL_TABLET | ORAL | 0 refills | Status: AC | PRN

## 2022-06-01 MED ORDER — IBUPROFEN 800 MG PO TABS: 800.0000 mg | ORAL_TABLET | Freq: Three times a day (TID) | ORAL | 0 refills | Status: AC

## 2022-06-01 NOTE — Progress Notes (Signed)
Pt called answering service. Says she is in severe pain. Had ankle arthroscopy 3/4 with dr. Posey Pronto.  She says she has been [place] in cam boot and doing too much on her feet as she had a lot to do last few days. Admits this may be what is causing her pain.  I recommend stop walking on operative foot and ok to remove boot when not moving around/ loosen ace wrap as probably having a lot of swelling. Ice behind knee  Will send refill of percocet and ibuprofen. F/u next week.

## 2022-06-03 ENCOUNTER — Encounter: Payer: Medicaid Other | Admitting: Podiatry

## 2022-06-03 ENCOUNTER — Telehealth: Payer: Self-pay | Admitting: *Deleted

## 2022-06-03 NOTE — Telephone Encounter (Signed)
Patient will come in at 2:30 today if she can get someone to pick her son up from school

## 2022-06-03 NOTE — Telephone Encounter (Signed)
Pt called and could not make appt today that was an emergency work in.  I have her rescheduled for [place] office tomorrow 3.12 with Dr Amalia Hailey in the am. She stated she was walking on the foot and should not have been and she cannot walk now.  She wanted to see Dr Posey Pronto but I explained he was not in [place] office tomorrow only in [place] and she agreed to see Dr Amalia Hailey in [place].

## 2022-06-03 NOTE — Telephone Encounter (Signed)
Patient called - surgery with Posey Pronto on 05/27/22, in extreme pain, can't bear weight, pains meds not helping, first POV on 3/14, but can not wait, wants it checked today.

## 2022-06-03 NOTE — Telephone Encounter (Signed)
Sent message to Estill Bamberg to see if we could add to [place] schedule today at 2:30, will confirm with patient if good.

## 2022-06-04 ENCOUNTER — Encounter: Payer: Self-pay | Admitting: Podiatry

## 2022-06-04 ENCOUNTER — Telehealth: Payer: Self-pay | Admitting: Podiatry

## 2022-06-04 ENCOUNTER — Encounter: Payer: Medicaid Other | Admitting: Podiatry

## 2022-06-04 NOTE — Telephone Encounter (Signed)
Pt called today to cancel this appt. This makes the 4th appt she has called to cancel/reschedule her POV appts. She is on the schedule for Thurs 3/14 ut states its an emergency because she cannot walk. She wants to speak with a nurse.   Please advise

## 2022-06-05 ENCOUNTER — Telehealth: Payer: Self-pay

## 2022-06-05 DIAGNOSIS — M19079 Primary osteoarthritis, unspecified ankle and foot: Secondary | ICD-10-CM

## 2022-06-05 NOTE — Telephone Encounter (Signed)
Patient called asking for a home health aide referral to holistic home, patient states she needs help from surgery. Please Advise.

## 2022-06-05 NOTE — Telephone Encounter (Signed)
[facility] is the only one taking new patients right now. I was not aware of this patient due to I don't assist Dr. Janae Bridgeman Does.

## 2022-06-05 NOTE — Addendum Note (Signed)
Addended by: Boneta Lucks on: 06/05/2022 12:11 PM   Modules accepted: Orders

## 2022-06-06 ENCOUNTER — Encounter: Payer: Self-pay | Admitting: Podiatry

## 2022-06-06 ENCOUNTER — Ambulatory Visit (INDEPENDENT_AMBULATORY_CARE_PROVIDER_SITE_OTHER): Payer: Medicaid Other | Admitting: Podiatry

## 2022-06-06 DIAGNOSIS — Z9889 Other specified postprocedural states: Secondary | ICD-10-CM

## 2022-06-06 MED ORDER — GABAPENTIN 300 MG PO CAPS: 300.0000 mg | ORAL_CAPSULE | Freq: Three times a day (TID) | ORAL | 3 refills | Status: DC

## 2022-06-06 MED ORDER — OXYCODONE-ACETAMINOPHEN 5-325 MG PO TABS: 1.0000 | ORAL_TABLET | ORAL | 0 refills | Status: DC | PRN

## 2022-06-06 MED ORDER — METHYLPREDNISOLONE 4 MG PO TBPK: ORAL_TABLET | ORAL | 0 refills | Status: DC

## 2022-06-06 NOTE — Progress Notes (Signed)
Subjective:  Patient ID: Madison Singleton, female    DOB: 10-24-1990,  MRN: [id number]  Chief Complaint  Patient presents with   Routine Post Op    "I'm not able to put any pressure on it.  I can't walk.  I had to hop in here with the help of my husband."    DOS: 05/27/2022 Procedure: Right ankle arthroscopy  32 y.o. female returns for post-op check.  Patient states that she is doing well.  Some pain.  She ran out of pain medication.  She has a hard time walking.  She denies any other acute complaints  Review of Systems: Negative except as noted in the HPI. Denies N/V/F/Ch.  Past Medical History:  Diagnosis Date   Anxiety    NO MEDS   Arthritis of ankle    Breast mass    Breast tenderness in female    3;00 x3 weeks   Chronic pain syndrome    Depression    NO MEDS   GERD (gastroesophageal reflux disease)    OCC-NO MEDS   Headache    HAS RECENTLY STARTED HAVING HA'S MORE FREQUENTLY   Obesity    OSA (obstructive sleep apnea)     Current Outpatient Medications:    gabapentin (NEURONTIN) 300 MG capsule, Take 1 capsule (300 mg total) by mouth 3 (three) times daily., Disp: 90 capsule, Rfl: 3   ibuprofen (ADVIL) 800 MG tablet, Take 1 tablet (800 mg total) by mouth every 8 (eight) hours for 14 days., Disp: 42 tablet, Rfl: 0   methylPREDNISolone (MEDROL DOSEPAK) 4 MG TBPK tablet, Take as directed, Disp: 21 each, Rfl: 0   oxyCODONE-acetaminophen (PERCOCET) 10-325 MG tablet, Take 1 tablet by mouth every 4 (four) hours as needed for pain., Disp: 30 tablet, Rfl: 0   oxyCODONE-acetaminophen (PERCOCET) 5-325 MG tablet, Take 1 tablet by mouth every 4 (four) hours as needed for severe pain., Disp: 30 tablet, Rfl: 0   acetaminophen (TYLENOL) 325 MG tablet, Take 2 tablets (650 mg total) by mouth every 4 (four) hours as needed (for pain scale < 4)., Disp: , Rfl:    benzocaine-Menthol (DERMOPLAST) 20-0.5 % AERO, Apply 1 Application topically as needed for irritation (perineal discomfort)., Disp: ,  Rfl:    butalbital-acetaminophen-caffeine (FIORICET) 50-325-40 MG tablet, Take 1-2 tablets by mouth every 6 (six) hours as needed for headache., Disp: 20 tablet, Rfl: 0   ferrous sulfate 325 (65 FE) MG tablet, Take 1 tablet (325 mg total) by mouth 2 (two) times daily with a meal., Disp: , Rfl: 3   hydrOXYzine (ATARAX) 50 MG tablet, Take 1 tablet (50 mg total) by mouth every 4 (four) hours as needed for anxiety., Disp: 30 tablet, Rfl: 0   ibuprofen (ADVIL) 600 MG tablet, Take 1 tablet (600 mg total) by mouth every 6 (six) hours., Disp: 30 tablet, Rfl: 0   norethindrone (ORTHO MICRONOR) 0.35 MG tablet, Take 1 tablet (0.35 mg total) by mouth daily., Disp: 84 tablet, Rfl: 3   oxyCODONE-acetaminophen (PERCOCET) 5-325 MG tablet, Take 1 tablet by mouth every 4 (four) hours as needed for severe pain., Disp: 30 tablet, Rfl: 0   oxyCODONE-acetaminophen (PERCOCET) 5-325 MG tablet, Take 1 tablet by mouth every 4 (four) hours as needed for severe pain., Disp: 30 tablet, Rfl: 0   oxyCODONE-acetaminophen (PERCOCET) 5-325 MG tablet, Take 1 tablet by mouth every 4 (four) hours as needed for severe pain., Disp: 30 tablet, Rfl: 0   oxyCODONE-acetaminophen (PERCOCET) 5-325 MG tablet, Take 1 tablet by mouth  every 4 (four) hours as needed for severe pain., Disp: 30 tablet, Rfl: 0   oxyCODONE-acetaminophen (PERCOCET) 5-325 MG tablet, Take 1 tablet by mouth every 4 (four) hours as needed for severe pain., Disp: 30 tablet, Rfl: 0   oxyCODONE-acetaminophen (PERCOCET) 5-325 MG tablet, Take 1 tablet by mouth every 4 (four) hours as needed for severe pain., Disp: 30 tablet, Rfl: 0   oxyCODONE-acetaminophen (PERCOCET) 5-325 MG tablet, Take 1 tablet by mouth every 4 (four) hours as needed for severe pain., Disp: 30 tablet, Rfl: 0   oxyCODONE-acetaminophen (PERCOCET) 5-325 MG tablet, Take 1 tablet by mouth every 4 (four) hours as needed for severe pain., Disp: 30 tablet, Rfl: 0   oxyCODONE-acetaminophen (PERCOCET) 5-325 MG tablet,  Take 1 tablet by mouth every 4 (four) hours as needed for severe pain., Disp: 30 tablet, Rfl: 0   oxyCODONE-acetaminophen (PERCOCET) 5-325 MG tablet, Take 1 tablet by mouth every 4 (four) hours as needed for severe pain., Disp: 30 tablet, Rfl: 0   oxyCODONE-acetaminophen (PERCOCET) 5-325 MG tablet, Take 1 tablet by mouth every 4 (four) hours as needed for up to 5 days for severe pain., Disp: 20 tablet, Rfl: 0   Prenatal Vit-Fe Fumarate-FA (PRENATAL PO), Take 1 tablet by mouth daily., Disp: , Rfl:    witch hazel-glycerin (TUCKS) pad, Apply 1 Application topically as needed for hemorrhoids., Disp: 40 each, Rfl: 12  Social History   Tobacco Use  Smoking Status Some Days   Packs/day: 0.10   Years: 14.00   Additional pack years: 0.00   Total pack years: 1.40   Types: Cigarettes  Smokeless Tobacco Never    Allergies  Allergen Reactions   Haldol [Haloperidol Lactate]     Throat swelling   Amoxicillin Rash    Did it involve swelling of the face/tongue/throat, SOB, or low BP? Unknown Did it involve sudden or severe rash/hives, skin peeling, or any reaction on the inside of your mouth or nose? Yes Did you need to seek medical attention at a hospital or doctor's office? Yes When did it last happen? Childhood reaction. If all above answers are "NO", may proceed with cephalosporin use.    Objective:  There were no vitals filed for this visit. There is no height or weight on file to calculate BMI. Constitutional Well developed. Well nourished.  Vascular Foot warm and well perfused. Capillary refill normal to all digits.   Neurologic Normal speech. Oriented to person, place, and time. Epicritic sensation to light touch grossly present bilaterally.  Dermatologic Skin healing well without signs of infection. Skin edges well coapted without signs of infection.  Orthopedic: Tenderness to palpation noted about the surgical site.   Radiographs: None Assessment:   1. Post-operative state     Plan:  Patient was evaluated and treated and all questions answered.  S/p foot surgery right -Progressing as expected post-operatively. -XR: See above -WB Status: Weightbearing as tolerated in cam boot -Sutures: Intact.  No clinical signs of Deis is noted no complication noted. -Medications: Percocet, Medrol Dosepak, gabapentin -Foot redressed.  No follow-ups on file.

## 2022-06-09 ENCOUNTER — Other Ambulatory Visit: Payer: Self-pay | Admitting: Podiatry

## 2022-06-10 ENCOUNTER — Other Ambulatory Visit: Payer: Self-pay | Admitting: Podiatry

## 2022-06-11 ENCOUNTER — Other Ambulatory Visit: Payer: Self-pay | Admitting: Podiatry

## 2022-06-12 ENCOUNTER — Encounter: Payer: Self-pay | Admitting: Podiatry

## 2022-06-12 ENCOUNTER — Other Ambulatory Visit: Payer: Self-pay | Admitting: Podiatry

## 2022-06-12 MED ORDER — OXYCODONE-ACETAMINOPHEN 5-325 MG PO TABS: 1.0000 | ORAL_TABLET | ORAL | 0 refills | Status: DC | PRN

## 2022-06-18 ENCOUNTER — Telehealth: Payer: Self-pay | Admitting: Podiatry

## 2022-06-18 ENCOUNTER — Encounter: Payer: Medicaid Other | Admitting: Podiatry

## 2022-06-18 NOTE — Telephone Encounter (Signed)
Patient is requesting pain meds

## 2022-06-18 NOTE — Telephone Encounter (Signed)
Pt called back to inquire about her Rx refill for pain. Please advise

## 2022-06-21 ENCOUNTER — Encounter: Payer: Self-pay | Admitting: Podiatry

## 2022-06-24 ENCOUNTER — Other Ambulatory Visit (INDEPENDENT_AMBULATORY_CARE_PROVIDER_SITE_OTHER): Payer: Medicaid Other | Admitting: Podiatry

## 2022-06-24 MED ORDER — OXYCODONE-ACETAMINOPHEN 5-325 MG PO TABS: 1.0000 | ORAL_TABLET | ORAL | 0 refills | Status: DC | PRN

## 2022-06-24 NOTE — Progress Notes (Signed)
Refill of pain meds sent per pt request

## 2022-07-01 ENCOUNTER — Other Ambulatory Visit: Payer: Self-pay | Admitting: Podiatry

## 2022-07-01 MED ORDER — OXYCODONE-ACETAMINOPHEN 5-325 MG PO TABS: 1.0000 | ORAL_TABLET | ORAL | 0 refills | Status: DC | PRN

## 2022-07-01 NOTE — Telephone Encounter (Signed)
Pt requested a refill on Rx for pain medicine. Please advise. 

## 2022-07-08 ENCOUNTER — Emergency Department: Payer: Medicaid Other

## 2022-07-08 ENCOUNTER — Other Ambulatory Visit: Payer: Self-pay

## 2022-07-08 ENCOUNTER — Emergency Department
Admission: EM | Admit: 2022-07-08 | Discharge: 2022-07-08 | Disposition: A | Discharge status: Home / Self Care | Payer: Medicaid Other | Attending: Emergency Medicine | Admitting: Emergency Medicine

## 2022-07-08 DIAGNOSIS — S6992XA Unspecified injury of left wrist, hand and finger(s), initial encounter: Secondary | ICD-10-CM | POA: Diagnosis present

## 2022-07-08 MED ORDER — IBUPROFEN 600 MG PO TABS
600.0000 mg | ORAL_TABLET | Freq: Once | ORAL | Status: AC
  Administered 2022-07-08: 600 mg via ORAL
  Filled 2022-07-08: qty 1

## 2022-07-08 MED ORDER — ACETAMINOPHEN 325 MG PO TABS
650.0000 mg | ORAL_TABLET | Freq: Once | ORAL | Status: AC
  Administered 2022-07-08: 650 mg via ORAL
  Filled 2022-07-08: qty 2

## 2022-07-08 NOTE — Discharge Instructions (Signed)
Your x-ray does not reveal any broken bones.  He may continue to rest, ice, elevate your hand and take Tylenol/ibuprofen per package instructions to help with your symptoms.  Please return for any new, worsening, or change in symptoms or other concerns.  It is a pleasure caring for you today.
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
## 2022-07-08 NOTE — ED Notes (Signed)
Pt very rude with staff. Hand wrapped per request by this RN, however it was not medically necessary. Cms intact distal to injury and wrap.
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
## 2022-07-09 ENCOUNTER — Other Ambulatory Visit: Payer: Self-pay | Admitting: Podiatry

## 2022-07-09 ENCOUNTER — Ambulatory Visit (INDEPENDENT_AMBULATORY_CARE_PROVIDER_SITE_OTHER): Payer: Medicaid Other | Admitting: Podiatry

## 2022-07-09 ENCOUNTER — Telehealth: Payer: Self-pay | Admitting: Podiatry

## 2022-07-09 DIAGNOSIS — Z9889 Other specified postprocedural states: Secondary | ICD-10-CM

## 2022-07-09 MED ORDER — OXYCODONE-ACETAMINOPHEN 5-325 MG PO TABS: 1.0000 | ORAL_TABLET | ORAL | 0 refills | Status: DC | PRN

## 2022-07-09 NOTE — Telephone Encounter (Signed)
Patient came in to [facility] office today. She had an appt today but was schedule in [place]> Patient would like a pain medication refill. Pt uses [facility].

## 2022-07-10 ENCOUNTER — Other Ambulatory Visit: Payer: Self-pay

## 2022-07-10 DIAGNOSIS — O0942 Supervision of pregnancy with grand multiparity, second trimester: Secondary | ICD-10-CM | POA: Insufficient documentation

## 2022-07-10 NOTE — Progress Notes (Signed)
Patient wsa not seen

## 2022-07-11 ENCOUNTER — Ambulatory Visit: Payer: Medicaid Other | Admitting: Gastroenterology

## 2022-07-12 ENCOUNTER — Ambulatory Visit (INDEPENDENT_AMBULATORY_CARE_PROVIDER_SITE_OTHER): Payer: Medicaid Other | Admitting: Podiatry

## 2022-07-12 DIAGNOSIS — M19071 Primary osteoarthritis, right ankle and foot: Secondary | ICD-10-CM

## 2022-07-12 DIAGNOSIS — Z9889 Other specified postprocedural states: Secondary | ICD-10-CM

## 2022-07-12 MED ORDER — OXYCODONE-ACETAMINOPHEN 5-325 MG PO TABS: 1.0000 | ORAL_TABLET | ORAL | 0 refills | Status: DC | PRN

## 2022-07-12 NOTE — Addendum Note (Signed)
Addended by: Nicholes Rough on: 07/12/2022 10:04 AM   Modules accepted: Orders

## 2022-07-12 NOTE — Progress Notes (Signed)
  Subjective:  Patient ID: Madison Singleton, female    DOB: 05-Jun-1990,  MRN: [id number]  Chief Complaint  Patient presents with   Routine Post Op    DOS: 05/27/2022 Procedure: Right ankle arthroscopy  32 y.o. female returns for post-op check.  Patient states that she is doing well.  She does not much pain.  She would like to know if she can return to work.  Review of Systems: Negative except as noted in the HPI. Denies N/V/F/Ch.  Past Medical History:  Diagnosis Date   Anxiety    NO MEDS   Arthritis of ankle    Breast mass    Breast tenderness in female    3;00 x3 weeks   Chronic pain syndrome    Depression    NO MEDS   GERD (gastroesophageal reflux disease)    OCC-NO MEDS   Headache    HAS RECENTLY STARTED HAVING HA'S MORE FREQUENTLY   Obesity    OSA (obstructive sleep apnea)     Current Outpatient Medications:    gabapentin (NEURONTIN) 300 MG capsule, Take 1 capsule (300 mg total) by mouth 3 (three) times daily., Disp: 90 capsule, Rfl: 3   methylPREDNISolone (MEDROL DOSEPAK) 4 MG TBPK tablet, Take as directed, Disp: 21 each, Rfl: 0   oxyCODONE-acetaminophen (PERCOCET) 5-325 MG tablet, Take 1 tablet by mouth every 4 (four) hours as needed for severe pain., Disp: 20 tablet, Rfl: 0   oxyCODONE-acetaminophen (PERCOCET) 5-325 MG tablet, Take 1 tablet by mouth every 4 (four) hours as needed for severe pain., Disp: 30 tablet, Rfl: 0  Social History   Tobacco Use  Smoking Status Some Days   Packs/day: 0.10   Years: 14.00   Additional pack years: 0.00   Total pack years: 1.40   Types: Cigarettes  Smokeless Tobacco Never    Allergies  Allergen Reactions   Haldol [Haloperidol Lactate]     Throat swelling   Amoxicillin Rash    Did it involve swelling of the face/tongue/throat, SOB, or low BP? Unknown Did it involve sudden or severe rash/hives, skin peeling, or any reaction on the inside of your mouth or nose? Yes Did you need to seek medical attention at a hospital or  doctor's office? Yes When did it last happen? Childhood reaction. If all above answers are "NO", may proceed with cephalosporin use.    Objective:  There were no vitals filed for this visit. There is no height or weight on file to calculate BMI. Constitutional Well developed. Well nourished.  Vascular Foot warm and well perfused. Capillary refill normal to all digits.   Neurologic Normal speech. Oriented to person, place, and time. Epicritic sensation to light touch grossly present bilaterally.  Dermatologic Skin healing well without signs of infection. Skin edges well coapted without signs of infection.  Good range of motion noted at the ankle joint.  Mild crepitus noted.  No pain  Orthopedic: Mild Tenderness to palpation noted about the surgical site.   Radiographs: None Assessment:   1. Post-operative state   2. Arthritis of right ankle    Plan:  Patient was evaluated and treated and all questions answered.  S/p foot surgery right -Clinically doing much better.  Pain has considerably improved.  She can return to work.  I will send her final prescription of Percocet.  She will use it as needed to help with return to work  No follow-ups on file.

## 2022-07-17 ENCOUNTER — Other Ambulatory Visit: Payer: Self-pay | Admitting: Podiatry

## 2022-07-17 ENCOUNTER — Encounter: Payer: Self-pay | Admitting: Podiatry

## 2022-07-18 MED ORDER — OXYCODONE-ACETAMINOPHEN 5-325 MG PO TABS: 1.0000 | ORAL_TABLET | ORAL | 0 refills | Status: DC | PRN

## 2022-08-01 NOTE — Telephone Encounter (Signed)
Patient requesting a refill 

## 2022-08-07 ENCOUNTER — Other Ambulatory Visit: Payer: Self-pay | Admitting: Podiatry

## 2022-08-08 ENCOUNTER — Other Ambulatory Visit: Payer: Self-pay | Admitting: Podiatry

## 2022-08-08 ENCOUNTER — Telehealth: Payer: Self-pay | Admitting: Podiatry

## 2022-08-08 MED ORDER — OXYCODONE-ACETAMINOPHEN 5-325 MG PO TABS: 1.0000 | ORAL_TABLET | ORAL | 0 refills | Status: DC | PRN

## 2022-08-08 NOTE — Telephone Encounter (Signed)
Pt requested a Rx refill for pain medication. Please advise

## 2022-08-20 ENCOUNTER — Ambulatory Visit (INDEPENDENT_AMBULATORY_CARE_PROVIDER_SITE_OTHER): Payer: Medicaid Other | Admitting: Podiatry

## 2022-08-20 DIAGNOSIS — Z9889 Other specified postprocedural states: Secondary | ICD-10-CM

## 2022-08-20 DIAGNOSIS — M19071 Primary osteoarthritis, right ankle and foot: Secondary | ICD-10-CM

## 2022-08-20 MED ORDER — OXYCODONE-ACETAMINOPHEN 5-325 MG PO TABS: 1.0000 | ORAL_TABLET | ORAL | 0 refills | Status: DC | PRN

## 2022-08-20 NOTE — Progress Notes (Signed)
Subjective:  Patient ID: Madison Singleton, female    DOB: 06-20-1990,  MRN: [id number]  Chief Complaint  Patient presents with   Routine Post Op    DOS: 05/27/2022 Procedure: Right ankle arthroscopy  32 y.o. female returns for post-op check.  Patient states that she is doing well.  She does not much pain.  She would like to know if she can return to work.  Review of Systems: Negative except as noted in the HPI. Denies N/V/F/Ch.  Past Medical History:  Diagnosis Date   Anxiety    NO MEDS   Arthritis of ankle    Breast mass    Breast tenderness in female    3;00 x3 weeks   Chronic pain syndrome    Depression    NO MEDS   GERD (gastroesophageal reflux disease)    OCC-NO MEDS   Headache    HAS RECENTLY STARTED HAVING HA'S MORE FREQUENTLY   Obesity    OSA (obstructive sleep apnea)     Current Outpatient Medications:    oxyCODONE-acetaminophen (PERCOCET) 5-325 MG tablet, Take 1 tablet by mouth every 4 (four) hours as needed for severe pain., Disp: 30 tablet, Rfl: 0   gabapentin (NEURONTIN) 300 MG capsule, Take 1 capsule (300 mg total) by mouth 3 (three) times daily., Disp: 90 capsule, Rfl: 3   methylPREDNISolone (MEDROL DOSEPAK) 4 MG TBPK tablet, Take as directed, Disp: 21 each, Rfl: 0   oxyCODONE-acetaminophen (PERCOCET) 5-325 MG tablet, Take 1 tablet by mouth every 4 (four) hours as needed for severe pain., Disp: 20 tablet, Rfl: 0   oxyCODONE-acetaminophen (PERCOCET) 5-325 MG tablet, Take 1 tablet by mouth every 4 (four) hours as needed for severe pain., Disp: 30 tablet, Rfl: 0   oxyCODONE-acetaminophen (PERCOCET) 5-325 MG tablet, Take 1 tablet by mouth every 4 (four) hours as needed for severe pain., Disp: 30 tablet, Rfl: 0   oxyCODONE-acetaminophen (PERCOCET) 5-325 MG tablet, Take 1 tablet by mouth every 4 (four) hours as needed for severe pain., Disp: 30 tablet, Rfl: 0   oxyCODONE-acetaminophen (PERCOCET) 5-325 MG tablet, Take 1 tablet by mouth every 4 (four) hours as needed  for severe pain., Disp: 30 tablet, Rfl: 0   oxyCODONE-acetaminophen (PERCOCET) 5-325 MG tablet, Take 1 tablet by mouth every 4 (four) hours as needed for severe pain., Disp: 30 tablet, Rfl: 0  Social History   Tobacco Use  Smoking Status Some Days   Packs/day: 0.10   Years: 14.00   Additional pack years: 0.00   Total pack years: 1.40   Types: Cigarettes  Smokeless Tobacco Never    Allergies  Allergen Reactions   Haldol [Haloperidol Lactate]     Throat swelling   Amoxicillin Rash    Did it involve swelling of the face/tongue/throat, SOB, or low BP? Unknown Did it involve sudden or severe rash/hives, skin peeling, or any reaction on the inside of your mouth or nose? Yes Did you need to seek medical attention at a hospital or doctor's office? Yes When did it last happen? Childhood reaction. If all above answers are "NO", may proceed with cephalosporin use.    Objective:  There were no vitals filed for this visit. There is no height or weight on file to calculate BMI. Constitutional Well developed. Well nourished.  Vascular Foot warm and well perfused. Capillary refill normal to all digits.   Neurologic Normal speech. Oriented to person, place, and time. Epicritic sensation to light touch grossly present bilaterally.  Dermatologic Skin healing well without signs of  infection. Skin edges well coapted without signs of infection.  Good range of motion noted at the ankle joint.  Mild crepitus noted.  No pain  Orthopedic: Mild Tenderness to palpation noted about the surgical site.   Radiographs: None Assessment:   1. Post-operative state   2. Arthritis of right ankle     Plan:  Patient was evaluated and treated and all questions answered.  S/p foot surgery right -Clinically doing much better.  Pain is improving but unable to walk for long period of time.  Work note was given.  She can return back to work with restrictions.  If any foot and ankle issues arise in future she  will come back and see me  No follow-ups on file.

## 2022-08-26 ENCOUNTER — Telehealth: Payer: Self-pay | Admitting: Podiatry

## 2022-08-26 MED ORDER — OXYCODONE-ACETAMINOPHEN 5-325 MG PO TABS: 1.0000 | ORAL_TABLET | ORAL | 0 refills | Status: DC | PRN

## 2022-08-26 NOTE — Addendum Note (Signed)
Addended by: Nicholes Rough on: 08/26/2022 09:46 AM   Modules accepted: Orders

## 2022-08-26 NOTE — Telephone Encounter (Signed)
Madison Singleton called this morning, requesting more medication.  Had to call her back to find out which medication she was requesting.  It's the oxycodone.  She denies any injury to the foot since last seen.  Just says that she's "been on it a lot".  States pain is 9/10 on pain scale.  Thank you.

## 2022-09-05 ENCOUNTER — Telehealth: Payer: Self-pay | Admitting: Podiatry

## 2022-09-05 MED ORDER — OXYCODONE-ACETAMINOPHEN 5-325 MG PO TABS: 1.0000 | ORAL_TABLET | ORAL | 0 refills | Status: DC | PRN

## 2022-09-05 NOTE — Telephone Encounter (Signed)
Pt requesting a refill on pain Rx Oxycodone 5-325mg   Please advise

## 2022-09-05 NOTE — Telephone Encounter (Signed)
Pt called back to see if Dr Allena Katz has responded to the previous message for a medication refill.  Checked chart and he has not.

## 2022-09-17 ENCOUNTER — Telehealth: Payer: Self-pay | Admitting: Podiatry

## 2022-09-17 NOTE — Telephone Encounter (Signed)
Pt calling requesting pain medication.  Please advise. 

## 2022-09-17 NOTE — Telephone Encounter (Signed)
Pt called back to see if on call provider had sent in rx for pain medication. I did explain provider on call is also seeing pts but would send another message to provider.

## 2022-09-17 NOTE — Telephone Encounter (Signed)
Patient called stating she would like a refill of her pain medication. Patient also wanted Dr Allena Katz to put in her records that she is not able to stand due to her trying to get disability.

## 2022-09-18 MED ORDER — OXYCODONE-ACETAMINOPHEN 5-325 MG PO TABS: 1.0000 | ORAL_TABLET | ORAL | 0 refills | Status: DC | PRN

## 2022-09-18 NOTE — Telephone Encounter (Signed)
Per Dr Algernon Huxley sent to pharmacy

## 2022-09-19 ENCOUNTER — Encounter: Payer: Self-pay | Admitting: Podiatry

## 2022-09-30 ENCOUNTER — Other Ambulatory Visit: Payer: Self-pay | Admitting: Podiatry

## 2022-09-30 ENCOUNTER — Encounter: Payer: Self-pay | Admitting: Podiatry

## 2022-10-01 ENCOUNTER — Telehealth: Payer: Self-pay | Admitting: Podiatry

## 2022-10-01 MED ORDER — OXYCODONE-ACETAMINOPHEN 5-325 MG PO TABS: 1.0000 | ORAL_TABLET | ORAL | 0 refills | Status: DC | PRN

## 2022-10-01 NOTE — Telephone Encounter (Signed)
Pt requested a Rx refill for pain. Please advise 

## 2022-10-01 NOTE — Telephone Encounter (Signed)
Pt aware of Rx update for pain medication.

## 2022-10-09 ENCOUNTER — Telehealth: Payer: Self-pay | Admitting: Podiatry

## 2022-10-09 ENCOUNTER — Other Ambulatory Visit: Payer: Self-pay | Admitting: Podiatry

## 2022-10-09 MED ORDER — OXYCODONE-ACETAMINOPHEN 5-325 MG PO TABS: 1.0000 | ORAL_TABLET | ORAL | 0 refills | Status: DC | PRN

## 2022-10-09 NOTE — Telephone Encounter (Signed)
Pt called asking for a refill on her pain medication. She is scheduled to see you in [place] tomorrow.

## 2022-10-09 NOTE — Telephone Encounter (Signed)
Pt requested a refill on Rx for pain. Please advise

## 2022-10-09 NOTE — Telephone Encounter (Signed)
Patient called asking for pain medication refill to be sent to [facility]

## 2022-10-09 NOTE — Addendum Note (Signed)
Addended by: Nicholes Rough on: 10/09/2022 04:01 PM   Modules accepted: Orders

## 2022-10-10 ENCOUNTER — Ambulatory Visit: Payer: 59 | Admitting: Podiatry

## 2022-10-17 ENCOUNTER — Ambulatory Visit (INDEPENDENT_AMBULATORY_CARE_PROVIDER_SITE_OTHER): Payer: 59 | Admitting: Podiatry

## 2022-10-17 DIAGNOSIS — M19071 Primary osteoarthritis, right ankle and foot: Secondary | ICD-10-CM | POA: Diagnosis not present

## 2022-10-17 DIAGNOSIS — G894 Chronic pain syndrome: Secondary | ICD-10-CM | POA: Diagnosis not present

## 2022-10-17 MED ORDER — OXYCODONE-ACETAMINOPHEN 5-325 MG PO TABS: 1.0000 | ORAL_TABLET | ORAL | 0 refills | Status: DC | PRN

## 2022-10-17 NOTE — Progress Notes (Signed)
Subjective:  Patient ID: Madison Singleton, female    DOB: November 29, 1990,  MRN: [id number]  Chief Complaint  Patient presents with   Foot Pain    est - right ankle pain    32 y.o. female presents with the above complaint.  Patient presents with complaint of right ankle edema and some neuritis symptoms.  She states that is preventing her from working and walking.  She cannot stand on her ankle because of the pain associated with it and the underlying arthritis.  She would like to discuss treatment options for this.  She denies any other acute complaints.   Review of Systems: Negative except as noted in the HPI. Denies N/V/F/Ch.  Past Medical History:  Diagnosis Date   Anxiety    NO MEDS   Arthritis of ankle    Breast mass    Breast tenderness in female    3;00 x3 weeks   Chronic pain syndrome    Depression    NO MEDS   GERD (gastroesophageal reflux disease)    OCC-NO MEDS   Headache    HAS RECENTLY STARTED HAVING HA'S MORE FREQUENTLY   Obesity    OSA (obstructive sleep apnea)     Current Outpatient Medications:    gabapentin (NEURONTIN) 300 MG capsule, Take 1 capsule (300 mg total) by mouth 3 (three) times daily., Disp: 90 capsule, Rfl: 3   methylPREDNISolone (MEDROL DOSEPAK) 4 MG TBPK tablet, Take as directed, Disp: 21 each, Rfl: 0   oxyCODONE-acetaminophen (PERCOCET) 5-325 MG tablet, Take 1 tablet by mouth every 4 (four) hours as needed for severe pain., Disp: 20 tablet, Rfl: 0   oxyCODONE-acetaminophen (PERCOCET) 5-325 MG tablet, Take 1 tablet by mouth every 4 (four) hours as needed for severe pain., Disp: 30 tablet, Rfl: 0   oxyCODONE-acetaminophen (PERCOCET) 5-325 MG tablet, Take 1 tablet by mouth every 4 (four) hours as needed for severe pain., Disp: 30 tablet, Rfl: 0   oxyCODONE-acetaminophen (PERCOCET) 5-325 MG tablet, Take 1 tablet by mouth every 4 (four) hours as needed for severe pain., Disp: 30 tablet, Rfl: 0   oxyCODONE-acetaminophen (PERCOCET) 5-325 MG tablet, Take 1  tablet by mouth every 4 (four) hours as needed for severe pain., Disp: 30 tablet, Rfl: 0   oxyCODONE-acetaminophen (PERCOCET) 5-325 MG tablet, Take 1 tablet by mouth every 4 (four) hours as needed for severe pain., Disp: 30 tablet, Rfl: 0   oxyCODONE-acetaminophen (PERCOCET) 5-325 MG tablet, Take 1 tablet by mouth every 4 (four) hours as needed for severe pain., Disp: 30 tablet, Rfl: 0   oxyCODONE-acetaminophen (PERCOCET) 5-325 MG tablet, Take 1 tablet by mouth every 4 (four) hours as needed for severe pain., Disp: 30 tablet, Rfl: 0   oxyCODONE-acetaminophen (PERCOCET) 5-325 MG tablet, Take 1 tablet by mouth every 4 (four) hours as needed for severe pain., Disp: 30 tablet, Rfl: 0   oxyCODONE-acetaminophen (PERCOCET) 5-325 MG tablet, Take 1 tablet by mouth every 4 (four) hours as needed for severe pain., Disp: 30 tablet, Rfl: 0   oxyCODONE-acetaminophen (PERCOCET) 5-325 MG tablet, Take 1 tablet by mouth every 4 (four) hours as needed for severe pain., Disp: 30 tablet, Rfl: 0  Social History   Tobacco Use  Smoking Status Some Days   Current packs/day: 0.10   Average packs/day: 0.1 packs/day for 14.0 years (1.4 ttl pk-yrs)   Types: Cigarettes  Smokeless Tobacco Never    Allergies  Allergen Reactions   Haldol [Haloperidol Lactate]     Throat swelling   Amoxicillin Rash  Did it involve swelling of the face/tongue/throat, SOB, or low BP? Unknown Did it involve sudden or severe rash/hives, skin peeling, or any reaction on the inside of your mouth or nose? Yes Did you need to seek medical attention at a hospital or doctor's office? Yes When did it last happen? Childhood reaction. If all above answers are "NO", may proceed with cephalosporin use.    Objective:  There were no vitals filed for this visit. There is no height or weight on file to calculate BMI. Constitutional Well developed. Well nourished.  Vascular Dorsalis pedis pulses palpable bilaterally. Posterior tibial pulses  palpable bilaterally. Capillary refill normal to all digits.  No cyanosis or clubbing noted. Pedal hair growth normal.  Neurologic Normal speech. Oriented to person, place, and time. Epicritic sensation to light touch grossly present bilaterally.  Dermatologic Right right ankle edema noted.  No pinpoint tenderness noted.  Some neuritis symptoms noted.  There may be likely some ankle impingement from arthritis  Orthopedic: Normal joint ROM without pain or crepitus bilaterally. No visible deformities. No bony tenderness.   Radiographs: None Assessment:   1. Arthritis of right ankle   2. Chronic pain syndrome    Plan:  Patient was evaluated and treated and all questions answered.  Right ankle edema with underlying some impingement due to the underlying ankle arthritis -Patient and concerns were discussed with the patient extensive detail -Patient has not able to stand on her feet for long period of time.  It is limiting her ability to walk.  This is leading to her antalgic gait.  Ultimately this is likely leading to her being very painful on her leg.  She is unable to perform her activities/work due to the pain -Pain medication was sent in to be used sporadically -I discussed with her that patient will benefit from ankle arthroscopy in the future if needed.  She states understanding  No follow-ups on file.

## 2022-10-23 ENCOUNTER — Emergency Department
Admission: EM | Admit: 2022-10-23 | Discharge: 2022-10-23 | Disposition: A | Discharge status: Home / Self Care | Payer: Medicaid Other | Attending: Emergency Medicine | Admitting: Emergency Medicine

## 2022-10-23 ENCOUNTER — Emergency Department: Payer: Medicaid Other

## 2022-10-23 ENCOUNTER — Other Ambulatory Visit: Payer: Self-pay

## 2022-10-23 DIAGNOSIS — R1084 Generalized abdominal pain: Secondary | ICD-10-CM | POA: Insufficient documentation

## 2022-10-23 DIAGNOSIS — Z3A01 Less than 8 weeks gestation of pregnancy: Secondary | ICD-10-CM | POA: Diagnosis not present

## 2022-10-23 DIAGNOSIS — O99011 Anemia complicating pregnancy, first trimester: Secondary | ICD-10-CM | POA: Insufficient documentation

## 2022-10-23 DIAGNOSIS — O26891 Other specified pregnancy related conditions, first trimester: Secondary | ICD-10-CM | POA: Insufficient documentation

## 2022-10-23 DIAGNOSIS — E871 Hypo-osmolality and hyponatremia: Secondary | ICD-10-CM | POA: Insufficient documentation

## 2022-10-23 LAB — COMPREHENSIVE METABOLIC PANEL
ALT: 22 U/L (ref 0–44)
AST: 20 U/L (ref 15–41)
Albumin: 3.7 g/dL (ref 3.5–5.0)
Alkaline Phosphatase: 37 U/L — ABNORMAL LOW (ref 38–126)
Anion gap: 6 (ref 5–15)
BUN: 9 mg/dL (ref 6–20)
CO2: 24 mmol/L (ref 22–32)
Calcium: 8.7 mg/dL — ABNORMAL LOW (ref 8.9–10.3)
Chloride: 102 mmol/L (ref 98–111)
Creatinine, Ser: 0.68 mg/dL (ref 0.44–1.00)
GFR, Estimated: >60 mL/min (ref >60)
Glucose, Bld: 158 mg/dL — ABNORMAL HIGH (ref 70–99)
Potassium: 3.5 mmol/L (ref 3.5–5.1)
Sodium: 132 mmol/L — ABNORMAL LOW (ref 135–145)
Total Bilirubin: 0.2 mg/dL — ABNORMAL LOW (ref 0.3–1.2)
Total Protein: 7.1 g/dL (ref 6.5–8.1)

## 2022-10-23 LAB — CBC
HCT: 37.5 % (ref 36.0–46.0)
Hemoglobin: 11.9 g/dL — ABNORMAL LOW (ref 12.0–15.0)
MCH: 26 pg (ref 26.0–34.0)
MCHC: 31.7 g/dL (ref 30.0–36.0)
MCV: 81.9 fL (ref 80.0–100.0)
Platelets: 438 10*3/uL — ABNORMAL HIGH (ref 150–400)
RBC: 4.58 MIL/uL (ref 3.87–5.11)
RDW: 15.9 % — ABNORMAL HIGH (ref 11.5–15.5)
WBC: 6.7 10*3/uL (ref 4.0–10.5)
nRBC: 0 % (ref 0.0–0.2)

## 2022-10-23 LAB — BASIC METABOLIC PANEL
Anion gap: 7 (ref 5–15)
BUN: 10 mg/dL (ref 6–20)
CO2: 25 mmol/L (ref 22–32)
Calcium: 8.7 mg/dL — ABNORMAL LOW (ref 8.9–10.3)
Chloride: 101 mmol/L (ref 98–111)
Creatinine, Ser: 0.68 mg/dL (ref 0.44–1.00)
GFR, Estimated: >60 mL/min (ref >60)
Glucose, Bld: 162 mg/dL — ABNORMAL HIGH (ref 70–99)
Potassium: 3.5 mmol/L (ref 3.5–5.1)
Sodium: 133 mmol/L — ABNORMAL LOW (ref 135–145)

## 2022-10-23 LAB — LIPASE, BLOOD: Lipase: 41 U/L (ref 11–51)

## 2022-10-23 LAB — HCG, QUANTITATIVE, PREGNANCY: hCG, Beta Chain, Quant, S: 4369 m[IU]/mL — ABNORMAL HIGH (ref <5)

## 2022-10-23 MED ORDER — OXYCODONE-ACETAMINOPHEN 5-325 MG PO TABS
1.0000 | ORAL_TABLET | Freq: Once | ORAL | Status: AC
  Administered 2022-10-23: 1 via ORAL
  Filled 2022-10-23: qty 1

## 2022-10-23 MED ORDER — HYDROXYZINE HCL 50 MG PO TABS
50.0000 mg | ORAL_TABLET | Freq: Once | ORAL | Status: AC
  Administered 2022-10-23: 50 mg via ORAL
  Filled 2022-10-23: qty 1

## 2022-10-23 MED ORDER — ACETAMINOPHEN 325 MG PO TABS
650.0000 mg | ORAL_TABLET | Freq: Once | ORAL | Status: AC
  Administered 2022-10-23: 650 mg via ORAL
  Filled 2022-10-23: qty 2

## 2022-10-23 NOTE — ED Notes (Signed)
Pt called out, NT spoke with pt and pt informed NT that the tylenol has not improved her ABD pain and is requesting anti-anxiety medication. PA provider made aware of pt concerns.

## 2022-10-23 NOTE — ED Provider Notes (Signed)
[facility] Provider Note    Event Date/Time   First MD Initiated Contact with Patient 10/23/22 1648     (approximate)   History   Abdominal Pain   HPI  Madison Singleton is a 32 y.o. female G8P8, PMH of GAD, arthritis of the ankle and a positive pregnancy test 3 days ago presents for evaluation of abdominal pain.  Patient was seen in urgent care and evaluated for spider bite where she had a positive pregnancy test.  Beta-hCG was 1754.  She developed abdominal pain this morning and was advised by her PCP to come to the ED to rule out an ectopic pregnancy.  Patient denies vaginal bleeding.  She has had some nausea but no vomiting.      Physical Exam   Triage Vital Signs: ED Triage Vitals  Encounter Vitals Group     BP 10/23/22 1619 106/64     Systolic BP Percentile --      Diastolic BP Percentile --      Pulse Rate 10/23/22 1619 75     Resp 10/23/22 1619 16     Temp 10/23/22 1619 98.2 F (36.8 C)     Temp src --      SpO2 10/23/22 1619 98 %     Weight 10/23/22 1617 165 lb (74.8 kg)     Height 10/23/22 1617 5\' 1"  (1.549 m)     Head Circumference --      Peak Flow --      Pain Score 10/23/22 1617 10     Pain Loc --      Pain Education --      Exclude from Growth Chart --     Most recent vital signs: Vitals:   10/23/22 2023 10/23/22 2135  BP: (!) 94/57 102/64  Pulse: (!) 58 62  Resp: 16 14  Temp: 98.3 F (36.8 C) 98.3 F (36.8 C)  SpO2: 98% 98%    General: Awake, no distress.  CV:  Good peripheral perfusion.  RRR. Resp:  Normal effort.  CTAB. Abd:  No distention.  Soft, tender to palpation mostly in the upper abdomen and suprapubically.    ED Results / Procedures / Treatments   Labs (all labs ordered are listed, but only abnormal results are displayed) Labs Reviewed  CBC - Abnormal; Notable for the following components:      Result Value   Hemoglobin 11.9 (*)    RDW 15.9 (*)    Platelets 438 (*)    All other components  within normal limits  BASIC METABOLIC PANEL - Abnormal; Notable for the following components:   Sodium 133 (*)    Glucose, Bld 162 (*)    Calcium 8.7 (*)    All other components within normal limits  HCG, QUANTITATIVE, PREGNANCY - Abnormal; Notable for the following components:   hCG, Beta Chain, Quant, S 4,369 (*)    All other components within normal limits  COMPREHENSIVE METABOLIC PANEL - Abnormal; Notable for the following components:   Sodium 132 (*)    Glucose, Bld 158 (*)    Calcium 8.7 (*)    Alkaline Phosphatase 37 (*)    Total Bilirubin 0.2 (*)    All other components within normal limits  LIPASE, BLOOD    RADIOLOGY  Pelvic ultrasound obtained, interpreted the images as well as reviewed the radiologist report.    PROCEDURES:  Critical Care performed: No  Procedures   MEDICATIONS ORDERED IN ED: Medications  acetaminophen (TYLENOL) tablet  650 mg (650 mg Oral Given 10/23/22 1745)  hydrOXYzine (ATARAX) tablet 50 mg (50 mg Oral Given 10/23/22 1934)  oxyCODONE-acetaminophen (PERCOCET/ROXICET) 5-325 MG per tablet 1 tablet (1 tablet Oral Given 10/23/22 1934)     IMPRESSION / MDM / ASSESSMENT AND PLAN / ED COURSE  I reviewed the triage vital signs and the nursing notes.                              Differential diagnosis includes, but is not limited to, IUP, ectopic pregnancy, miscarriage, gastritis, pancreatitis, cholecystitis, biliary colic.  Patient's presentation is most consistent with acute complicated illness / injury requiring diagnostic workup.  CMP notable for mild hyponatremia, mild hypocalcemia and elevated glucose.  CBC shows anemia.  Lipase WNL.  Beta hCG was 4,369 increased from 1,754 on 10/19/22.  Pelvic ultrasound obtained to evaluate for possible ectopic pregnancy.  I interpreted the images and reviewed the radiologist report.  Ultrasound showed single viable IUP approximately 6 weeks and 2 days.  Given that I was able to rule out an ectopic, I  wanted to consider other possible causes for her abdominal pain.  She reported most of her pain was in the right upper quadrant, so I obtained an ultrasound to evaluate this.  Patient was given Tylenol for her pain.  She stated that this did not help, so she was given Percocet.  She reported an improvement in her pain after this medication.  Patient requested something for anxiety so I gave her hydroxyzine.  Patient explained to nursing staff that she needed to leave before 9:45 PM or she will not have a ride.  At this point I did not have an official radiology read on her RUQ ultrasound, I reviewed the images myself and did not see any acute abnormalities.  There were no gallstones present, no gallbladder wall thickening and common bile duct was normal width.  At the point of patient's discharge I am still not exactly sure what is causing her abdominal pain, however I have been able to rule out emergent causes.  Her lab work is reassuring.  Her imaging was normal.  She did report an improvement in her pain.  Patient feels that her symptoms may be related to constipation so I advised her on OTC medication for this.  She is to follow-up with her OB for pregnancy management and her primary care if she continues to have abdominal pain.  She is aware she can return to the ED with any new or worsening symptoms.  She voiced understanding, all questions were answered and she was stable at discharge.      FINAL CLINICAL IMPRESSION(S) / ED DIAGNOSES   Final diagnoses:  Generalized abdominal pain     Rx / DC Orders   ED Discharge Orders     None        Note:  This document was prepared using Dragon voice recognition software and may include unintentional dictation errors.   Cameron Ali, PA-C 10/23/22 2144    Minna Antis, MD 10/29/22 2300

## 2022-10-23 NOTE — ED Notes (Signed)
Provided pt with discharge instructions and education. All of pt questions answered. Pt in possession of all belongings. Pt AAOX4 and stable at time of discharge.Pt ambulated w/ steady gait towards ED exit. Pt has family enroute for tx home.

## 2022-10-23 NOTE — ED Notes (Signed)
Pt provided with 2 apple juices, saltines, graham crackers, and peanut butter as requested.

## 2022-10-23 NOTE — ED Notes (Signed)
US at bedside

## 2022-10-23 NOTE — ED Notes (Signed)
Pt stated to NT that if she is not discharged by 2145, she will not have a ride home. Re-informed pt that we are waiting for [place] results from radiology.

## 2022-10-23 NOTE — ED Notes (Signed)
Waiting on RUQ [place] results

## 2022-10-23 NOTE — ED Notes (Signed)
ED Provider at bedside. 

## 2022-10-23 NOTE — ED Notes (Signed)
Provided pt with hospital landline phone to call family.

## 2022-10-23 NOTE — ED Notes (Signed)
Assumed care of pt at this time. Pt is AAXO4, NAD, pt asking why here treatment is taking so long, states her significant other cannot drive past 1610. Informed pt that I will speak with the provider and find out current status of plan of care.

## 2022-10-23 NOTE — Discharge Instructions (Addendum)
Your ultrasound confirmed that you have an intrauterine pregnancy and you do not have an ectopic pregnancy.  You are 6 weeks and 2 days pregnant based on your ultrasound.  Your abdominal ultrasound showed that your gallbladder and liver are fine.  Your lab work is reassuring.  It is possible that your pain is from constipation, this is treated with over-the-counter medication.  You will want to look for a medication called Metamucil or Colace.  Take the medication as prescribed on the package.  Please follow-up with your OB for management of your pregnancy.  You can see your primary care provider if you continue to have abdominal pain.  Please return to the ED if you have any new or worsening pain.

## 2022-10-23 NOTE — ED Triage Notes (Addendum)
Pt to ED for lower abd pain started this am. Reports positive pregnancy test, states had period this month.  Was sent by PCP to rule out ectopic pregnancy Pt eating slushi in triage and playing on phone.

## 2022-10-24 ENCOUNTER — Other Ambulatory Visit: Payer: Self-pay | Admitting: Podiatry

## 2022-10-24 ENCOUNTER — Telehealth: Payer: Self-pay | Admitting: Podiatry

## 2022-10-24 NOTE — Telephone Encounter (Signed)
pt need refill on pain medication

## 2022-10-25 NOTE — Telephone Encounter (Signed)
Pt called again to see if her pain medication was sent in.

## 2022-10-28 ENCOUNTER — Telehealth: Payer: Self-pay | Admitting: Podiatry

## 2022-10-28 NOTE — Telephone Encounter (Signed)
Pt. Called again for refill on Oxycodone. She stated that 30 pills last for 5 days

## 2022-10-29 ENCOUNTER — Telehealth: Payer: Self-pay | Admitting: Podiatry

## 2022-10-29 NOTE — Telephone Encounter (Signed)
pain med refill

## 2022-10-31 ENCOUNTER — Other Ambulatory Visit: Payer: Self-pay | Admitting: Podiatry

## 2022-11-01 ENCOUNTER — Encounter: Payer: Self-pay | Admitting: Podiatry

## 2022-11-04 ENCOUNTER — Telehealth: Payer: Self-pay | Admitting: Podiatry

## 2022-11-04 ENCOUNTER — Other Ambulatory Visit: Payer: Self-pay | Admitting: Podiatry

## 2022-11-04 MED ORDER — OXYCODONE-ACETAMINOPHEN 5-325 MG PO TABS: 1.0000 | ORAL_TABLET | ORAL | 0 refills | Status: DC | PRN

## 2022-11-04 NOTE — Telephone Encounter (Signed)
Patient has called and would like a refill on her pain medication if you could advise and contact patient

## 2022-11-12 ENCOUNTER — Ambulatory Visit: Payer: 59 | Admitting: Gastroenterology

## 2022-11-12 NOTE — Progress Notes (Deleted)
Wyline Mood MD, MRCP(U.K) [address]  [address]  [place], [place] [address]  Main: [phone]  Fax: [phone]   Gastroenterology Consultation  Referring Provider:     [facility] Primary Care Physician:  Leanna Sato, MD Primary Gastroenterologist:  Dr. Wyline Mood  Reason for Consultation:     GERD        HPI:   Madison Singleton is a 32 y.o. y/o female referred for consultation & management  by Dr. Marvis Moeller, Doralee Albino, MD.  ***  Past Medical History:  Diagnosis Date   Anxiety    NO MEDS   Arthritis of ankle    Breast mass    Breast tenderness in female    3;00 x3 weeks   Chronic pain syndrome    Depression    NO MEDS   GERD (gastroesophageal reflux disease)    OCC-NO MEDS   Headache    HAS RECENTLY STARTED HAVING HA'S MORE FREQUENTLY   Obesity    OSA (obstructive sleep apnea)     Past Surgical History:  Procedure Laterality Date   KNEE ARTHROSCOPY WITH MEDIAL MENISECTOMY Right 09/07/2015   Procedure: Arthroscopic Lateral Release ;  Surgeon: Kennedy Bucker, MD;  Location: [facility] ORS;  Service: Orthopedics;  Laterality: Right;   MOUTH SURGERY  12/2018   ORIF ANKLE FRACTURE Right 02/16/2019   Procedure: OPEN REDUCTION INTERNAL FIXATION (ORIF) ANKLE FRACTURE;  Surgeon: Lyndle Herrlich, MD;  Location: [facility] ORS;  Service: Orthopedics;  Laterality: Right;    Prior to Admission medications   Medication Sig Start Date End Date Taking? Authorizing Provider  gabapentin (NEURONTIN) 300 MG capsule Take 1 capsule (300 mg total) by mouth 3 (three) times daily. 06/06/22   Candelaria Stagers, DPM  methylPREDNISolone (MEDROL DOSEPAK) 4 MG TBPK tablet Take as directed 06/06/22   Candelaria Stagers, DPM  oxyCODONE-acetaminophen (PERCOCET) 5-325 MG tablet Take 1 tablet by mouth every 4 (four) hours as needed for severe pain. 07/01/22   McDonald, Rachelle Hora, DPM  oxyCODONE-acetaminophen (PERCOCET) 5-325 MG tablet Take 1 tablet by mouth every 4 (four) hours as needed for severe pain.  07/09/22   Candelaria Stagers, DPM  oxyCODONE-acetaminophen (PERCOCET) 5-325 MG tablet Take 1 tablet by mouth every 4 (four) hours as needed for severe pain. 07/12/22   Candelaria Stagers, DPM  oxyCODONE-acetaminophen (PERCOCET) 5-325 MG tablet Take 1 tablet by mouth every 4 (four) hours as needed for severe pain. 07/18/22   Candelaria Stagers, DPM  oxyCODONE-acetaminophen (PERCOCET) 5-325 MG tablet Take 1 tablet by mouth every 4 (four) hours as needed for severe pain. 08/08/22   Candelaria Stagers, DPM  oxyCODONE-acetaminophen (PERCOCET) 5-325 MG tablet Take 1 tablet by mouth every 4 (four) hours as needed for severe pain. 08/20/22   Candelaria Stagers, DPM  oxyCODONE-acetaminophen (PERCOCET) 5-325 MG tablet Take 1 tablet by mouth every 4 (four) hours as needed for severe pain. 08/26/22   Candelaria Stagers, DPM  oxyCODONE-acetaminophen (PERCOCET) 5-325 MG tablet Take 1 tablet by mouth every 4 (four) hours as needed for severe pain. 09/05/22   Candelaria Stagers, DPM  oxyCODONE-acetaminophen (PERCOCET) 5-325 MG tablet Take 1 tablet by mouth every 4 (four) hours as needed for severe pain. 09/18/22   Candelaria Stagers, DPM  oxyCODONE-acetaminophen (PERCOCET) 5-325 MG tablet Take 1 tablet by mouth every 4 (four) hours as needed for severe pain. 10/01/22   Candelaria Stagers, DPM  oxyCODONE-acetaminophen (PERCOCET) 5-325 MG tablet Take 1 tablet  by mouth every 4 (four) hours as needed for severe pain. 10/09/22   Candelaria Stagers, DPM  oxyCODONE-acetaminophen (PERCOCET) 5-325 MG tablet Take 1 tablet by mouth every 4 (four) hours as needed for severe pain. 10/17/22   Candelaria Stagers, DPM  oxyCODONE-acetaminophen (PERCOCET) 5-325 MG tablet Take 1 tablet by mouth every 4 (four) hours as needed for severe pain. 11/04/22   Candelaria Stagers, DPM    Family History  Problem Relation Age of Onset   Breast cancer Cousin      Social History   Tobacco Use   Smoking status: Some Days    Current packs/day: 0.10    Average packs/day: 0.1 packs/day for 14.0 years  (1.4 ttl pk-yrs)    Types: Cigarettes   Smokeless tobacco: Never  Vaping Use   Vaping status: Never Used  Substance Use Topics   Alcohol use: Yes    Alcohol/week: 1.0 standard drink of alcohol    Types: 1 Cans of beer per week    Comment: sometime   Drug use: Not Currently    Types: Cocaine    Comment: 10/22/2021    Allergies as of 11/12/2022 - Review Complete 10/23/2022  Allergen Reaction Noted   Haldol [haloperidol lactate]  06/11/2012   Amoxicillin Rash 06/11/2012    Review of Systems:    All systems reviewed and negative except where noted in HPI.   Physical Exam:  LMP 09/23/2022  Patient's last menstrual period was 09/23/2022. Psych:  Alert and cooperative. Normal mood and affect. General:   Alert,  Well-developed, well-nourished, pleasant and cooperative in NAD Head:  Normocephalic and atraumatic. Eyes:  Sclera clear, no icterus.   Conjunctiva pink. Ears:  Normal auditory acuity. Neck:  Supple; no masses or thyromegaly. Lungs:  Respirations even and unlabored.  Clear throughout to auscultation.   No wheezes, crackles, or rhonchi. No acute distress. Heart:  Regular rate and rhythm; no murmurs, clicks, rubs, or gallops. Abdomen:  Normal bowel sounds.  No bruits.  Soft, non-tender and non-distended without masses, hepatosplenomegaly or hernias noted.  No guarding or rebound tenderness.    Neurologic:  Alert and oriented x3;  grossly normal neurologically. Psych:  Alert and cooperative. Normal mood and affect.  Imaging Studies: US Abdomen Limited RUQ (LIVER/GB)  Result Date: 10/23/2022 CLINICAL DATA:  Right upper quadrant pain EXAM: ULTRASOUND ABDOMEN LIMITED RIGHT UPPER QUADRANT COMPARISON:  05/09/2016 FINDINGS: Gallbladder: No gallstones or wall thickening visualized. No sonographic Murphy sign noted by sonographer. Common bile duct: Diameter: Less than 2 mm Liver: No focal lesion identified. Within normal limits in parenchymal echogenicity. Portal vein is patent on color  Doppler imaging with normal direction of blood flow towards the liver. Other: None. IMPRESSION: 1. Unremarkable right upper quadrant ultrasound. Electronically Signed   By: Sharlet Salina M.D.   On: 10/23/2022 21:46   US OB LESS THAN 14 WEEKS WITH OB TRANSVAGINAL  Result Date: 10/23/2022 CLINICAL DATA:  Lower abdominal pain. EXAM: OBSTETRIC <14 WK [place] AND TRANSVAGINAL OB US TECHNIQUE: Both transabdominal and transvaginal ultrasound examinations were performed for complete evaluation of the gestation as well as the maternal uterus, adnexal regions, and pelvic cul-de-sac. Transvaginal technique was performed to assess early pregnancy. COMPARISON:  None Available. FINDINGS: Intrauterine gestational sac: Single Yolk sac:  Not Visualized. Embryo:  Visualized. Cardiac Activity: Visualized. Heart Rate: 124 bpm CRL:  5.5 mm   6 w   2 d  [place] EDC: June 16, 2023 Subchorionic hemorrhage:  Small (1.7 cm x 0.5 cm x 0.9 cm) Maternal uterus/adnexae: The bilateral ovaries are visualized and are normal in appearance. A trace amount of free fluid is seen within the cul-de-sac. IMPRESSION: Single, viable intrauterine pregnancy at approximately 6 weeks and 2 days gestation by ultrasound evaluation. Electronically Signed   By: Aram Candela M.D.   On: 10/23/2022 19:11    Assessment and Plan:   Madison Singleton is a 32 y.o. y/o female has been referred for ***  Follow up in ***  Dr Wyline Mood MD,MRCP(U.K)    BP check ***

## 2022-11-14 ENCOUNTER — Telehealth: Payer: Self-pay | Admitting: Podiatry

## 2022-11-14 NOTE — Telephone Encounter (Signed)
Pt requested a Rx refill on pain medicine. Please advise

## 2022-11-15 ENCOUNTER — Telehealth: Payer: Self-pay | Admitting: Podiatry

## 2022-11-15 ENCOUNTER — Other Ambulatory Visit: Payer: Self-pay | Admitting: Podiatry

## 2022-11-15 MED ORDER — OXYCODONE-ACETAMINOPHEN 5-325 MG PO TABS: 1.0000 | ORAL_TABLET | ORAL | 0 refills | Status: DC | PRN

## 2022-11-15 NOTE — Telephone Encounter (Signed)
Pt would like a refill on pain medication  

## 2022-11-15 NOTE — Telephone Encounter (Signed)
Pt called stating she missed a call from our office and I checked she does not have an appt.She said it was probably about her pain medication. Checked chart and told pt pain medication was sent in.

## 2022-11-26 ENCOUNTER — Telehealth: Payer: Self-pay | Admitting: Podiatry

## 2022-11-26 NOTE — Telephone Encounter (Signed)
Pt called states she's been calling on the status of the refill of her pain medication. She said she needs a refill

## 2022-11-26 NOTE — Telephone Encounter (Signed)
pt need a refill on pain medication

## 2022-11-27 ENCOUNTER — Telehealth: Payer: Self-pay | Admitting: Podiatry

## 2022-11-27 NOTE — Telephone Encounter (Signed)
Pt called back to check to see if Dr Allena Katz had replied to the request for pain medication and upon checking the chart it says he would call it in next week and so I gave pt that information and she said ok and thank you.

## 2022-11-27 NOTE — Telephone Encounter (Signed)
Patient called following up on her pain medication. Patient states she called and hasn't heard anything.

## 2022-12-03 ENCOUNTER — Other Ambulatory Visit: Payer: Self-pay | Admitting: Podiatry

## 2022-12-03 ENCOUNTER — Telehealth: Payer: Self-pay | Admitting: Podiatry

## 2022-12-03 ENCOUNTER — Ambulatory Visit: Payer: 59 | Admitting: Podiatry

## 2022-12-03 MED ORDER — OXYCODONE-ACETAMINOPHEN 5-325 MG PO TABS: 1.0000 | ORAL_TABLET | ORAL | 0 refills | Status: DC | PRN

## 2022-12-03 NOTE — Telephone Encounter (Signed)
Pt calling for a pain medication refill and states she is going to have to reschedule her appt for today because she has no car. Please advise

## 2022-12-11 ENCOUNTER — Telehealth: Payer: Self-pay | Admitting: Podiatry

## 2022-12-11 NOTE — Telephone Encounter (Signed)
Needs a refill on her pain medication.

## 2022-12-12 NOTE — Telephone Encounter (Signed)
Pt called again asking for a refill on her pain medication

## 2022-12-12 NOTE — Telephone Encounter (Signed)
Notified pt and she said she thought it was next week but thank you

## 2022-12-17 ENCOUNTER — Telehealth: Payer: Self-pay | Admitting: Podiatry

## 2022-12-17 ENCOUNTER — Other Ambulatory Visit: Payer: Self-pay | Admitting: Podiatry

## 2022-12-17 MED ORDER — OXYCODONE-ACETAMINOPHEN 5-325 MG PO TABS
1.0000 | ORAL_TABLET | ORAL | 0 refills | Status: DC | PRN
Start: 2022-12-17 — End: 2023-02-13

## 2022-12-17 NOTE — Telephone Encounter (Signed)
Patient called for pain medication refill.

## 2022-12-31 ENCOUNTER — Ambulatory Visit: Payer: 59 | Admitting: Podiatry

## 2023-01-06 ENCOUNTER — Other Ambulatory Visit: Payer: Self-pay | Admitting: Podiatry

## 2023-01-06 ENCOUNTER — Telehealth: Payer: Self-pay | Admitting: Podiatry

## 2023-01-06 NOTE — Telephone Encounter (Signed)
Pt calling for pain medication refill.

## 2023-01-06 NOTE — Telephone Encounter (Signed)
pt called about pain medication refill

## 2023-01-07 ENCOUNTER — Telehealth: Payer: Self-pay | Admitting: Podiatry

## 2023-01-07 MED ORDER — OXYCODONE-ACETAMINOPHEN 5-325 MG PO TABS: 1.0000 | ORAL_TABLET | ORAL | 0 refills | Status: DC | PRN

## 2023-01-07 NOTE — Telephone Encounter (Signed)
Notified pt medication was sent in. She said thank you

## 2023-01-07 NOTE — Addendum Note (Signed)
Addended by: Nicholes Rough on: 01/07/2023 08:21 AM   Modules accepted: Orders

## 2023-01-07 NOTE — Telephone Encounter (Signed)
Patient called for her pain medication refill.

## 2023-01-16 ENCOUNTER — Other Ambulatory Visit: Payer: Self-pay | Admitting: Podiatry

## 2023-01-17 ENCOUNTER — Telehealth: Payer: Self-pay | Admitting: Podiatry

## 2023-01-17 ENCOUNTER — Encounter: Payer: Self-pay | Admitting: Podiatry

## 2023-01-17 MED ORDER — OXYCODONE-ACETAMINOPHEN 5-325 MG PO TABS
1.0000 | ORAL_TABLET | ORAL | 0 refills | Status: DC | PRN
Start: 2023-01-17 — End: 2023-02-13

## 2023-01-17 NOTE — Telephone Encounter (Signed)
Pt called in wanting a refill on her pain medication.

## 2023-01-17 NOTE — Addendum Note (Signed)
Addended by: Nicholes Rough on: 01/17/2023 08:12 PM   Modules accepted: Orders

## 2023-01-29 ENCOUNTER — Other Ambulatory Visit: Payer: Self-pay | Admitting: Podiatry

## 2023-01-30 ENCOUNTER — Other Ambulatory Visit: Payer: Self-pay | Admitting: Podiatry

## 2023-01-30 ENCOUNTER — Telehealth: Payer: Self-pay | Admitting: Podiatry

## 2023-01-30 MED ORDER — OXYCODONE-ACETAMINOPHEN 5-325 MG PO TABS
1.0000 | ORAL_TABLET | ORAL | 0 refills | Status: DC | PRN
Start: 2023-01-30 — End: 2023-02-13

## 2023-01-30 NOTE — Telephone Encounter (Signed)
Patient called asking for RX refill for pain meds

## 2023-02-12 ENCOUNTER — Other Ambulatory Visit: Payer: Self-pay | Admitting: Podiatry

## 2023-02-12 NOTE — Telephone Encounter (Signed)
Patient called for her refill of pain medication

## 2023-02-12 NOTE — Telephone Encounter (Signed)
Pt called asking for a refill on her pain medication

## 2023-02-13 ENCOUNTER — Telehealth: Payer: Self-pay | Admitting: Podiatry

## 2023-02-13 ENCOUNTER — Other Ambulatory Visit: Payer: Self-pay | Admitting: Podiatry

## 2023-02-13 MED ORDER — OXYCODONE-ACETAMINOPHEN 5-325 MG PO TABS
1.0000 | ORAL_TABLET | ORAL | 0 refills | Status: AC | PRN
Start: 2023-02-13 — End: 2023-02-18

## 2023-02-13 NOTE — Telephone Encounter (Signed)
Patient needs refill of oxycodone.  I let her know Dr. Allena Katz would be back in the office next Tuesday and she ask if someone else will fill the prescription.

## 2023-02-13 NOTE — Telephone Encounter (Signed)
Patient would like a refill on the oxy  Thanks!

## 2023-02-13 NOTE — Telephone Encounter (Signed)
Dr. Lilian Kapur was kind enough to send this to the pharmacy for the patient today. Thanks

## 2023-02-17 ENCOUNTER — Ambulatory Visit: Payer: 59 | Admitting: Podiatry

## 2023-03-03 ENCOUNTER — Telehealth: Payer: Self-pay | Admitting: Podiatry

## 2023-03-03 NOTE — Telephone Encounter (Signed)
Patient needs a refill of her pain meds

## 2023-03-04 ENCOUNTER — Telehealth: Payer: Self-pay | Admitting: Podiatry

## 2023-03-04 ENCOUNTER — Other Ambulatory Visit: Payer: Self-pay | Admitting: Podiatry

## 2023-03-04 MED ORDER — OXYCODONE-ACETAMINOPHEN 5-325 MG PO TABS
1.0000 | ORAL_TABLET | ORAL | 0 refills | Status: DC | PRN
Start: 2023-03-04 — End: 2023-03-25

## 2023-03-04 NOTE — Telephone Encounter (Signed)
Pt called to see if you had responded to her request for pain medication from yesterday. I did explain you are not in the office on Mondays( surgery day) and she was aware. I also explained that you have a full day in clinic today but would let you know she has called again.

## 2023-03-05 NOTE — Telephone Encounter (Signed)
Lvm for pt that medication was sent in.

## 2023-03-11 ENCOUNTER — Encounter: Payer: Self-pay | Admitting: Emergency Medicine

## 2023-03-11 ENCOUNTER — Emergency Department
Admission: EM | Admit: 2023-03-11 | Discharge: 2023-03-11 | Disposition: A | Discharge status: Home / Self Care | Payer: 59 | Attending: Emergency Medicine | Admitting: Emergency Medicine

## 2023-03-11 ENCOUNTER — Other Ambulatory Visit: Payer: Self-pay

## 2023-03-11 DIAGNOSIS — S0990XA Unspecified injury of head, initial encounter: Secondary | ICD-10-CM | POA: Diagnosis present

## 2023-03-11 DIAGNOSIS — M542 Cervicalgia: Secondary | ICD-10-CM | POA: Insufficient documentation

## 2023-03-11 MED ORDER — TRAMADOL HCL 50 MG PO TABS
50.0000 mg | ORAL_TABLET | Freq: Once | ORAL | Status: AC
Start: 2023-03-11 — End: 2023-03-11
  Administered 2023-03-11: 50 mg via ORAL
  Filled 2023-03-11: qty 1

## 2023-03-11 MED ORDER — NAPROXEN 500 MG PO TABS: 500.0000 mg | ORAL_TABLET | Freq: Three times a day (TID) | ORAL | 2 refills | Status: DC | PRN

## 2023-03-11 MED ORDER — CYCLOBENZAPRINE HCL 5 MG PO TABS: 5.0000 mg | ORAL_TABLET | Freq: Three times a day (TID) | ORAL | 0 refills | Status: DC | PRN
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
## 2023-03-11 NOTE — Discharge Instructions (Signed)
Please seek medical attention for any high fevers, chest pain, shortness of breath, change in behavior, persistent vomiting, bloody stool or any other new or concerning symptoms.  
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
## 2023-03-13 ENCOUNTER — Encounter: Payer: Self-pay | Admitting: Podiatry

## 2023-03-13 ENCOUNTER — Ambulatory Visit (INDEPENDENT_AMBULATORY_CARE_PROVIDER_SITE_OTHER): Payer: 59 | Admitting: Podiatry

## 2023-03-13 VITALS — Ht 61.0 in | Wt 164.9 lb

## 2023-03-13 DIAGNOSIS — M19071 Primary osteoarthritis, right ankle and foot: Secondary | ICD-10-CM | POA: Diagnosis not present

## 2023-03-13 DIAGNOSIS — Z01818 Encounter for other preprocedural examination: Secondary | ICD-10-CM | POA: Diagnosis not present

## 2023-03-13 MED ORDER — OXYCODONE-ACETAMINOPHEN 5-325 MG PO TABS: 1.0000 | ORAL_TABLET | ORAL | 0 refills | Status: DC | PRN

## 2023-03-13 NOTE — Progress Notes (Signed)
Subjective:  Patient ID: Madison Singleton, female    DOB: 08/09/1990,  MRN: [id number]  Chief Complaint  Patient presents with   Ankle Pain    Pt here to f/u on right ankle pain, Pt states pain is still there pain level is a 9, hurts to walk a lot of the times bc of pain.    32 y.o. female presents with the above complaint.  Patient presents with follow-up of right continuous ankle pain.  She states that she got about a year of relief from previous ankle arthroscopy.  She states the pain started to get worse.  She wants to know if she can do another ankle arthroscopy.  Denies any other acute complaints  Review of Systems: Negative except as noted in the HPI. Denies N/V/F/Ch.  Past Medical History:  Diagnosis Date   Anxiety    NO MEDS   Arthritis of ankle    Breast mass    Breast tenderness in female    3;00 x3 weeks   Chronic pain syndrome    Depression    NO MEDS   GERD (gastroesophageal reflux disease)    OCC-NO MEDS   Headache    HAS RECENTLY STARTED HAVING HA'S MORE FREQUENTLY   Obesity    OSA (obstructive sleep apnea)     Current Outpatient Medications:    cyclobenzaprine (FLEXERIL) 5 MG tablet, Take 1 tablet (5 mg total) by mouth 3 (three) times daily as needed (neck pain)., Disp: 15 tablet, Rfl: 0   gabapentin (NEURONTIN) 300 MG capsule, Take 1 capsule (300 mg total) by mouth 3 (three) times daily., Disp: 90 capsule, Rfl: 3   methylPREDNISolone (MEDROL DOSEPAK) 4 MG TBPK tablet, Take as directed, Disp: 21 each, Rfl: 0   naproxen (NAPROSYN) 500 MG tablet, Take 1 tablet (500 mg total) by mouth 3 (three) times daily as needed for moderate pain (pain score 4-6)., Disp: 20 tablet, Rfl: 2   oxyCODONE-acetaminophen (PERCOCET) 5-325 MG tablet, Take 1 tablet by mouth every 4 (four) hours as needed for severe pain (pain score 7-10)., Disp: 30 tablet, Rfl: 0   oxyCODONE-acetaminophen (PERCOCET) 5-325 MG tablet, Take 1 tablet by mouth every 4 (four) hours as needed for severe pain  (pain score 7-10)., Disp: 30 tablet, Rfl: 0  Social History   Tobacco Use  Smoking Status Some Days   Current packs/day: 0.10   Average packs/day: 0.1 packs/day for 14.0 years (1.4 ttl pk-yrs)   Types: Cigarettes  Smokeless Tobacco Never    Allergies  Allergen Reactions   Haldol [Haloperidol Lactate]     Throat swelling   Amoxicillin Rash    Did it involve swelling of the face/tongue/throat, SOB, or low BP? Unknown Did it involve sudden or severe rash/hives, skin peeling, or any reaction on the inside of your mouth or nose? Yes Did you need to seek medical attention at a hospital or doctor's office? Yes When did it last happen? Childhood reaction. If all above answers are "NO", may proceed with cephalosporin use.    Objective:   There were no vitals filed for this visit.  Body mass index is 31.16 kg/m. Constitutional Well developed. Well nourished.  Vascular Dorsalis pedis pulses palpable bilaterally. Posterior tibial pulses palpable bilaterally. Capillary refill normal to all digits.  No cyanosis or clubbing noted. Pedal hair growth normal.  Neurologic Normal speech. Oriented to person, place, and time. Epicritic sensation to light touch grossly present bilaterally.  Dermatologic Nails well groomed and normal in appearance. No open wounds.  No skin lesions.  Orthopedic: Pain on palpation of right ankle pain with range of motion ankle joint mild crepitus clinically appreciated.  Deep intra-articular ankle pain noted.  No pain at the subtalar joint.   Radiographs: 3 views of skeletally mature adult right ankle.  Osteochondral lesion is appreciated on the medial talar dome osteoarthritic changes noted at the ankle joint.  Syndesmosis fusion also noted.  This is likely due to previous ankle fracture Assessment:   1. Arthritis of right ankle   2. Encounter for preoperative examination for general surgical procedure     Plan:  Patient was evaluated and treated and all  questions answered.  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  At this time given her age she will benefit from another ankle arthroscopy with micro drilling of the osteochondral lesion.  I will again evaluate the amount of arthritis that is that is present.  Ultimately patient will need an ankle fusion/ankle implant however given her age and history of chronic pain.  We will hold off on more aggressive option for now.  We will plan on doing an ankle arthroscopy to buy time again as the previous one was done about a year ago.  Since this gave her about 1 year of relief I will plan on doing it again.  I discussed with patient she states understand like to proceed with ankle arthroscopy with intervention -She can be weightbearing as tolerated cam boot after the surgery -Informed surgical risk consent was reviewed and read aloud to the patient.  I reviewed the films.  I have discussed my findings with the patient in great detail.  I have discussed all risks including but not limited to infection, stiffness, scarring, limp, disability, deformity, damage to blood vessels and nerves, numbness, poor healing, need for braces, arthritis, chronic pain, amputation, death.  All benefits and realistic expectations discussed in great detail.  I have made no promises as to the outcome.  I have provided realistic expectations.  I have offered the patient a 2nd opinion, which they have declined and assured me they preferred to proceed despite the risks -A steroid injection was performed at right ankle joint using 1% plain Lidocaine and 10 mg of Kenalog. This was well tolerated.  I will   No follow-ups on file.

## 2023-03-25 ENCOUNTER — Telehealth: Payer: Self-pay | Admitting: Podiatry

## 2023-03-25 ENCOUNTER — Telehealth: Payer: Self-pay | Admitting: Urology

## 2023-03-25 MED ORDER — OXYCODONE-ACETAMINOPHEN 5-325 MG PO TABS: 1.0000 | ORAL_TABLET | ORAL | 0 refills | Status: DC | PRN

## 2023-03-25 NOTE — Telephone Encounter (Signed)
Patient called asking for her refill of pain medication. Pt uses [facility] [address] [facility]

## 2023-03-25 NOTE — Telephone Encounter (Signed)
Called pt and LM to call back when she is ready to schedule her surgery with Dr. Allena Katz.

## 2023-03-25 NOTE — Telephone Encounter (Signed)
PDMP reviewed and refill sent

## 2023-03-25 NOTE — Addendum Note (Signed)
Addended byLilian Kapur, Othel Hoogendoorn R on: 03/25/2023 02:31 PM   Modules accepted: Orders

## 2023-04-01 ENCOUNTER — Telehealth: Payer: Self-pay | Admitting: Podiatry

## 2023-04-01 NOTE — Telephone Encounter (Signed)
 Pt called asking for a refill on her pain medication to be sent in.

## 2023-04-02 ENCOUNTER — Telehealth: Payer: Self-pay | Admitting: Urology

## 2023-04-02 NOTE — Telephone Encounter (Signed)
Pt called asking for a refill on pain medication 

## 2023-04-03 NOTE — Telephone Encounter (Signed)
 Pt called again checking to see if Dr Allena Katz had sent in her pain medication

## 2023-04-07 ENCOUNTER — Telehealth: Payer: Self-pay | Admitting: Podiatry

## 2023-04-07 ENCOUNTER — Telehealth: Payer: Self-pay

## 2023-04-07 NOTE — Telephone Encounter (Signed)
 Patient calling for her medication refill.

## 2023-04-07 NOTE — Telephone Encounter (Signed)
 Patient called again and left a message - asking for pain medicine refill

## 2023-04-08 MED ORDER — OXYCODONE-ACETAMINOPHEN 5-325 MG PO TABS: 1.0000 | ORAL_TABLET | ORAL | 0 refills | Status: DC | PRN

## 2023-04-08 NOTE — Addendum Note (Signed)
 Addended by: Nicholes Rough on: 04/08/2023 08:14 AM   Modules accepted: Orders

## 2023-04-14 ENCOUNTER — Telehealth: Payer: Self-pay | Admitting: Podiatry

## 2023-04-14 NOTE — Telephone Encounter (Signed)
DOS-05/12/23  ANKLE ARTHROSCOPY [id number]  [facility] EFFECTIVE DATE- 01/24/20  SPOKE WITH GENEVIEVRE G. FROM [facility] AND SHE STATED THAT PRIOR AUTH IS NOT REQUIRED FOR CPT CODE 45409.  CALL REF #Charline Bills - [id number]   [facility] EFFECTIVE DATE- 03/26/23  DEDUCTIBLE- $0.00 WITH REMAINING $0.00 OOP-$9200.00 WITH REMAINING $9,152.90  COINSURANCE- 0%  SPOKE WITH MEL M FROM [facility] AND SHE STATED THAT PRIOR AUTH IS NOT REQUIRED FOR CPT CODE 95621.  CALL REF #: [id number]

## 2023-04-17 ENCOUNTER — Telehealth: Payer: Self-pay | Admitting: Podiatry

## 2023-04-17 NOTE — Telephone Encounter (Signed)
Pt calling for a pain medication refill to be sent in.

## 2023-04-18 ENCOUNTER — Telehealth: Payer: Self-pay | Admitting: Podiatry

## 2023-04-18 NOTE — Telephone Encounter (Signed)
Pt called again requesting refill on pain medication.

## 2023-04-18 NOTE — Telephone Encounter (Signed)
Notified pt that per Dr Allena Katz it was filled last week. I will fill in next 2 weeks .

## 2023-04-21 ENCOUNTER — Telehealth: Payer: Self-pay | Admitting: Podiatry

## 2023-04-21 NOTE — Telephone Encounter (Signed)
Pt calling for pain medication refill to be sent in.

## 2023-04-22 MED ORDER — OXYCODONE-ACETAMINOPHEN 5-325 MG PO TABS: 1.0000 | ORAL_TABLET | ORAL | 0 refills | Status: DC | PRN

## 2023-04-22 NOTE — Telephone Encounter (Signed)
Patient is requesting refill for pain medication.(Oxycodone)

## 2023-04-29 NOTE — Progress Notes (Signed)
 Chief Complaint: Chief Complaint  Patient presents with  . Back Pain      HPI: Patient is a pleasant 33 y.o. female seen in consultation at the request of Dr. Frederic Don for the evaluation of thoracic spine pain which radiates outwards bilaterally.  Pain has been going on for years.  She rates her pain as a 9/10.  Is constant, stiff, aching.  She denies any numbness, tingling, weakness or loss of control of bowel or bladder.  She does feel like it has been getting worse.  Pain is worse with standing, walking, bending, lifting, stairs, sitting and better with laying down, rest and heat.  She has tried Flexeril , Percocet and gabapentin  but pain is not controlled.  She has also tried physical therapy which made her pain worse.  She also complains of blurred vision, double vision, heartburn, nausea, frequent urination, headaches, depression and sleep disorder.    Review of Systems: A 10 point review of systems is negative, except for the pertinent positives and negatives detailed in the HPI.  PMH: Past Medical History:  Diagnosis Date  . Anxiety   . Bipolar disorder (CMS/HHS-HCC)   . Insomnia   . PTSD (post-traumatic stress disorder)   . Substance abuse (CMS/HHS-HCC)    Cocaine abuse 2014  . Tobacco abuse      PSH: Past Surgical History:  Procedure Laterality Date  . Arthroscopic lateral release right and partial synovectomy Right 09/07/2015   Dr.Menz     Family History: Family History  Problem Relation Name Age of Onset  . No Known Problems Mother    . No Known Problems Father    . Diabetes Maternal Grandmother         does not think she uses insulin  . Cancer Paternal Grandmother         unsure of what kind  . Cancer Maternal Aunt         unsure of what kind     Social History: Social History   Socioeconomic History  . Marital status: Married    Spouse name: Rutha Bihari  . Number of children: 6  . Highest education level: 9th grade  Occupational History  .  Occupation: Unemployed  Tobacco Use  . Smoking status: Every Day    Types: Cigarettes  . Smokeless tobacco: Never  . Tobacco comments:    1-2 cigarettes a day  Vaping Use  . Vaping status: Never Used  Substance and Sexual Activity  . Alcohol use: Yes    Comment: Occasional  . Drug use: Not Currently    Types: Cocaine    Comment: Stopped in 2017  . Sexual activity: Yes    Partners: Male    Birth control/protection: None   Social Drivers of Health   Financial Resource Strain: Low Risk  (04/29/2023)   Overall Financial Resource Strain (CARDIA)   . Difficulty of Paying Living Expenses: Not very hard  Recent Concern: Financial Resource Strain - High Risk (04/29/2023)   Overall Financial Resource Strain (CARDIA)   . Difficulty of Paying Living Expenses: Very hard  Food Insecurity: No Food Insecurity (04/29/2023)   Hunger Vital Sign   . Worried About Programme Researcher, Broadcasting/film/video in the Last Year: Never true   . Ran Out of Food in the Last Year: Never true  Transportation Needs: No Transportation Needs (04/29/2023)   PRAPARE - Transportation   . Lack of Transportation (Medical): No   . Lack of Transportation (Non-Medical): No     Allergies:  Allergies  Allergen Reactions  . Haldol [Haloperidol] Anaphylaxis    Throat swelling  . Amoxicillin Rash    rash     Medications:  Current Outpatient Medications:  .  oxyCODONE -acetaminophen  (PERCOCET) 5-325 mg tablet, Take 1 tablet by mouth every 4 (four) hours as needed for Pain, Disp: , Rfl:  .  tiZANidine (ZANAFLEX) 4 MG tablet, Take 1/2 to 1 tablet p.o. twice daily as needed muscle spasm, Disp: 60 tablet, Rfl: 1  Vitals: Vitals:   04/29/23 1104  BP: 100/64  Pulse: 66  Temp: 36.1 C (97 F)  TempSrc: Oral  Weight: 71.7 kg (158 lb)  Height: 154.9 cm (5' 1)  PainSc:   9  PainLoc: Back     Physical Exam: Constitutional: vital signs reviewed, healthy appearing  Psych: normal affect and mood, pain behaviors consistent throughout exam,  appropriate judgement and insight  Skin: no rashes or lesions, on palpation there are no palpable nodules or areas of induration  Resp: even and unlabored, no use of accessory muscles, no tactile fremitis  Cardiac: no clubbing, cyanosis, edema  GI:  nondistended, nontender  MSK: normal inspection lumbar spine, + TTP over thoracic spinous processes between shoulder blades, pain with lumbar flexion but not extension, full range of motion of cervical spine, negative Spurling sign bilaterally, negative seated slump bilaterally Neuro: sensation to soft touch intact BL LE and upper extremities, motor exam 5/5 both legs and arms, reflexes 2+ BL patella, biceps, triceps; negative Hoffmann sign bilaterally  Imaging: No MRI  Platelet count 438 drawn on 10/23/2022 GFR greater than 60 drawn on 10/23/2022  Assessment: Chronic thoracic spine pain Bilateral thoracic radiculitis  Left ankle surgery x 3 History of PTSD History of cocaine abuse History of bipolar disorder  Plan: I did review the referring physician's note as well as the CBC and CMP from 10/23/2022.  She did see Dr. Dow for left hand pain he did give her a brace and discussed anti-inflammatory medications.  She is to follow back up with him 2 weeks.  X-rays were also done.I did check the [place]  controlled substance database.  The patient has been on Percocet since at least July 2024.  This is being prescribed by Dr. Franky Blanch in [place] .  The patient reports having done physical therapy at least 8 weeks over the past 3 to 4 months which made her pain worse so she had to discontinue it.  She is also been on gabapentin , Percocet, Flexeril  but pain was not controlled with this.  I do feel that the neck step would be getting an MRI of the thoracic spine for possible epidural injections which I did discuss with her.  She is in agreement with this.  MRI thoracic spine ordered.  Patient would like to try another muscle  relaxer.  Prescription for tizanidine sent to pharmacy.  Of note patient is scheduled next week for her fourth left ankle surgery at outside facility.  Follow-up after MRI thoracic spine.  Would consider epidural injections.  Patient agrees with above plan.  Answered all questions.

## 2023-05-02 ENCOUNTER — Telehealth: Payer: Self-pay | Admitting: Podiatry

## 2023-05-02 ENCOUNTER — Other Ambulatory Visit: Payer: Self-pay | Admitting: Physical Medicine & Rehabilitation

## 2023-05-02 DIAGNOSIS — M5414 Radiculopathy, thoracic region: Secondary | ICD-10-CM

## 2023-05-02 NOTE — Telephone Encounter (Signed)
 Pt calling for pain medication refill to be sent in.

## 2023-05-02 NOTE — Telephone Encounter (Signed)
 Patient called for pain medication to be sent it to pharmacy.

## 2023-05-05 ENCOUNTER — Other Ambulatory Visit: Payer: Self-pay | Admitting: Podiatry

## 2023-05-05 ENCOUNTER — Telehealth: Payer: Self-pay | Admitting: Podiatry

## 2023-05-05 NOTE — Telephone Encounter (Signed)
 Pt called again for her pain medication refill.

## 2023-05-06 ENCOUNTER — Other Ambulatory Visit: Payer: Self-pay | Admitting: Podiatry

## 2023-05-06 ENCOUNTER — Encounter: Payer: Self-pay | Admitting: Podiatry

## 2023-05-06 ENCOUNTER — Telehealth: Payer: Self-pay | Admitting: Podiatry

## 2023-05-06 MED ORDER — OXYCODONE-ACETAMINOPHEN 5-325 MG PO TABS: 1.0000 | ORAL_TABLET | ORAL | 0 refills | Status: DC | PRN

## 2023-05-06 NOTE — Telephone Encounter (Signed)
Pt called checking to see if pain medication refill was sent in.

## 2023-05-06 NOTE — Telephone Encounter (Signed)
Notified pt per Dr Allena Katz pain medication will be sent in next week. She said next week and said she was going to send a message on mychart.

## 2023-05-12 ENCOUNTER — Other Ambulatory Visit: Payer: Self-pay | Admitting: Podiatry

## 2023-05-12 MED ORDER — OXYCODONE-ACETAMINOPHEN 5-325 MG PO TABS: 1.0000 | ORAL_TABLET | ORAL | 0 refills | Status: DC | PRN

## 2023-05-12 MED ORDER — IBUPROFEN 800 MG PO TABS: 800.0000 mg | ORAL_TABLET | Freq: Four times a day (QID) | ORAL | 1 refills | Status: DC | PRN

## 2023-05-14 ENCOUNTER — Ambulatory Visit
Admission: RE | Admit: 2023-05-14 | Discharge: 2023-05-14 | Disposition: A | Discharge status: Home / Self Care | Payer: 59 | Source: Ambulatory Visit | Attending: Physical Medicine & Rehabilitation | Admitting: Physical Medicine & Rehabilitation

## 2023-05-14 ENCOUNTER — Telehealth: Payer: Self-pay

## 2023-05-14 DIAGNOSIS — M5414 Radiculopathy, thoracic region: Secondary | ICD-10-CM

## 2023-05-14 NOTE — Telephone Encounter (Signed)
Spoke to [place] about surgery on 06/16/2023. Per Dr. Allena Katz if she is a no show for this surgery, she will not be rescheduled and she will be discharged from the practice. She stated she understood.

## 2023-05-20 ENCOUNTER — Encounter: Payer: 59 | Admitting: Podiatry

## 2023-05-26 ENCOUNTER — Other Ambulatory Visit: Payer: Self-pay | Admitting: Podiatry

## 2023-05-27 ENCOUNTER — Other Ambulatory Visit: Payer: Self-pay | Admitting: Podiatry

## 2023-05-28 ENCOUNTER — Telehealth: Payer: Self-pay | Admitting: Podiatry

## 2023-05-28 ENCOUNTER — Other Ambulatory Visit: Payer: Self-pay

## 2023-05-28 DIAGNOSIS — Z9889 Other specified postprocedural states: Secondary | ICD-10-CM

## 2023-05-28 DIAGNOSIS — M19071 Primary osteoarthritis, right ankle and foot: Secondary | ICD-10-CM

## 2023-05-28 DIAGNOSIS — G894 Chronic pain syndrome: Secondary | ICD-10-CM

## 2023-05-28 NOTE — Telephone Encounter (Signed)
 Patient is requesting refill for oxycodone. Patient telephone number, [phone]

## 2023-05-29 ENCOUNTER — Other Ambulatory Visit: Payer: Self-pay | Admitting: Podiatry

## 2023-05-29 MED ORDER — OXYCODONE-ACETAMINOPHEN 5-325 MG PO TABS: 1.0000 | ORAL_TABLET | ORAL | 0 refills | Status: DC | PRN

## 2023-05-29 NOTE — Telephone Encounter (Signed)
Pt called for pain medication refill. 

## 2023-05-30 ENCOUNTER — Encounter: Payer: Self-pay | Admitting: Podiatry

## 2023-06-03 ENCOUNTER — Ambulatory Visit: Payer: Self-pay

## 2023-06-03 ENCOUNTER — Encounter: Payer: 59 | Admitting: Podiatry

## 2023-06-03 DIAGNOSIS — K314 Gastric diverticulum: Secondary | ICD-10-CM | POA: Diagnosis not present

## 2023-06-03 DIAGNOSIS — K3189 Other diseases of stomach and duodenum: Secondary | ICD-10-CM | POA: Diagnosis not present

## 2023-06-03 DIAGNOSIS — K2960 Other gastritis without bleeding: Secondary | ICD-10-CM | POA: Diagnosis not present

## 2023-06-16 ENCOUNTER — Encounter: Payer: Self-pay | Admitting: Podiatry

## 2023-06-16 ENCOUNTER — Other Ambulatory Visit: Payer: Self-pay | Admitting: Podiatry

## 2023-06-16 DIAGNOSIS — M19071 Primary osteoarthritis, right ankle and foot: Secondary | ICD-10-CM | POA: Diagnosis not present

## 2023-06-16 MED ORDER — OXYCODONE-ACETAMINOPHEN 5-325 MG PO TABS: 1.0000 | ORAL_TABLET | ORAL | 0 refills | Status: DC | PRN

## 2023-06-16 MED ORDER — IBUPROFEN 800 MG PO TABS: 800.0000 mg | ORAL_TABLET | Freq: Four times a day (QID) | ORAL | 1 refills | Status: DC | PRN

## 2023-06-20 ENCOUNTER — Other Ambulatory Visit: Payer: Self-pay | Admitting: Podiatry

## 2023-06-21 ENCOUNTER — Other Ambulatory Visit: Payer: Self-pay | Admitting: Podiatry

## 2023-06-23 ENCOUNTER — Telehealth: Payer: Self-pay | Admitting: Podiatry

## 2023-06-23 NOTE — Telephone Encounter (Signed)
 Patient called and would like a prescription refill of pain medication. Thank you.

## 2023-06-24 ENCOUNTER — Ambulatory Visit (INDEPENDENT_AMBULATORY_CARE_PROVIDER_SITE_OTHER): Payer: 59 | Admitting: Podiatry

## 2023-06-24 DIAGNOSIS — M19071 Primary osteoarthritis, right ankle and foot: Secondary | ICD-10-CM

## 2023-06-24 DIAGNOSIS — Z9889 Other specified postprocedural states: Secondary | ICD-10-CM

## 2023-06-24 MED ORDER — OXYCODONE-ACETAMINOPHEN 5-325 MG PO TABS: 1.0000 | ORAL_TABLET | ORAL | 0 refills | Status: DC | PRN

## 2023-06-24 NOTE — Progress Notes (Signed)
 Subjective:  Patient ID: Madison Singleton, female    DOB: Mar 09, 1991,  MRN: [id number]  Chief Complaint  Patient presents with   Routine Post Op    DOS 06/16/23 --- RIGHT ANKLE ARTHROSCOPY WITH INTERVENTION    DOS: 05/27/2022 Procedure: Right ankle arthroscopy  33 y.o. female returns for post-op check.  Patient states that she is doing well.  Some pain.  She ran out of pain medication.  She has a hard time walking.  She denies any other acute complaints  Review of Systems: Negative except as noted in the HPI. Denies N/V/F/Ch.  Past Medical History:  Diagnosis Date   Anxiety    NO MEDS   Arthritis of ankle    Breast mass    Breast tenderness in female    3;00 x3 weeks   Chronic pain syndrome    Depression    NO MEDS   GERD (gastroesophageal reflux disease)    OCC-NO MEDS   Headache    HAS RECENTLY STARTED HAVING HA'S MORE FREQUENTLY   Obesity    OSA (obstructive sleep apnea)     Current Outpatient Medications:    oxyCODONE-acetaminophen (PERCOCET) 5-325 MG tablet, Take 1 tablet by mouth every 4 (four) hours as needed for severe pain (pain score 7-10)., Disp: 30 tablet, Rfl: 0   cyclobenzaprine (FLEXERIL) 5 MG tablet, Take 1 tablet (5 mg total) by mouth 3 (three) times daily as needed (neck pain)., Disp: 15 tablet, Rfl: 0   gabapentin (NEURONTIN) 300 MG capsule, Take 1 capsule (300 mg total) by mouth 3 (three) times daily., Disp: 90 capsule, Rfl: 3   ibuprofen (ADVIL) 800 MG tablet, Take 1 tablet (800 mg total) by mouth every 6 (six) hours as needed., Disp: 60 tablet, Rfl: 1   ibuprofen (ADVIL) 800 MG tablet, Take 1 tablet (800 mg total) by mouth every 6 (six) hours as needed., Disp: 60 tablet, Rfl: 1   methylPREDNISolone (MEDROL DOSEPAK) 4 MG TBPK tablet, Take as directed, Disp: 21 each, Rfl: 0   naproxen (NAPROSYN) 500 MG tablet, Take 1 tablet (500 mg total) by mouth 3 (three) times daily as needed for moderate pain (pain score 4-6)., Disp: 20 tablet, Rfl: 2    oxyCODONE-acetaminophen (PERCOCET) 5-325 MG tablet, Take 1 tablet by mouth every 4 (four) hours as needed for severe pain (pain score 7-10)., Disp: 30 tablet, Rfl: 0   oxyCODONE-acetaminophen (PERCOCET) 5-325 MG tablet, Take 1 tablet by mouth every 4 (four) hours as needed for severe pain (pain score 7-10)., Disp: 30 tablet, Rfl: 0   oxyCODONE-acetaminophen (PERCOCET) 5-325 MG tablet, Take 1 tablet by mouth every 4 (four) hours as needed for severe pain (pain score 7-10)., Disp: 30 tablet, Rfl: 0   oxyCODONE-acetaminophen (PERCOCET) 5-325 MG tablet, Take 1 tablet by mouth every 4 (four) hours as needed for severe pain (pain score 7-10)., Disp: 30 tablet, Rfl: 0   oxyCODONE-acetaminophen (PERCOCET) 5-325 MG tablet, Take 1 tablet by mouth every 4 (four) hours as needed for severe pain (pain score 7-10)., Disp: 30 tablet, Rfl: 0   oxyCODONE-acetaminophen (PERCOCET) 5-325 MG tablet, Take 1 tablet by mouth every 4 (four) hours as needed for severe pain (pain score 7-10)., Disp: 30 tablet, Rfl: 0   oxyCODONE-acetaminophen (PERCOCET) 5-325 MG tablet, Take 1 tablet by mouth every 4 (four) hours as needed for severe pain (pain score 7-10)., Disp: 30 tablet, Rfl: 0  Social History   Tobacco Use  Smoking Status Some Days   Current packs/day: 0.10   Average  packs/day: 0.1 packs/day for 14.0 years (1.4 ttl pk-yrs)   Types: Cigarettes  Smokeless Tobacco Never    Allergies  Allergen Reactions   Haldol [Haloperidol Lactate]     Throat swelling   Amoxicillin Rash    Did it involve swelling of the face/tongue/throat, SOB, or low BP? Unknown Did it involve sudden or severe rash/hives, skin peeling, or any reaction on the inside of your mouth or nose? Yes Did you need to seek medical attention at a hospital or doctor's office? Yes When did it last happen? Childhood reaction. If all above answers are "NO", may proceed with cephalosporin use.    Objective:  There were no vitals filed for this visit. There  is no height or weight on file to calculate BMI. Constitutional Well developed. Well nourished.  Vascular Foot warm and well perfused. Capillary refill normal to all digits.   Neurologic Normal speech. Oriented to person, place, and time. Epicritic sensation to light touch grossly present bilaterally.  Dermatologic Skin healing well without signs of infection. Skin edges well coapted without signs of infection.  Orthopedic: Tenderness to palpation noted about the surgical site.   Radiographs: None Assessment:   1. Arthritis of right ankle   2. Post-operative state     Plan:  Patient was evaluated and treated and all questions answered.  S/p foot surgery right -Progressing as expected post-operatively. -XR: See above -WB Status: Weightbearing as tolerated in cam boot -Sutures: Intact.  No clinical signs of Deis is noted no complication noted. -Medications: Percocet, Medrol Dosepak, gabapentin -Foot redressed.  No follow-ups on file.

## 2023-06-28 ENCOUNTER — Other Ambulatory Visit: Payer: Self-pay | Admitting: Podiatry

## 2023-06-30 ENCOUNTER — Other Ambulatory Visit: Payer: Self-pay | Admitting: Podiatry

## 2023-06-30 MED ORDER — OXYCODONE-ACETAMINOPHEN 5-325 MG PO TABS: 1.0000 | ORAL_TABLET | ORAL | 0 refills | Status: DC | PRN

## 2023-06-30 NOTE — Progress Notes (Signed)
 Patient called stating she is out of pain medication and needs a refill.

## 2023-07-03 ENCOUNTER — Telehealth: Payer: Self-pay | Admitting: Podiatry

## 2023-07-03 ENCOUNTER — Other Ambulatory Visit: Payer: Self-pay | Admitting: Podiatry

## 2023-07-03 MED ORDER — OXYCODONE-ACETAMINOPHEN 5-325 MG PO TABS: 1.0000 | ORAL_TABLET | ORAL | 0 refills | Status: DC | PRN

## 2023-07-03 NOTE — Telephone Encounter (Signed)
Patient needs a refill of pain medication

## 2023-07-08 ENCOUNTER — Encounter: Payer: 59 | Admitting: Podiatry

## 2023-07-10 ENCOUNTER — Telehealth: Payer: Self-pay | Admitting: Podiatry

## 2023-07-10 ENCOUNTER — Encounter: Admitting: Podiatry

## 2023-07-10 NOTE — Telephone Encounter (Signed)
 Pt calling to see if her refill has been called in. She confirmed she got medication on 4/10 but was only a 5 day supply.

## 2023-07-10 NOTE — Telephone Encounter (Signed)
 Patient is requesting refill on oxycodone, Patient contact telephone number, [phone]

## 2023-07-14 ENCOUNTER — Encounter

## 2023-07-15 ENCOUNTER — Ambulatory Visit (INDEPENDENT_AMBULATORY_CARE_PROVIDER_SITE_OTHER): Admitting: Podiatry

## 2023-07-15 DIAGNOSIS — M19071 Primary osteoarthritis, right ankle and foot: Secondary | ICD-10-CM

## 2023-07-15 DIAGNOSIS — Z9889 Other specified postprocedural states: Secondary | ICD-10-CM

## 2023-07-15 MED ORDER — OXYCODONE-ACETAMINOPHEN 5-325 MG PO TABS: 1.0000 | ORAL_TABLET | ORAL | 0 refills | Status: DC | PRN

## 2023-07-15 NOTE — Progress Notes (Signed)
 Subjective:  Patient ID: Madison Singleton, female    DOB: Feb 09, 1991,  MRN: [id number]  Chief Complaint  Patient presents with   Routine Post Op    DOS 06/16/23 --- RIGHT ANKLE ARTHROSCOPY WITH INTERVENTION    DOS: 05/27/2022 Procedure: Right ankle arthroscopy  33 y.o. female returns for post-op check.  Patient states that she is doing well.  Some pain.  She ran out of pain medication.  She has a hard time walking.  She denies any other acute complaints  Review of Systems: Negative except as noted in the HPI. Denies N/V/F/Ch.  Past Medical History:  Diagnosis Date   Anxiety    NO MEDS   Arthritis of ankle    Breast mass    Breast tenderness in female    3;00 x3 weeks   Chronic pain syndrome    Depression    NO MEDS   GERD (gastroesophageal reflux disease)    OCC-NO MEDS   Headache    HAS RECENTLY STARTED HAVING HA'S MORE FREQUENTLY   Obesity    OSA (obstructive sleep apnea)     Current Outpatient Medications:    oxyCODONE -acetaminophen  (PERCOCET) 5-325 MG tablet, Take 1 tablet by mouth every 4 (four) hours as needed for severe pain (pain score 7-10)., Disp: 30 tablet, Rfl: 0   cyclobenzaprine  (FLEXERIL ) 5 MG tablet, Take 1 tablet (5 mg total) by mouth 3 (three) times daily as needed (neck pain)., Disp: 15 tablet, Rfl: 0   gabapentin  (NEURONTIN ) 300 MG capsule, Take 1 capsule (300 mg total) by mouth 3 (three) times daily., Disp: 90 capsule, Rfl: 3   ibuprofen  (ADVIL ) 800 MG tablet, Take 1 tablet (800 mg total) by mouth every 6 (six) hours as needed., Disp: 60 tablet, Rfl: 1   ibuprofen  (ADVIL ) 800 MG tablet, Take 1 tablet (800 mg total) by mouth every 6 (six) hours as needed., Disp: 60 tablet, Rfl: 1   methylPREDNISolone  (MEDROL  DOSEPAK) 4 MG TBPK tablet, Take as directed, Disp: 21 each, Rfl: 0   naproxen  (NAPROSYN ) 500 MG tablet, Take 1 tablet (500 mg total) by mouth 3 (three) times daily as needed for moderate pain (pain score 4-6)., Disp: 20 tablet, Rfl: 2    oxyCODONE -acetaminophen  (PERCOCET) 5-325 MG tablet, Take 1 tablet by mouth every 4 (four) hours as needed for severe pain (pain score 7-10)., Disp: 30 tablet, Rfl: 0   oxyCODONE -acetaminophen  (PERCOCET) 5-325 MG tablet, Take 1 tablet by mouth every 4 (four) hours as needed for severe pain (pain score 7-10)., Disp: 30 tablet, Rfl: 0   oxyCODONE -acetaminophen  (PERCOCET) 5-325 MG tablet, Take 1 tablet by mouth every 4 (four) hours as needed for severe pain (pain score 7-10)., Disp: 30 tablet, Rfl: 0   oxyCODONE -acetaminophen  (PERCOCET) 5-325 MG tablet, Take 1 tablet by mouth every 4 (four) hours as needed for severe pain (pain score 7-10)., Disp: 30 tablet, Rfl: 0   oxyCODONE -acetaminophen  (PERCOCET) 5-325 MG tablet, Take 1 tablet by mouth every 4 (four) hours as needed for severe pain (pain score 7-10)., Disp: 30 tablet, Rfl: 0   oxyCODONE -acetaminophen  (PERCOCET) 5-325 MG tablet, Take 1 tablet by mouth every 4 (four) hours as needed for severe pain (pain score 7-10)., Disp: 30 tablet, Rfl: 0   oxyCODONE -acetaminophen  (PERCOCET) 5-325 MG tablet, Take 1 tablet by mouth every 4 (four) hours as needed for severe pain (pain score 7-10)., Disp: 30 tablet, Rfl: 0   oxyCODONE -acetaminophen  (PERCOCET) 5-325 MG tablet, Take 1 tablet by mouth every 4 (four) hours as needed for severe  pain (pain score 7-10)., Disp: 20 tablet, Rfl: 0   oxyCODONE -acetaminophen  (PERCOCET) 5-325 MG tablet, Take 1 tablet by mouth every 4 (four) hours as needed for severe pain (pain score 7-10)., Disp: 30 tablet, Rfl: 0  Social History   Tobacco Use  Smoking Status Some Days   Current packs/day: 0.10   Average packs/day: 0.1 packs/day for 14.0 years (1.4 ttl pk-yrs)   Types: Cigarettes  Smokeless Tobacco Never    Allergies  Allergen Reactions   Haldol [Haloperidol Lactate]     Throat swelling   Amoxicillin Rash    Did it involve swelling of the face/tongue/throat, SOB, or low BP? Unknown Did it involve sudden or severe  rash/hives, skin peeling, or any reaction on the inside of your mouth or nose? Yes Did you need to seek medical attention at a hospital or doctor's office? Yes When did it last happen? Childhood reaction. If all above answers are "NO", may proceed with cephalosporin use.    Objective:  There were no vitals filed for this visit. There is no height or weight on file to calculate BMI. Constitutional Well developed. Well nourished.  Vascular Foot warm and well perfused. Capillary refill normal to all digits.   Neurologic Normal speech. Oriented to person, place, and time. Epicritic sensation to light touch grossly present bilaterally.  Dermatologic Skin completely epithelialized.  No signs of dehiscence noted no complication noted.  Orthopedic: Mild tenderness to palpation noted about the surgical site.   Radiographs: None Assessment:   No diagnosis found.   Plan:  Patient was evaluated and treated and all questions answered.  S/p foot surgery right -Clinically healed and off discharge from my care if any foot and ankle issues arise in the future she will come back and see me.  At this time she will continues to have some arthritic pain.   No follow-ups on file.

## 2023-07-23 ENCOUNTER — Telehealth: Payer: Self-pay | Admitting: Podiatry

## 2023-07-23 ENCOUNTER — Other Ambulatory Visit: Payer: Self-pay | Admitting: Podiatry

## 2023-07-23 NOTE — Telephone Encounter (Signed)
Patient needs a refill of oxycodone.

## 2023-07-24 ENCOUNTER — Telehealth: Payer: Self-pay | Admitting: Podiatry

## 2023-07-24 ENCOUNTER — Other Ambulatory Visit: Payer: Self-pay | Admitting: Podiatry

## 2023-07-24 NOTE — Telephone Encounter (Signed)
 Patient called requesting a refill of pain medication

## 2023-07-28 ENCOUNTER — Encounter: Payer: Self-pay | Admitting: Podiatry

## 2023-07-28 DIAGNOSIS — Z0271 Encounter for disability determination: Secondary | ICD-10-CM

## 2023-07-28 NOTE — Telephone Encounter (Signed)
 The pt says she is in a lot of pain and the 2 hours per day is hard on her. That is the reason she is seeking perm disability. I will do her a letter for her job and email to her and fax atty [employer] [phone]

## 2023-07-31 ENCOUNTER — Other Ambulatory Visit: Payer: Self-pay | Admitting: Podiatry

## 2023-08-04 ENCOUNTER — Other Ambulatory Visit: Payer: Self-pay | Admitting: Podiatry

## 2023-08-04 ENCOUNTER — Telehealth: Payer: Self-pay | Admitting: Podiatry

## 2023-08-04 MED ORDER — OXYCODONE-ACETAMINOPHEN 5-325 MG PO TABS: 1.0000 | ORAL_TABLET | Freq: Four times a day (QID) | ORAL | 0 refills | Status: DC | PRN

## 2023-08-04 NOTE — Progress Notes (Signed)
 PRN postop pain.   Madison Singleton, DPM [facility]  Dr. Dot Singleton, DPM    [address] [place], [place] [address]                Office [phone]  Fax [phone]

## 2023-08-04 NOTE — Telephone Encounter (Signed)
 Patient calling asking for refill of pain medication.

## 2023-08-05 ENCOUNTER — Telehealth: Payer: Self-pay | Admitting: Podiatry

## 2023-08-05 NOTE — Telephone Encounter (Signed)
 Pt called in requesting a refill for oxycodone .Preferred pharmacy is [facility] in [place] [phone]

## 2023-08-05 NOTE — Telephone Encounter (Signed)
 Pt requests a refill of oxycodone .She prefers [facility] in [phone].

## 2023-08-05 NOTE — Telephone Encounter (Signed)
 Patient had called earlier to have her prescription sent to [facility] in [place]. Patient called back stating that either Dr. Lydia Sams or Dr. Clydia Dart needed to be the prescribing provider due to insurance issues for he Oxycodone  refill

## 2023-08-07 NOTE — Telephone Encounter (Signed)
 Error

## 2023-08-11 ENCOUNTER — Encounter: Payer: Self-pay | Admitting: Podiatry

## 2023-08-12 ENCOUNTER — Other Ambulatory Visit: Payer: Self-pay | Admitting: Podiatry

## 2023-08-12 MED ORDER — OXYCODONE-ACETAMINOPHEN 5-325 MG PO TABS: 1.0000 | ORAL_TABLET | ORAL | 0 refills | Status: DC | PRN

## 2023-08-21 ENCOUNTER — Telehealth: Payer: Self-pay | Admitting: Podiatry

## 2023-08-21 MED ORDER — OXYCODONE-ACETAMINOPHEN 5-325 MG PO TABS: 1.0000 | ORAL_TABLET | ORAL | 0 refills | Status: AC | PRN

## 2023-08-21 NOTE — Addendum Note (Signed)
 Addended byMichalene Agee, Mehar Kirkwood R on: 08/21/2023 11:36 AM   Modules accepted: Orders

## 2023-08-21 NOTE — Telephone Encounter (Signed)
 Patient calling for RX refill of pain medication.

## 2023-09-05 ENCOUNTER — Telehealth: Payer: Self-pay | Admitting: Podiatry

## 2023-09-05 NOTE — Telephone Encounter (Signed)
Patient called requesting refill of pain medication 

## 2023-09-05 NOTE — Telephone Encounter (Signed)
 Patient requests a refill of oxycodone .  [facility] [address] ([address]), [place] - [address] Phone: [phone]  Fax: [phone]

## 2023-09-09 ENCOUNTER — Other Ambulatory Visit: Payer: Self-pay | Admitting: Podiatry

## 2023-09-09 MED ORDER — OXYCODONE-ACETAMINOPHEN 5-325 MG PO TABS: 1.0000 | ORAL_TABLET | ORAL | 0 refills | Status: AC | PRN

## 2023-09-16 ENCOUNTER — Telehealth: Payer: Self-pay | Admitting: Podiatry

## 2023-09-16 ENCOUNTER — Encounter: Payer: Self-pay | Admitting: Podiatry

## 2023-09-16 NOTE — Telephone Encounter (Signed)
 Patient called requesting a refill of pain medication

## 2023-09-17 ENCOUNTER — Telehealth: Payer: Self-pay | Admitting: Podiatry

## 2023-09-17 NOTE — Telephone Encounter (Signed)
 Patient requests refill for oxycodone .

## 2023-09-22 ENCOUNTER — Telehealth: Payer: Self-pay | Admitting: Podiatry

## 2023-09-22 NOTE — Telephone Encounter (Signed)
 Patient is calling for RX refill of pain meds

## 2023-09-25 ENCOUNTER — Ambulatory Visit: Admitting: Podiatry

## 2023-09-25 ENCOUNTER — Other Ambulatory Visit: Payer: Self-pay | Admitting: Podiatry

## 2023-09-25 MED ORDER — OXYCODONE-ACETAMINOPHEN 5-325 MG PO TABS: 1.0000 | ORAL_TABLET | ORAL | 0 refills | Status: AC | PRN

## 2023-10-01 ENCOUNTER — Other Ambulatory Visit: Payer: Self-pay

## 2023-10-01 ENCOUNTER — Encounter: Payer: Self-pay | Admitting: Intensive Care

## 2023-10-01 ENCOUNTER — Emergency Department
Admission: EM | Admit: 2023-10-01 | Discharge: 2023-10-01 | Disposition: A | Discharge status: Home / Self Care | Attending: Emergency Medicine | Admitting: Emergency Medicine

## 2023-10-01 DIAGNOSIS — Z3A00 Weeks of gestation of pregnancy not specified: Secondary | ICD-10-CM | POA: Diagnosis not present

## 2023-10-01 DIAGNOSIS — L723 Sebaceous cyst: Secondary | ICD-10-CM | POA: Diagnosis not present

## 2023-10-01 DIAGNOSIS — Z3201 Encounter for pregnancy test, result positive: Secondary | ICD-10-CM

## 2023-10-01 DIAGNOSIS — O99719 Diseases of the skin and subcutaneous tissue complicating pregnancy, unspecified trimester: Secondary | ICD-10-CM | POA: Diagnosis not present

## 2023-10-01 DIAGNOSIS — O26899 Other specified pregnancy related conditions, unspecified trimester: Secondary | ICD-10-CM | POA: Diagnosis present

## 2023-10-01 LAB — PREGNANCY, URINE: Preg Test, Ur: POSITIVE — AB

## 2023-10-01 MED ORDER — ACETAMINOPHEN 500 MG PO TABS
1000.0000 mg | ORAL_TABLET | Freq: Once | ORAL | Status: AC
  Administered 2023-10-01: 1000 mg via ORAL
  Filled 2023-10-01: qty 2

## 2023-10-01 MED ORDER — CLINDAMYCIN HCL 150 MG PO CAPS
450.0000 mg | ORAL_CAPSULE | Freq: Three times a day (TID) | ORAL | 0 refills | Status: AC
Start: 2023-10-01 — End: 2023-10-08

## 2023-10-01 NOTE — ED Notes (Signed)
 Pt verbalizes understanding of discharge instructions. Opportunity for questioning and answers were provided. Pt discharged from ED to home.   ? ?

## 2023-10-01 NOTE — Discharge Instructions (Addendum)
 You were seen in the emergency department for a suspected cyst of the right underarm.  Please pick up and take the antibiotics as prescribed.  Please take the antibiotics with food and a probiotic of your choice that you can get over-the-counter at any drugstore. You can also use warm compresses at home. Please take Tylenol  for pain based on the instructions on the bottle.  Please return to the emergency department for any new, concerning, or worsening symptoms.  Please follow-up with the general surgeon listed in the paperwork.

## 2023-10-01 NOTE — ED Triage Notes (Signed)
 Patient presents with axilla abscess X3-4 days. Denies drainage.

## 2023-10-01 NOTE — ED Provider Notes (Signed)
 [facility] Provider Note    Event Date/Time   First MD Initiated Contact with Patient 10/01/23 [phone]     (approximate)   History   Abscess   HPI  Madison Singleton is a 32 y.o. female  with a past medical history of generalized anxiety disorder, depression, OSA, chronic PTSD, Hx substance abuse, bipolar disorder, presents to the emergency department with right axillary nodule x 3 to 4 days.  Patient denies puslike or bloody drainage, fever, erythema, edema, numbness, tingling, weight loss, breast pain, nipple drainage.  Patient also had concerns regarding if she was pregnant. Endorses allergy to amoxicillin.     Physical Exam   Triage Vital Signs: ED Triage Vitals  Encounter Vitals Group     BP 10/01/23 0855 114/68     Girls Systolic BP Percentile --      Girls Diastolic BP Percentile --      Boys Systolic BP Percentile --      Boys Diastolic BP Percentile --      Pulse Rate 10/01/23 0855 87     Resp 10/01/23 0855 16     Temp 10/01/23 0855 98.6 F (37 C)     Temp Source 10/01/23 0855 Oral     SpO2 10/01/23 0855 100 %     Weight 10/01/23 0851 175 lb (79.4 kg)     Height 10/01/23 0851 5' 1 (1.549 m)     Head Circumference --      Peak Flow --      Pain Score 10/01/23 0851 9     Pain Loc --      Pain Education --      Exclude from Growth Chart --     Most recent vital signs: Vitals:   10/01/23 0855  BP: 114/68  Pulse: 87  Resp: 16  Temp: 98.6 F (37 C)  SpO2: 100%    General: Awake, in no acute distress. Appears stated age. Head: Normocephalic, atraumatic. Neck: Supple. CV: Regular rate, 87 bpm. Peripheral pulses 2+ and symmetric. No edema. Respiratory: No respiratory distress. Normal respiratory effort. GI: Soft, non-distended, non-tender. No rebound or guarding.  Skin:Warm, dry, intact. 4 x 2 cm deep cyst palpated in the right axilla, TTP.  No puslike drainage. No fluctuance. No right breast tenderness.  Neurological: A&Ox4 to  person, place, time, and situation.   ED Results / Procedures / Treatments   Labs (all labs ordered are listed, but only abnormal results are displayed) Labs Reviewed  PREGNANCY, URINE - Abnormal; Notable for the following components:      Result Value   Preg Test, Ur POSITIVE (*)    All other components within normal limits     EKG    RADIOLOGY    PROCEDURES:  Critical Care performed: No   Procedures   MEDICATIONS ORDERED IN ED: Medications  acetaminophen  (TYLENOL ) tablet 1,000 mg (has no administration in time range)     IMPRESSION / MDM / ASSESSMENT AND PLAN / ED COURSE  I reviewed the triage vital signs and the nursing notes.                              Differential diagnosis includes, but is not limited to, axillary cyst, axillary abscess, hidradenitis suppurativa, positive pregnancy test  Patient's presentation is most consistent with acute complicated illness / injury requiring diagnostic workup.  Patient is a 33 year old female who presented with nodule in the right  axillary region, most consistent with a deep cyst.  Patient was given 1000 mg of Tylenol  in the emergency department for pain.  She did have a positive urine pregnancy test.  We discussed following up with OB/GYN, she does have access to a OB/GYN provider already.  We discussed how the cyst was too deep to incise in the emergency department.  She was given follow-up information with general surgery.  She was also provided with a prescription for clindamycin  and listed in her paperwork to take with a probiotic of her choice.  She can also use warm compresses at home and Tylenol  for pain.  All vital signs within normal range.  Patient was given the opportunity to ask questions; all questions were answered. Emergency department return precautions were discussed with the patient.  Patient is in agreement to the treatment plan.  Patient is stable for discharge.    FINAL CLINICAL IMPRESSION(S) / ED  DIAGNOSES   Final diagnoses:  Sebaceous cyst of right axilla  Positive pregnancy test     Rx / DC Orders   ED Discharge Orders          Ordered    clindamycin  (CLEOCIN ) 150 MG capsule  3 times daily        10/01/23 1032             Note:  This document was prepared using Dragon voice recognition software and may include unintentional dictation errors.     Sheron Salm, PA-C 10/01/23 1044    Suzanne Kirsch, MD 10/01/23 1553

## 2023-10-01 NOTE — ED Notes (Signed)
Called lab for urine preg

## 2023-10-02 ENCOUNTER — Ambulatory Visit: Admitting: Surgery

## 2023-10-02 ENCOUNTER — Encounter: Payer: Self-pay | Admitting: Surgery

## 2023-10-02 ENCOUNTER — Ambulatory Visit
Admission: RE | Admit: 2023-10-02 | Discharge: 2023-10-02 | Disposition: A | Discharge status: Home / Self Care | Source: Ambulatory Visit | Attending: Surgery | Admitting: Surgery

## 2023-10-02 VITALS — BP 103/70 | HR 89 | Ht 61.0 in | Wt 170.0 lb

## 2023-10-02 DIAGNOSIS — R590 Localized enlarged lymph nodes: Secondary | ICD-10-CM | POA: Insufficient documentation

## 2023-10-02 NOTE — Progress Notes (Signed)
 Patient ID: Madison Singleton, female   DOB: 04-Dec-1990, 33 y.o.   MRN: [id number]  Chief Complaint: Tender lump right axilla  History of Present Illness Madison Singleton is a 33 y.o. female with the above beginning approximately 4 days ago, progressing to her visit in the ED yesterday.  She tested positive on her pregnancy test there.  In the ED yesterday: She was also provided with a prescription for clindamycin  and listed in her paperwork to take with a probiotic of her choice.  She can also use warm compresses at home and Tylenol  for pain.   She began taking her clindamycin , she remarked 3 of 3 times a day.  It is no worse.  She denies any kind of drainage from the right axilla, no prior history of incision and drainage of a reported similar occurrence years ago.  She denies any breast trauma, breast tenderness, nipple discharge or drainage.  No other known infections, rash or tender foci.  Past Medical History Past Medical History:  Diagnosis Date   Anxiety    NO MEDS   Arthritis of ankle    Breast mass    Breast tenderness in female    3;00 x3 weeks   Chronic pain syndrome    Depression    NO MEDS   GERD (gastroesophageal reflux disease)    OCC-NO MEDS   Headache    HAS RECENTLY STARTED HAVING HA'S MORE FREQUENTLY   Obesity    OSA (obstructive sleep apnea)       Past Surgical History:  Procedure Laterality Date   KNEE ARTHROSCOPY WITH MEDIAL MENISECTOMY Right 09/07/2015   Procedure: Arthroscopic Lateral Release ;  Surgeon: Ozell Flake, MD;  Location: [facility] ORS;  Service: Orthopedics;  Laterality: Right;   MOUTH SURGERY  12/2018   ORIF ANKLE FRACTURE Right 02/16/2019   Procedure: OPEN REDUCTION INTERNAL FIXATION (ORIF) ANKLE FRACTURE;  Surgeon: Leora Lynwood SAUNDERS, MD;  Location: [facility] ORS;  Service: Orthopedics;  Laterality: Right;    Allergies  Allergen Reactions   Haldol [Haloperidol Lactate]     Throat swelling   Amoxicillin Rash    Did it involve swelling of the  face/tongue/throat, SOB, or low BP? Unknown Did it involve sudden or severe rash/hives, skin peeling, or any reaction on the inside of your mouth or nose? Yes Did you need to seek medical attention at a hospital or doctor's office? Yes When did it last happen? Childhood reaction. If all above answers are NO, may proceed with cephalosporin use.     Current Outpatient Medications  Medication Sig Dispense Refill   clindamycin  (CLEOCIN ) 150 MG capsule Take 3 capsules (450 mg total) by mouth 3 (three) times daily for 7 days. 63 capsule 0   oxyCODONE -acetaminophen  (PERCOCET) 5-325 MG tablet Take 1 tablet by mouth every 4 (four) hours as needed for severe pain (pain score 7-10). 30 tablet 0   oxyCODONE -acetaminophen  (PERCOCET) 5-325 MG tablet Take 1 tablet by mouth every 4 (four) hours as needed for severe pain (pain score 7-10). 30 tablet 0   oxyCODONE -acetaminophen  (PERCOCET) 5-325 MG tablet Take 1 tablet by mouth every 4 (four) hours as needed for severe pain (pain score 7-10). 30 tablet 0   No current facility-administered medications for this visit.    Family History Family History  Problem Relation Age of Onset   Breast cancer Cousin       Social History Social History   Tobacco Use   Smoking status: Every Day    Current packs/day: 0.50  Types: Cigarettes    Passive exposure: Past   Smokeless tobacco: Never  Vaping Use   Vaping status: Never Used  Substance Use Topics   Alcohol use: Yes    Alcohol/week: 1.0 standard drink of alcohol    Types: 1 Cans of beer per week    Comment: sometime   Drug use: Not Currently    Types: Cocaine    Comment: 10/22/2021        Review of Systems  Constitutional: Negative.   HENT: Negative.    Eyes:  Positive for blurred vision and double vision.  Respiratory: Negative.    Cardiovascular:  Positive for leg swelling.  Gastrointestinal:  Positive for abdominal pain and heartburn.  Genitourinary:  Positive for frequency.  Skin:  Negative.   Neurological:  Positive for headaches.  Psychiatric/Behavioral:  Positive for depression. The patient is nervous/anxious.      Physical Exam Blood pressure 103/70, pulse 89, height 5' 1 (1.549 m), weight 170 lb (77.1 kg), last menstrual period 09/05/2023, SpO2 98%, unknown if currently breastfeeding. Last Weight  Most recent update: 10/02/2023  9:18 AM    Weight  77.1 kg (170 lb)             CONSTITUTIONAL: Well developed, and nourished, appropriately responsive and aware without distress.   EYES: Sclera non-icteric.   EARS, NOSE, MOUTH AND THROAT:  The oropharynx is clear. Oral mucosa is pink and moist.    Hearing is intact to voice.  NECK: Trachea is midline, and there is no jugular venous distension.  LYMPH NODES:  Lymph nodes in the neck are not appreciated. RESPIRATORY:  Normal respiratory effort without pathologic use of accessory muscles. CARDIOVASCULAR: Well perfused.  GI: The abdomen is  soft, nontender, and nondistended.  GU: Caryl Lyn present as chaperone, exam limited to right axilla.  There is some subcutaneous, suspected lymphadenopathy that appears to be the source of tenderness.  I took a photo of the overlying skin and will be included.  There is no evidence of any dermal involvement, no evidence of any dermal cyst or subcutaneous abscess present.  No induration, no fluctuance.  No cellulitis.  MUSCULOSKELETAL:  Symmetrical muscle tone appreciated in all four extremities.    SKIN: Skin turgor is normal. No pathologic skin lesions appreciated.  NEUROLOGIC:  Motor and sensation appear grossly normal.  Cranial nerves are grossly without defect. PSYCH:  Alert and oriented to person, place and time. Affect is appropriate for situation.  Data Reviewed I have personally reviewed what is currently available of the patient's imaging, recent labs and medical records.   Labs:     Latest Ref Rng & Units 10/23/2022    4:19 PM 01/31/2022    8:00 AM 10/24/2021    5:16  AM  CBC  WBC 4.0 - 10.5 K/uL 6.7  4.6  14.6   Hemoglobin 12.0 - 15.0 g/dL 88.0  87.2  9.0   Hematocrit 36.0 - 46.0 % 37.5  40.1  29.3   Platelets 150 - 400 K/uL 438  386  319       Latest Ref Rng & Units 10/23/2022    4:19 PM 01/31/2022    8:00 AM 07/26/2021   11:31 AM  CMP  Glucose 70 - 99 mg/dL 70 - 99 mg/dL 837    841  880  88   BUN 6 - 20 mg/dL 6 - 20 mg/dL 10    9  15  9    Creatinine 0.44 - 1.00 mg/dL 9.55 -  1.00 mg/dL 9.31    9.31  9.18  9.31   Sodium 135 - 145 mmol/L 135 - 145 mmol/L 133    132  137  133   Potassium 3.5 - 5.1 mmol/L 3.5 - 5.1 mmol/L 3.5    3.5  4.1  3.6   Chloride 98 - 111 mmol/L 98 - 111 mmol/L 101    102  104  104   CO2 22 - 32 mmol/L 22 - 32 mmol/L 25    24  25  22    Calcium 8.9 - 10.3 mg/dL 8.9 - 89.6 mg/dL 8.7    8.7  9.1  8.9   Total Protein 6.5 - 8.1 g/dL 7.1     Total Bilirubin 0.3 - 1.2 mg/dL 0.2     Alkaline Phos 38 - 126 U/L 37     AST 15 - 41 U/L 20     ALT 0 - 44 U/L 22        Imaging: Radiological images reviewed:   Within last 24 hrs: No results found.  Assessment    Suspect right axillary tenderness may be secondary to lymphadenitis. Patient Active Problem List   Diagnosis Date Noted   Grand multiparity with current pregnancy in second trimester 07/10/2022   Bacterial vaginosis in pregnancy 10/26/2021   Trichomoniasis 10/26/2021   Uterine contractions 10/19/2021   Positive GBS test 10/08/2021   Preterm uterine contractions in third trimester, antepartum 10/04/2021   Unstable lie of fetus 10/02/2021   Cough 07/24/2019   Closed trimalleolar fracture of right ankle 02/08/2019   Supervision of high risk pregnancy in third trimester 03/25/2018   Pelvic pressure in pregnancy, antepartum, third trimester 02/26/2018   Bipolar disorder, current episode depressed, mild or moderate severity, unspecified (HCC) 01/02/2017   Generalized anxiety disorder 07/11/2016   Post-traumatic stress disorder, chronic 11/15/2015    Contraceptive surveillance 06/15/2013   History of depression 03/12/2013   History of substance abuse (HCC) 03/12/2013   Spontaneous vaginal delivery 03/12/2013    Plan    Will obtain right axillary ultrasound, continue course of clindamycin  and warm compresses as helpful.  Will have her follow-up within a week to ensure progression/improvement.  Expressed there may be a need for her to go to the OR should further investigation become necessary if she lacks response to her antibiotics.    I personally spent a total of 45 minutes in the care of the patient today including preparing to see the patient, getting/reviewing separately obtained history, performing a medically appropriate exam/evaluation, counseling and educating, placing orders, and documenting clinical information in the EHR.   These notes generated with voice recognition software. I apologize for typographical errors.  Honor Leghorn M.D., FACS 10/02/2023, 9:43 AM

## 2023-10-02 NOTE — Patient Instructions (Addendum)
 We will get you set up for an ultrasound of your axilla.   We will have you follow up here next week to reassess the area.    You are scheduled for an ultrasound of the Right axilla at the [facility] today.  Their address is [address], [place], [place]. (On the opposite side of [place]) You will need to arrive there by 3:30 pm. You will need to enter in through the Front entrance, imaging is down the hall on your left.

## 2023-10-03 ENCOUNTER — Other Ambulatory Visit: Payer: Self-pay

## 2023-10-03 ENCOUNTER — Telehealth: Payer: Self-pay | Admitting: Podiatry

## 2023-10-03 DIAGNOSIS — R590 Localized enlarged lymph nodes: Secondary | ICD-10-CM

## 2023-10-03 NOTE — Telephone Encounter (Signed)
 Patient called requesting refill of oxyCODONE -acetaminophen  (PERCOCET) 5-325 MG tablet.

## 2023-10-03 NOTE — Telephone Encounter (Signed)
 Patient called requesting refill RX for pain medication.

## 2023-10-07 ENCOUNTER — Telehealth: Payer: Self-pay | Admitting: Podiatry

## 2023-10-07 ENCOUNTER — Ambulatory Visit: Admitting: Podiatry

## 2023-10-07 NOTE — Telephone Encounter (Signed)
 Patient called to ask for refill of pain medication. Saw communication from Dr.Patel on 10/06/23 and  called patient back and left message on machine relaying message.

## 2023-10-09 ENCOUNTER — Ambulatory Visit: Admitting: Surgery

## 2023-10-10 ENCOUNTER — Ambulatory Visit: Admission: RE | Admit: 2023-10-10 | Source: Ambulatory Visit

## 2023-10-10 NOTE — Telephone Encounter (Signed)
 Patient reports that her oxyCODONE -acetaminophen  (Percocet) 5-325 mg tablet was prescribe on 09/25/2023, approximately two weeks ago. She is requesting a refill to be sent to the pharmacy on file.

## 2023-10-13 ENCOUNTER — Other Ambulatory Visit: Payer: Self-pay | Admitting: Surgery

## 2023-10-13 ENCOUNTER — Ambulatory Visit
Admission: RE | Admit: 2023-10-13 | Discharge: 2023-10-13 | Disposition: A | Discharge status: Home / Self Care | Source: Ambulatory Visit | Attending: Surgery | Admitting: Surgery

## 2023-10-13 DIAGNOSIS — R590 Localized enlarged lymph nodes: Secondary | ICD-10-CM

## 2023-10-13 DIAGNOSIS — R928 Other abnormal and inconclusive findings on diagnostic imaging of breast: Secondary | ICD-10-CM

## 2023-10-14 ENCOUNTER — Encounter: Payer: Self-pay | Admitting: Surgery

## 2023-10-14 ENCOUNTER — Ambulatory Visit: Admitting: Surgery

## 2023-10-14 ENCOUNTER — Telehealth: Payer: Self-pay | Admitting: Podiatry

## 2023-10-14 VITALS — BP 94/57 | HR 67 | Temp 98.1°F | Ht 61.0 in | Wt 172.0 lb

## 2023-10-14 DIAGNOSIS — R590 Localized enlarged lymph nodes: Secondary | ICD-10-CM | POA: Insufficient documentation

## 2023-10-14 DIAGNOSIS — R928 Other abnormal and inconclusive findings on diagnostic imaging of breast: Secondary | ICD-10-CM | POA: Diagnosis not present

## 2023-10-14 NOTE — Progress Notes (Signed)
 I met with this patient, and her husband today to discuss the upcoming plan for percutaneous biopsies of lesions in the right breast that are highly suspicious for breast cancer, and an axillary lymph nodes suspicious for the same. I attempted to communicate the anticipated role of percutaneous biopsy, the evaluation of prognostic indicators, and the potential for initial chemotherapy if indeed cancer is confirmed.  She is already quite alarmed/concerned about advanced cancer, as her husband is as well.  She is also concerned about how to manage her pregnancy in light of this.  I share with her the complexity of the decisions ahead of her, and will anticipate following up her pathology results.  We spent approximately 30 minutes together, most of the time spent encouraging them and helping them understand the process.  I reviewed her current radiographic images of her right breast mammography and right axillary ultrasounds, and I concur with the plan to proceed with biopsy by radiology.  I personally spent a total of 30 minutes in the care of the patient today including preparing to see the patient, counseling and educating, documenting clinical information in the EHR, and independently interpreting results.

## 2023-10-14 NOTE — Telephone Encounter (Signed)
 Patient would like a refill of pain medication.

## 2023-10-15 ENCOUNTER — Telehealth: Payer: Self-pay | Admitting: Podiatry

## 2023-10-15 ENCOUNTER — Other Ambulatory Visit: Payer: Self-pay | Admitting: Podiatry

## 2023-10-15 MED ORDER — OXYCODONE-ACETAMINOPHEN 5-325 MG PO TABS: 1.0000 | ORAL_TABLET | ORAL | 0 refills | Status: AC | PRN

## 2023-10-15 MED ORDER — IBUPROFEN 800 MG PO TABS: 800.0000 mg | ORAL_TABLET | Freq: Four times a day (QID) | ORAL | 1 refills | Status: AC | PRN

## 2023-10-15 NOTE — Telephone Encounter (Signed)
Patient following up on pain medication refill.

## 2023-10-16 ENCOUNTER — Ambulatory Visit: Admitting: Podiatry

## 2023-10-16 ENCOUNTER — Ambulatory Visit
Admission: RE | Admit: 2023-10-16 | Discharge: 2023-10-16 | Disposition: A | Discharge status: Home / Self Care | Source: Ambulatory Visit | Attending: Surgery | Admitting: Surgery

## 2023-10-16 DIAGNOSIS — R928 Other abnormal and inconclusive findings on diagnostic imaging of breast: Secondary | ICD-10-CM | POA: Diagnosis present

## 2023-10-16 DIAGNOSIS — M216X1 Other acquired deformities of right foot: Secondary | ICD-10-CM | POA: Diagnosis not present

## 2023-10-16 DIAGNOSIS — C50411 Malignant neoplasm of upper-outer quadrant of right female breast: Secondary | ICD-10-CM | POA: Diagnosis not present

## 2023-10-16 DIAGNOSIS — M19071 Primary osteoarthritis, right ankle and foot: Secondary | ICD-10-CM

## 2023-10-16 DIAGNOSIS — C773 Secondary and unspecified malignant neoplasm of axilla and upper limb lymph nodes: Secondary | ICD-10-CM | POA: Diagnosis not present

## 2023-10-16 HISTORY — PX: BREAST BIOPSY: SHX20

## 2023-10-16 MED ORDER — LIDOCAINE-EPINEPHRINE 1 %-1:100000 IJ SOLN
5.0000 mL | Freq: Once | INTRAMUSCULAR | Status: DC
  Filled 2023-10-16: qty 5

## 2023-10-16 MED ORDER — LIDOCAINE 1 % OPTIME INJ - NO CHARGE
2.0000 mL | Freq: Once | INTRAMUSCULAR | Status: DC
  Filled 2023-10-16: qty 2

## 2023-10-16 MED ORDER — LIDOCAINE 1 % OPTIME INJ - NO CHARGE
5.0000 mL | Freq: Once | INTRAMUSCULAR | Status: AC
  Administered 2023-10-16: 5 mL
  Filled 2023-10-16: qty 6

## 2023-10-16 MED ORDER — LIDOCAINE HCL 1 % IJ SOLN
5.0000 mL | Freq: Once | INTRAMUSCULAR | Status: AC
  Administered 2023-10-16: 5 mL

## 2023-10-16 MED ORDER — LIDOCAINE HCL 1 % IJ SOLN
2.0000 mL | Freq: Once | INTRAMUSCULAR | Status: DC
  Filled 2023-10-16: qty 2

## 2023-10-16 NOTE — Progress Notes (Signed)
 Subjective:  Patient ID: Madison Singleton, female    DOB: Aug 12, 1990,  MRN: [id number]  Chief Complaint  Patient presents with   Arthritis    33 y.o. female presents with the above complaint.  Patient presents with right ankle pain that has been going for a long time.  Patient states that she has been dealing with this arthritis for many years.  She wanted to discuss steroid injection.  She denies any other acute complaints when she is on her feet she feels discomfort in her ankle.  Pain scale is 5 out of 10 dull achy in nature hurts with ambulation worse with pressure   Review of Systems: Negative except as noted in the HPI. Denies N/V/F/Ch.  Past Medical History:  Diagnosis Date   Anxiety    NO MEDS   Arthritis of ankle    Breast mass    Breast tenderness in female    3;00 x3 weeks   Chronic pain syndrome    Depression    NO MEDS   GERD (gastroesophageal reflux disease)    OCC-NO MEDS   Headache    HAS RECENTLY STARTED HAVING HA'S MORE FREQUENTLY   Obesity    OSA (obstructive sleep apnea)     Current Outpatient Medications:    esomeprazole (NEXIUM) 40 MG capsule, Take 40 mg by mouth daily., Disp: , Rfl:    ibuprofen  (ADVIL ) 800 MG tablet, Take 1 tablet (800 mg total) by mouth every 6 (six) hours as needed., Disp: 60 tablet, Rfl: 1   oxyCODONE -acetaminophen  (PERCOCET) 5-325 MG tablet, Take 1 tablet by mouth every 4 (four) hours as needed for severe pain (pain score 7-10)., Disp: 30 tablet, Rfl: 0   oxyCODONE -acetaminophen  (PERCOCET) 5-325 MG tablet, Take 1 tablet by mouth every 4 (four) hours as needed for severe pain (pain score 7-10)., Disp: 30 tablet, Rfl: 0   oxyCODONE -acetaminophen  (PERCOCET) 5-325 MG tablet, Take 1 tablet by mouth every 4 (four) hours as needed for severe pain (pain score 7-10)., Disp: 30 tablet, Rfl: 0   oxyCODONE -acetaminophen  (PERCOCET) 5-325 MG tablet, Take 1 tablet by mouth every 4 (four) hours as needed for severe pain (pain score 7-10)., Disp: 30  tablet, Rfl: 0  Social History   Tobacco Use  Smoking Status Every Day   Current packs/day: 0.50   Types: Cigarettes   Passive exposure: Past  Smokeless Tobacco Never    Allergies  Allergen Reactions   Haldol [Haloperidol Lactate]     Throat swelling   Amoxicillin Rash    Did it involve swelling of the face/tongue/throat, SOB, or low BP? Unknown Did it involve sudden or severe rash/hives, skin peeling, or any reaction on the inside of your mouth or nose? Yes Did you need to seek medical attention at a hospital or doctor's office? Yes When did it last happen? Childhood reaction. If all above answers are NO, may proceed with cephalosporin use.    Objective:  There were no vitals filed for this visit. There is no height or weight on file to calculate BMI. Constitutional Well developed. Well nourished.  Vascular Dorsalis pedis pulses palpable bilaterally. Posterior tibial pulses palpable bilaterally. Capillary refill normal to all digits.  No cyanosis or clubbing noted. Pedal hair growth normal.  Neurologic Normal speech. Oriented to person, place, and time. Epicritic sensation to light touch grossly present bilaterally.  Dermatologic Nails well groomed and normal in appearance. No open wounds. No skin lesions.  Orthopedic: Pain on palpation to the right ankle pain with range of  motion of the ankle joint deep intra-articular ankle pain noted.   Radiographs: None Assessment:   1. Arthritis of right ankle    Plan:  Patient was evaluated and treated and all questions answered.  Right ankle capsulitis - All questions and concerns were discussed with the patient in extensive detail given the amount of pain that she is having should benefit from ankle injection to give her some temporary relief.  At this time she has severe ankle arthritis.  She has had multiple ankle arthroscopy done.  Patient agrees with plan to proceed with steroid injection -A steroid injection was  performed at right ankle joint using 1% plain Lidocaine  and 10 mg of Kenalog. This was well tolerated.   No follow-ups on file.

## 2023-10-17 ENCOUNTER — Telehealth: Payer: Self-pay

## 2023-10-17 DIAGNOSIS — C50911 Malignant neoplasm of unspecified site of right female breast: Secondary | ICD-10-CM

## 2023-10-17 LAB — SURGICAL PATHOLOGY

## 2023-10-17 NOTE — Telephone Encounter (Signed)
 Called and spoke with Madison Singleton regarding her new diagnosis of right breast cancer with lymph node involvement. ER/PR/Her2 pending. Arranged an appointment 10/22/23 at 1400 with Dr. Rennie. Provided directions to the cancer center. She reports 10 pregnancies. 8 live births, 1 abortion, and she is currently pregnant with a positive urine test in the ED. She has not seen her OB yet and she was encouraged to reach out and make an appointment. She has questions regarding treatment and pregnancy. She will need to discuss this with her providers at her consult.

## 2023-10-17 NOTE — Telephone Encounter (Signed)
 Refilled 10/15/2023 by provider.

## 2023-10-20 ENCOUNTER — Telehealth: Payer: Self-pay | Admitting: Surgery

## 2023-10-20 NOTE — Telephone Encounter (Signed)
 Pt is needing a nurse to call her back about the results she got and wants to know if he needs her to come back sooner. Please call at [phone]

## 2023-10-20 NOTE — Telephone Encounter (Signed)
 Pt has called back a second time wanting to know why she has not been called back from her first message at 9:36. I explained that the staff has had a very full schedule but someone should be calling her back as soon as they can. Pt # is [phone]

## 2023-10-22 ENCOUNTER — Inpatient Hospital Stay: Attending: Internal Medicine

## 2023-10-22 ENCOUNTER — Encounter: Payer: Self-pay | Admitting: *Deleted

## 2023-10-22 ENCOUNTER — Ambulatory Visit (HOSPITAL_BASED_OUTPATIENT_CLINIC_OR_DEPARTMENT_OTHER): Admitting: Internal Medicine

## 2023-10-22 VITALS — BP 90/55 | HR 78 | Temp 97.5°F | Resp 16 | Ht 62.0 in | Wt 176.6 lb

## 2023-10-22 DIAGNOSIS — C50811 Malignant neoplasm of overlapping sites of right female breast: Secondary | ICD-10-CM | POA: Insufficient documentation

## 2023-10-22 DIAGNOSIS — Z803 Family history of malignant neoplasm of breast: Secondary | ICD-10-CM | POA: Diagnosis not present

## 2023-10-22 DIAGNOSIS — Z170 Estrogen receptor positive status [ER+]: Secondary | ICD-10-CM | POA: Diagnosis not present

## 2023-10-22 DIAGNOSIS — C50411 Malignant neoplasm of upper-outer quadrant of right female breast: Secondary | ICD-10-CM

## 2023-10-22 MED ORDER — ONDANSETRON HCL 8 MG PO TABS: ORAL_TABLET | ORAL | 1 refills | Status: AC

## 2023-10-22 NOTE — Assessment & Plan Note (Addendum)
#   JULY 2025-[right arm discomfort palpable] ultrasound/mammogram diagnostic -multifocal-right breast cancer  [spanning approximately 3.6 x 4.5 x 4.4 cm]. Markedly enlarged RIGHT axillary lymph nodes are seen, the largest with a cortical thickness of 13 mm and measuring up to 4.6 cm in maximum diameter.   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  # Clinically stage II vs III cancer- I reviewed the pathology and stage of cancers in detail with the patient.  However recommend a CT scan for further evaluation-given the bulky axillary adenopathy.  I had a long discussion with the patient in general regarding the treatment options of breast cancer including-surgery; adjuvant radiation; role of adjuvant systemic therapy including-chemotherapy antihormone therapy.    #Given positive clinical lymph nodes-I would recommend neoadjuvant chemotherapy-followed by surgery.  However the first trimester pregnancy complicates the treatment planning.   # Chronic this patient specific situation-I would recommend surgery first followed by adjuvant chemotherapy.  I have reached out to surgery Dr. Lane regarding his thoughts.  Given the complicated medical/and social nature-recommend second opinion-[facility] or [facility].    # First trimester pregnancy/8 weeks as per patient.-However no obstetric care as per patient. During interview patient wanted to keep the pregnancy news discrete from her husband.  Patient did not want her husband to aware of the pregnancy.  Obviously-this has a significantly patient the whole treatment plan. Recommend urgent ob evaluation.   # Genetic counseling: As per patient multiple family members under father side with cancers.  And also mother with breast cancer currently going to therapy.  She will need genetic testing/referral.  # History of bipolar disorder-not  any treatment; History of drug abuse-obviously complicates care at this time.  Thank you Dr.MIles  for allowing me to participate in the care of your pleasant patient. Please do not hesitate to contact me with questions or concerns in the interim.  Discussed with Therisa Ada breast navigator.  # DISPOSITION: # no labs-  # [facility] breast cancer clinic- second opinion breast cancer  # follow up TBD- Dr.B  Dr.Miles-

## 2023-10-22 NOTE — Progress Notes (Signed)
 [facility] CONSULT NOTE  Patient Care Team: [place] HERO, MD as PCP - General (Family Medicine) Rennie Cindy SAUNDERS, MD as Consulting Physician (Oncology) Georgina Shasta POUR, RN as Oncology Nurse Navigator  CHIEF COMPLAINTS/PURPOSE OF CONSULTATION: Breast cancer  #  Oncology History Overview Note   LATERAL RIGHT breast approximate 9 o'clock position there is a focal asymmetry versus multiple adjacent focal asymmetries spanning approximately 3.6 x 4.5 x 4.4 cm (M-L x A-P x S-I). There is vague associated distortion, particularly anteriorly. 4 mm oval circumscribed mass is also noted on the CC spot view (slice 25/60). An enlarged RIGHT axillary lymph node is partially visualized on the MLO view only.   Targeted ultrasound of the RIGHT breast demonstrates multiple masses in the 9-930 region, including:   1. Irregular hypoechoic mass with circumscribed margins at 9 o'clock 6 cm from the nipple measuring 9 x 6 x 10 mm.   2. Immediately deep to mass 1, there is an oval, hypoechoic mass with indistinct margins measuring 5 x 2 x 4 mm, also at 9 o'clock 6 cm from the nipple.   3. At 9 o'clock, 9 cm from the nipple there is an oval, hypoechoic mass with a single angular margin measuring 4 x 3 x 5 mm.   4. Irregular, spiculated hypoechoic mass at the 9:30 position 3 cm from the nipple measuring 9 x 5 x 9 mm.   5. Superficial mass at 9 o'clock 5 cm from the nipple measuring 4 x 3 x 2 mm, likely correlating with the circumscribed mass seen on the CC view.   Additionally, at least 3 markedly enlarged RIGHT axillary lymph nodes are seen, the largest with a cortical thickness of 13 mm and measuring up to 4.6 cm in maximum diameter. Ultrasound of the LEFT axilla was performed for comparison sake, with only normal lymph nodes identified.   Malignant neoplasm of upper-outer quadrant of right breast in female, estrogen receptor positive (HCC)  10/22/2023 Initial Diagnosis    Malignant neoplasm of upper-outer quadrant of right breast in female, estrogen receptor positive (HCC)      HISTORY OF PRESENTING ILLNESS: Patient ambulating-independently. /Accompanied by husband.  Disa Kelly 33 y.o.  female currently [redacted] weeks pregnant [as per patient]; history of drug abuse; history of bipolar disorder with no prior history of breast cancer/or malignancies has been referred to us  for further evaluation recommendations for new diagnosis of breast cancer.  Patient noted to have soreness in the right underarm 2 to 3 weeks prior to presentation.   Patient was evaluated in the emergency room for possible axillary abscess status post antibiotics.  However further evaluation with surgery.  Subsequently underwent ultrasound mammogram abnormal-positive for malignancy.   Patient did not have any OB evaluation yet.   Review of Systems  Constitutional:  Positive for malaise/fatigue. Negative for chills, diaphoresis, fever and weight loss.  HENT:  Negative for nosebleeds and sore throat.   Eyes:  Negative for double vision.  Respiratory:  Negative for cough, hemoptysis, sputum production, shortness of breath and wheezing.   Cardiovascular:  Negative for chest pain, palpitations, orthopnea and leg swelling.  Gastrointestinal:  Positive for abdominal pain, constipation and nausea. Negative for blood in stool, diarrhea, heartburn, melena and vomiting.  Genitourinary:  Negative for dysuria, frequency and urgency.  Musculoskeletal:  Negative for back pain and joint pain.  Skin: Negative.  Negative for itching and rash.  Neurological:  Positive for headaches. Negative for dizziness, tingling, focal weakness and weakness.  Endo/Heme/Allergies:  Does not bruise/bleed easily.  Psychiatric/Behavioral:  Negative for depression. The patient is not nervous/anxious and does not have insomnia.      MEDICAL HISTORY:  Past Medical History:  Diagnosis Date   Anxiety    NO MEDS   Arthritis  of ankle    Breast mass    Breast tenderness in female    3;00 x3 weeks   Chronic pain syndrome    Depression    NO MEDS   GERD (gastroesophageal reflux disease)    OCC-NO MEDS   Headache    HAS RECENTLY STARTED HAVING HA'S MORE FREQUENTLY   Obesity    OSA (obstructive sleep apnea)     SURGICAL HISTORY: Past Surgical History:  Procedure Laterality Date   BREAST BIOPSY Right 10/16/2023   US  RT BREAST BX W LOC DEV EA ADD LESION IMG BX SPEC US  GUIDE 10/16/2023 [facility]   BREAST BIOPSY Right 10/16/2023   US  RT BREAST BX W LOC DEV 1ST LESION IMG BX SPEC US  GUIDE 10/16/2023 [facility]   KNEE ARTHROSCOPY WITH MEDIAL MENISECTOMY Right 09/07/2015   Procedure: Arthroscopic Lateral Release ;  Surgeon: Ozell Flake, MD;  Location: [facility];  Service: Orthopedics;  Laterality: Right;   MOUTH SURGERY  12/2018   ORIF ANKLE FRACTURE Right 02/16/2019   Procedure: OPEN REDUCTION INTERNAL FIXATION (ORIF) ANKLE FRACTURE;  Surgeon: Leora Lynwood SAUNDERS, MD;  Location: [facility];  Service: Orthopedics;  Laterality: Right;    SOCIAL HISTORY: Social History   Socioeconomic History   Marital status: Married    Spouse name: Not on file   Number of children: Not on file   Years of education: Not on file   Highest education level: Not on file  Occupational History   Not on file  Tobacco Use   Smoking status: Every Day    Current packs/day: 0.50    Types: Cigarettes    Passive exposure: Past   Smokeless tobacco: Never  Vaping Use   Vaping status: Never Used  Substance and Sexual Activity   Alcohol use: Yes    Alcohol/week: 1.0 standard drink of alcohol    Types: 1 Cans of beer per week    Comment: sometime   Drug use: Not Currently    Types: Cocaine    Comment: 10/22/2021   Sexual activity: Yes    Birth control/protection: None  Other Topics Concern   Not on file  Social History Narrative   Not on file   Social Drivers of Health   Financial Resource Strain: Medium Risk  (05/20/2023)   Received from [facility]   Overall Financial Resource Strain (CARDIA)    Difficulty of Paying Living Expenses: Somewhat hard  Food Insecurity: No Food Insecurity (10/22/2023)   Hunger Vital Sign    Worried About Running Out of Food in the Last Year: Never true    Ran Out of Food in the Last Year: Never true  Transportation Needs: No Transportation Needs (10/22/2023)   PRAPARE - Administrator, Civil Service (Medical): No    Lack of Transportation (Non-Medical): No  Physical Activity: Not on file  Stress: Not on file  Social Connections: Not on file  Intimate Partner Violence: Not At Risk (10/22/2023)   Humiliation, Afraid, Rape, and Kick questionnaire    Fear of Current or Ex-Partner: No    Emotionally Abused: No    Physically Abused: No    Sexually Abused: No    FAMILY HISTORY: Family History  Problem  Relation Age of Onset   Breast cancer Cousin     ALLERGIES:  is allergic to haldol [haloperidol lactate] and amoxicillin.  MEDICATIONS:  Current Outpatient Medications  Medication Sig Dispense Refill   esomeprazole (NEXIUM) 40 MG capsule Take 40 mg by mouth daily.     ondansetron  (ZOFRAN ) 8 MG tablet One pill every 8 hours as needed for nausea/vomitting. 40 tablet 1   oxyCODONE -acetaminophen  (PERCOCET) 5-325 MG tablet Take 1 tablet by mouth every 4 (four) hours as needed for severe pain (pain score 7-10). 30 tablet 0   ibuprofen  (ADVIL ) 800 MG tablet Take 1 tablet (800 mg total) by mouth every 6 (six) hours as needed. 60 tablet 1   oxyCODONE -acetaminophen  (PERCOCET) 5-325 MG tablet Take 1 tablet by mouth every 4 (four) hours as needed for severe pain (pain score 7-10). 30 tablet 0   oxyCODONE -acetaminophen  (PERCOCET) 5-325 MG tablet Take 1 tablet by mouth every 4 (four) hours as needed for severe pain (pain score 7-10). 30 tablet 0   oxyCODONE -acetaminophen  (PERCOCET) 5-325 MG tablet Take 1 tablet by mouth every 4 (four) hours as needed  for severe pain (pain score 7-10). 30 tablet 0   No current facility-administered medications for this visit.    PHYSICAL EXAMINATION:   Vitals:   10/22/23 1420  BP: (!) 90/55  Pulse: 78  Resp: 16  Temp: (!) 97.5 F (36.4 C)  SpO2: 100%   Filed Weights   10/22/23 1420  Weight: 176 lb 9.6 oz (80.1 kg)   Right axillary adenopathy matted felt.  No nipple or skin changes noted concerning for any disease involvement.   Physical Exam Vitals and nursing note reviewed.  HENT:     Head: Normocephalic and atraumatic.     Mouth/Throat:     Pharynx: Oropharynx is clear.  Eyes:     Extraocular Movements: Extraocular movements intact.     Pupils: Pupils are equal, round, and reactive to light.  Cardiovascular:     Rate and Rhythm: Normal rate and regular rhythm.  Pulmonary:     Comments: Decreased breath sounds bilaterally.  Abdominal:     Palpations: Abdomen is soft.  Musculoskeletal:        General: Normal range of motion.     Cervical back: Normal range of motion.  Skin:    General: Skin is warm.  Neurological:     General: No focal deficit present.     Mental Status: She is alert and oriented to person, place, and time.  Psychiatric:        Behavior: Behavior normal.        Judgment: Judgment normal.      LABORATORY DATA:  I have reviewed the data as listed Lab Results  Component Value Date   WBC 6.7 10/23/2022   HGB 11.9 (L) 10/23/2022   HCT 37.5 10/23/2022   MCV 81.9 10/23/2022   PLT 438 (H) 10/23/2022   Recent Labs    10/23/22 1619  NA 132*  133*  K 3.5  3.5  CL 102  101  CO2 24  25  GLUCOSE 158*  162*  BUN 9  10  CREATININE 0.68  0.68  CALCIUM 8.7*  8.7*  GFRNONAA >60  >60  PROT 7.1  ALBUMIN 3.7  AST 20  ALT 22  ALKPHOS 37*  BILITOT 0.2*    RADIOGRAPHIC STUDIES: I have personally reviewed the radiological images as listed and agreed with the findings in the report. US  AXILLARY NODE CORE BIOPSY RIGHT Addendum Date:  10/17/2023 ADDENDUM REPORT: 10/17/2023 16:24 ADDENDUM: Pathology revealed GRADE III INVASIVE DUCTAL CARCINOMA, LYMPHOVASCULAR INVASION: SUSPICIOUS, CALCIFICATIONS: PRESENT of the RIGHT breast, 9:30 o'clock, 3cmfn, (ribbon clip). This was found to be concordant by Dr. Dirk Arrant. Pathology revealed GRADE III INVASIVE DUCTAL CARCINOMA, LYMPHOVASCULAR INVASION: SUSPICIOUS, CALCIFICATIONS: PRESENT of the RIGHT breast, 9:00 o'clock, 6cmfn, (coil clip). This was found to be concordant by Dr. Dirk Arrant. 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 This was found to be concordant by Dr. Dirk Arrant. Pathology results were discussed with the patient by telephone. The patient reported doing well after the biopsies with moderate tenderness at the sites. Post biopsy instructions and care were reviewed and questions were answered. The patient was encouraged to call [facility] for any additional concerns. My direct phone number was provided. Surgical consultation was previously arranged with Dr. Honor Leghorn at [facility] [place] on October 30, 2023. Biopsy results were sent to Dr. Honor Leghorn and Shasta Ada, RN, Oncology Navigator at [facility] via secure EPIC message on October 17, 2023. NOTE: The remaining RIGHT breast masses remain suspicious for malignancy. Recommend further management of these masses be based on surgical planning. Consideration for a bilateral breast MRI or contrast mammography for further evaluation of extent of disease given the high grade histology and age. Pathology results reported by Hendricks Benders, RN on 10/17/2023. Electronically Signed   By: Dirk Arrant M.D.   On: 10/17/2023 16:24   Result Date: 10/17/2023 CLINICAL DATA:  33 year old female presenting for ultrasound-guided biopsy of 2 representative suspicious masses in the RIGHT breast and an enlarged  RIGHT axillary lymph node. EXAM: US  AXILLARY NODE CORE BIOPSY RIGHT; ULTRASOUND RIGHT BREAST CORE BIOPSY COMPARISON:  Previous exam(s). PROCEDURE: I met with the patient and we discussed the procedure of ultrasound-guided biopsy, including benefits and alternatives. We discussed the high likelihood of a successful procedure. We discussed the risks of the procedure, including infection, bleeding, tissue injury, clip migration, and inadequate sampling. Informed written consent was given. The usual time-out protocol was performed immediately prior to the procedure. SITE 1: RIGHT breast mass 9:30 o'clock 3 cm from nipple (RIBBON clip) Lesion quadrant: Upper outer quadrant Using sterile technique and 1% Lidocaine  as local anesthetic, under direct ultrasound visualization, a 14 gauge spring-loaded device was used to perform biopsy of a mass in the right breast 9:30 o'clock position 3 cm from nipple using a lateral approach. At the conclusion of the procedure, a ribbon shaped tissue marker clip was deployed into the biopsy cavity. SITE 2: Right breast mass at 9 o'clock 6 cm from nipple (COIL clip) Lesion quadrant: Upper outer quadrant Using sterile technique and 1% Lidocaine  as local anesthetic, under direct ultrasound visualization, a 14 gauge spring-loaded device was used to perform biopsy of a mass in the right breast 9 o'clock position 6 cm from the nipple using a lateral approach. At the conclusion of the procedure, a coil shaped tissue marker clip was deployed into the biopsy cavity. SITE 3: Right axillary lymph node (HydroMARK coil clip) Lesion quadrant: Upper outer quadrant Using sterile technique and 1% Lidocaine  as local anesthetic, under direct ultrasound visualization, a 14 gauge spring-loaded device was used to perform biopsy of an enlarged right axillary lymph node using an inferior approach. At the conclusion of the procedure, HydroMARK coil tissue marker clip was deployed into the biopsy cavity. Follow up 2  view mammogram was performed and dictated separately. IMPRESSION: 1.  Ultrasound guided biopsy of a mass in the RIGHT breast 9:30 o'clock position 3 cm from nipple (RIBBON clip). No apparent complications. 2. Ultrasound guided biopsy of a mass in the RIGHT breast 9:00 o'clock position 6 cm from nipple (COIL clip). No apparent complications. 3. Ultrasound guided biopsy of an enlarged RIGHT axillary lymph node (hydroMARK clip). No apparent complications. Electronically Signed: By: Dirk Arrant M.D. On: 10/16/2023 09:43   US  RT BREAST BX W LOC DEV 1ST LESION IMG BX SPEC US  GUIDE Addendum Date: 10/17/2023 ADDENDUM REPORT: 10/17/2023 16:24 ADDENDUM: Pathology revealed GRADE III INVASIVE DUCTAL CARCINOMA, LYMPHOVASCULAR INVASION: SUSPICIOUS, CALCIFICATIONS: PRESENT of the RIGHT breast, 9:30 o'clock, 3cmfn, (ribbon clip). This was found to be concordant by Dr. Dirk Arrant. Pathology revealed GRADE III INVASIVE DUCTAL CARCINOMA, LYMPHOVASCULAR INVASION: SUSPICIOUS, CALCIFICATIONS: PRESENT of the RIGHT breast, 9:00 o'clock, 6cmfn, (coil clip). This was found to be concordant by Dr. Dirk Arrant. 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 This was found to be concordant by Dr. Dirk Arrant. Pathology results were discussed with the patient by telephone. The patient reported doing well after the biopsies with moderate tenderness at the sites. Post biopsy instructions and care were reviewed and questions were answered. The patient was encouraged to call [facility] for any additional concerns. My direct phone number was provided. Surgical consultation was previously arranged with Dr. Honor Leghorn at [facility] [place] on October 30, 2023. Biopsy results were sent to Dr. Honor Leghorn and Shasta Ada, RN, Oncology Navigator at [facility] via secure EPIC message on October 17, 2023. NOTE: The  remaining RIGHT breast masses remain suspicious for malignancy. Recommend further management of these masses be based on surgical planning. Consideration for a bilateral breast MRI or contrast mammography for further evaluation of extent of disease given the high grade histology and age. Pathology results reported by Hendricks Benders, RN on 10/17/2023. Electronically Signed   By: Dirk Arrant M.D.   On: 10/17/2023 16:24   Result Date: 10/17/2023 CLINICAL DATA:  33 year old female presenting for ultrasound-guided biopsy of 2 representative suspicious masses in the RIGHT breast and an enlarged RIGHT axillary lymph node. EXAM: US  AXILLARY NODE CORE BIOPSY RIGHT; ULTRASOUND RIGHT BREAST CORE BIOPSY COMPARISON:  Previous exam(s). PROCEDURE: I met with the patient and we discussed the procedure of ultrasound-guided biopsy, including benefits and alternatives. We discussed the high likelihood of a successful procedure. We discussed the risks of the procedure, including infection, bleeding, tissue injury, clip migration, and inadequate sampling. Informed written consent was given. The usual time-out protocol was performed immediately prior to the procedure. SITE 1: RIGHT breast mass 9:30 o'clock 3 cm from nipple (RIBBON clip) Lesion quadrant: Upper outer quadrant Using sterile technique and 1% Lidocaine  as local anesthetic, under direct ultrasound visualization, a 14 gauge spring-loaded device was used to perform biopsy of a mass in the right breast 9:30 o'clock position 3 cm from nipple using a lateral approach. At the conclusion of the procedure, a ribbon shaped tissue marker clip was deployed into the biopsy cavity. SITE 2: Right breast mass at 9 o'clock 6 cm from nipple (COIL clip) Lesion quadrant: Upper outer quadrant Using sterile technique and 1% Lidocaine  as local anesthetic, under direct ultrasound visualization, a 14 gauge spring-loaded device was used to perform biopsy of a mass in the right breast 9 o'clock  position 6 cm from the nipple using a lateral approach. At the conclusion of the procedure,  a coil shaped tissue marker clip was deployed into the biopsy cavity. SITE 3: Right axillary lymph node (HydroMARK coil clip) Lesion quadrant: Upper outer quadrant Using sterile technique and 1% Lidocaine  as local anesthetic, under direct ultrasound visualization, a 14 gauge spring-loaded device was used to perform biopsy of an enlarged right axillary lymph node using an inferior approach. At the conclusion of the procedure, HydroMARK coil tissue marker clip was deployed into the biopsy cavity. Follow up 2 view mammogram was performed and dictated separately. IMPRESSION: 1. Ultrasound guided biopsy of a mass in the RIGHT breast 9:30 o'clock position 3 cm from nipple (RIBBON clip). No apparent complications. 2. Ultrasound guided biopsy of a mass in the RIGHT breast 9:00 o'clock position 6 cm from nipple (COIL clip). No apparent complications. 3. Ultrasound guided biopsy of an enlarged RIGHT axillary lymph node (hydroMARK clip). No apparent complications. Electronically Signed: By: Dirk Arrant M.D. On: 10/16/2023 09:43   US  RT BREAST BX W LOC DEV EA ADD LESION IMG BX SPEC US  GUIDE Addendum Date: 10/17/2023 ADDENDUM REPORT: 10/17/2023 16:24 ADDENDUM: Pathology revealed GRADE III INVASIVE DUCTAL CARCINOMA, LYMPHOVASCULAR INVASION: SUSPICIOUS, CALCIFICATIONS: PRESENT of the RIGHT breast, 9:30 o'clock, 3cmfn, (ribbon clip). This was found to be concordant by Dr. Dirk Arrant. Pathology revealed GRADE III INVASIVE DUCTAL CARCINOMA, LYMPHOVASCULAR INVASION: SUSPICIOUS, CALCIFICATIONS: PRESENT of the RIGHT breast, 9:00 o'clock, 6cmfn, (coil clip). This was found to be concordant by Dr. Dirk Arrant. 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 This was found to be concordant by Dr. Dirk Arrant. Pathology results were discussed with the patient by  telephone. The patient reported doing well after the biopsies with moderate tenderness at the sites. Post biopsy instructions and care were reviewed and questions were answered. The patient was encouraged to call [facility] for any additional concerns. My direct phone number was provided. Surgical consultation was previously arranged with Dr. Honor Leghorn at [facility] [place] on October 30, 2023. Biopsy results were sent to Dr. Honor Leghorn and Shasta Ada, RN, Oncology Navigator at [facility] via secure EPIC message on October 17, 2023. NOTE: The remaining RIGHT breast masses remain suspicious for malignancy. Recommend further management of these masses be based on surgical planning. Consideration for a bilateral breast MRI or contrast mammography for further evaluation of extent of disease given the high grade histology and age. Pathology results reported by Hendricks Benders, RN on 10/17/2023. Electronically Signed   By: Dirk Arrant M.D.   On: 10/17/2023 16:24   Result Date: 10/17/2023 CLINICAL DATA:  33 year old female presenting for ultrasound-guided biopsy of 2 representative suspicious masses in the RIGHT breast and an enlarged RIGHT axillary lymph node. EXAM: US  AXILLARY NODE CORE BIOPSY RIGHT; ULTRASOUND RIGHT BREAST CORE BIOPSY COMPARISON:  Previous exam(s). PROCEDURE: I met with the patient and we discussed the procedure of ultrasound-guided biopsy, including benefits and alternatives. We discussed the high likelihood of a successful procedure. We discussed the risks of the procedure, including infection, bleeding, tissue injury, clip migration, and inadequate sampling. Informed written consent was given. The usual time-out protocol was performed immediately prior to the procedure. SITE 1: RIGHT breast mass 9:30 o'clock 3 cm from nipple (RIBBON clip) Lesion quadrant: Upper outer quadrant Using sterile technique and 1% Lidocaine  as local anesthetic,  under direct ultrasound visualization, a 14 gauge spring-loaded device was used to perform biopsy of a mass in the right breast 9:30 o'clock position 3 cm from  nipple using a lateral approach. At the conclusion of the procedure, a ribbon shaped tissue marker clip was deployed into the biopsy cavity. SITE 2: Right breast mass at 9 o'clock 6 cm from nipple (COIL clip) Lesion quadrant: Upper outer quadrant Using sterile technique and 1% Lidocaine  as local anesthetic, under direct ultrasound visualization, a 14 gauge spring-loaded device was used to perform biopsy of a mass in the right breast 9 o'clock position 6 cm from the nipple using a lateral approach. At the conclusion of the procedure, a coil shaped tissue marker clip was deployed into the biopsy cavity. SITE 3: Right axillary lymph node (HydroMARK coil clip) Lesion quadrant: Upper outer quadrant Using sterile technique and 1% Lidocaine  as local anesthetic, under direct ultrasound visualization, a 14 gauge spring-loaded device was used to perform biopsy of an enlarged right axillary lymph node using an inferior approach. At the conclusion of the procedure, HydroMARK coil tissue marker clip was deployed into the biopsy cavity. Follow up 2 view mammogram was performed and dictated separately. IMPRESSION: 1. Ultrasound guided biopsy of a mass in the RIGHT breast 9:30 o'clock position 3 cm from nipple (RIBBON clip). No apparent complications. 2. Ultrasound guided biopsy of a mass in the RIGHT breast 9:00 o'clock position 6 cm from nipple (COIL clip). No apparent complications. 3. Ultrasound guided biopsy of an enlarged RIGHT axillary lymph node (hydroMARK clip). No apparent complications. Electronically Signed: By: Dirk Arrant M.D. On: 10/16/2023 09:43   MM CLIP PLACEMENT RIGHT Result Date: 10/16/2023 CLINICAL DATA:  Status post ultrasound-guided biopsy of 2 representative suspicious masses in the RIGHT breast and an enlarged RIGHT axillary lymph node. EXAM:  3D DIAGNOSTIC RIGHT MAMMOGRAM POST ULTRASOUND BIOPSY COMPARISON:  Previous exam(s). ACR Breast Density Category b: There are scattered areas of fibroglandular density. FINDINGS: 3D Mammographic images were obtained following ultrasound guided biopsy of 2 masses in the right breast and a right axillary lymph node. The ribbon shaped biopsy marking clip is in expected position at the site of biopsy right breast 9:30 o'clock position 3 cm from the nipple. The coil shaped biopsy marking clip is in expected position at the site of biopsy right breast 9 o'clock position 6 cm from nipple. The HydroMARK coil biopsy marking clip is in expected position at the site of biopsy in the right axilla. IMPRESSION: Appropriate positioning of the RIBBON, COIL, and HydroMARK shaped biopsy marking clips at the sites of biopsy in the RIGHT breast and RIGHT axilla, as described above. Final Assessment: Post Procedure Mammograms for Marker Placement Electronically Signed   By: Dirk Arrant M.D.   On: 10/16/2023 09:48   MM 3D DIAGNOSTIC MAMMOGRAM BILATERAL BREAST Result Date: 10/13/2023 CLINICAL DATA:  Painful, palpable abnormality in the RIGHT axilla. EXAM: DIGITAL DIAGNOSTIC BILATERAL MAMMOGRAM WITH TOMOSYNTHESIS AND CAD; ULTRASOUND RIGHT BREAST COMPLETE TECHNIQUE: Bilateral digital diagnostic mammography and breast tomosynthesis was performed. The images were evaluated with computer-aided detection. ; Targeted ultrasound examination of the right axilla was performed. COMPARISON:  Previous exam(s). ACR Breast Density Category b: There are scattered areas of fibroglandular density. FINDINGS: Mammogram was initially deferred due to patient's pregnancy in the axillary location of the palpable abnormality. However, mammogram was ultimately performed after the findings of the RIGHT axilla ultrasound, as below. The examination was performed with appropriate abdominal shielding. There is no evidence of malignancy in the LEFT breast. In the  LATERAL RIGHT breast approximate 9 o'clock position there is a focal asymmetry versus multiple adjacent focal asymmetries spanning approximately 3.6 x 4.5  x 4.4 cm (M-L x A-P x S-I). There is vague associated distortion, particularly anteriorly. 4 mm oval circumscribed mass is also noted on the CC spot view (slice 25/60). An enlarged RIGHT axillary lymph node is partially visualized on the MLO view only. Targeted ultrasound of the RIGHT breast demonstrates multiple masses in the 9-930 region, including: 1. Irregular hypoechoic mass with circumscribed margins at 9 o'clock 6 cm from the nipple measuring 9 x 6 x 10 mm. 2. Immediately deep to mass 1, there is an oval, hypoechoic mass with indistinct margins measuring 5 x 2 x 4 mm, also at 9 o'clock 6 cm from the nipple. 3. At 9 o'clock, 9 cm from the nipple there is an oval, hypoechoic mass with a single angular margin measuring 4 x 3 x 5 mm. 4. Irregular, spiculated hypoechoic mass at the 9:30 position 3 cm from the nipple measuring 9 x 5 x 9 mm. 5. Superficial mass at 9 o'clock 5 cm from the nipple measuring 4 x 3 x 2 mm, likely correlating with the circumscribed mass seen on the CC view. Additionally, at least 3 markedly enlarged RIGHT axillary lymph nodes are seen, the largest with a cortical thickness of 13 mm and measuring up to 4.6 cm in maximum diameter. Ultrasound of the LEFT axilla was performed for comparison sake, with only normal lymph nodes identified. IMPRESSION: 1. RIGHT breast focal asymmetries with associated vague architectural distortion, which correlate with 5 masses of varying sizes on ultrasound, including a BI-RADS 5 spiculated mass measuring 9 mm at 9:30, and a BI-RADS 4 irregular mass at 9 o'clock 6 cm from the nipple measuring 10 mm. Additional smaller masses as above. Ultrasound-guided biopsy of the 2 largest masses is recommended. 2. Marked RIGHT axillary lymphadenopathy, worrisome for possible nodal metastases. Ultrasound-guided biopsy the  largest node is recommended. 3.  No mammographic evidence of malignancy in the LEFT breast. RECOMMENDATION: Ultrasound-guided biopsy the RIGHT breast/axilla x3 (mass at 9:30 3 cmfn; larger mass at 9:00 6 cmfn; and largest right axillary lymph node). The remaining RIGHT breast masses remain suspicious for malignancy. Recommend further management of these masses be based on the above biopsy results and anticipated surgical planning. I have discussed the findings and recommendations with the patient. If applicable, a reminder letter will be sent to the patient regarding the next appointment. BI-RADS CATEGORY  5: Highly suggestive of malignancy. Electronically Signed   By: Norleen Croak M.D.   On: 10/13/2023 13:43   US  BREAST COMPLETE UNI RIGHT INC AXILLA Result Date: 10/13/2023 CLINICAL DATA:  Painful, palpable abnormality in the RIGHT axilla. EXAM: DIGITAL DIAGNOSTIC BILATERAL MAMMOGRAM WITH TOMOSYNTHESIS AND CAD; ULTRASOUND RIGHT BREAST COMPLETE TECHNIQUE: Bilateral digital diagnostic mammography and breast tomosynthesis was performed. The images were evaluated with computer-aided detection. ; Targeted ultrasound examination of the right axilla was performed. COMPARISON:  Previous exam(s). ACR Breast Density Category b: There are scattered areas of fibroglandular density. FINDINGS: Mammogram was initially deferred due to patient's pregnancy in the axillary location of the palpable abnormality. However, mammogram was ultimately performed after the findings of the RIGHT axilla ultrasound, as below. The examination was performed with appropriate abdominal shielding. There is no evidence of malignancy in the LEFT breast. In the LATERAL RIGHT breast approximate 9 o'clock position there is a focal asymmetry versus multiple adjacent focal asymmetries spanning approximately 3.6 x 4.5 x 4.4 cm (M-L x A-P x S-I). There is vague associated distortion, particularly anteriorly. 4 mm oval circumscribed mass is also noted on the  CC spot view (slice 25/60). An enlarged RIGHT axillary lymph node is partially visualized on the MLO view only. Targeted ultrasound of the RIGHT breast demonstrates multiple masses in the 9-930 region, including: 1. Irregular hypoechoic mass with circumscribed margins at 9 o'clock 6 cm from the nipple measuring 9 x 6 x 10 mm. 2. Immediately deep to mass 1, there is an oval, hypoechoic mass with indistinct margins measuring 5 x 2 x 4 mm, also at 9 o'clock 6 cm from the nipple. 3. At 9 o'clock, 9 cm from the nipple there is an oval, hypoechoic mass with a single angular margin measuring 4 x 3 x 5 mm. 4. Irregular, spiculated hypoechoic mass at the 9:30 position 3 cm from the nipple measuring 9 x 5 x 9 mm. 5. Superficial mass at 9 o'clock 5 cm from the nipple measuring 4 x 3 x 2 mm, likely correlating with the circumscribed mass seen on the CC view. Additionally, at least 3 markedly enlarged RIGHT axillary lymph nodes are seen, the largest with a cortical thickness of 13 mm and measuring up to 4.6 cm in maximum diameter. Ultrasound of the LEFT axilla was performed for comparison sake, with only normal lymph nodes identified. IMPRESSION: 1. RIGHT breast focal asymmetries with associated vague architectural distortion, which correlate with 5 masses of varying sizes on ultrasound, including a BI-RADS 5 spiculated mass measuring 9 mm at 9:30, and a BI-RADS 4 irregular mass at 9 o'clock 6 cm from the nipple measuring 10 mm. Additional smaller masses as above. Ultrasound-guided biopsy of the 2 largest masses is recommended. 2. Marked RIGHT axillary lymphadenopathy, worrisome for possible nodal metastases. Ultrasound-guided biopsy the largest node is recommended. 3.  No mammographic evidence of malignancy in the LEFT breast. RECOMMENDATION: Ultrasound-guided biopsy the RIGHT breast/axilla x3 (mass at 9:30 3 cmfn; larger mass at 9:00 6 cmfn; and largest right axillary lymph node). The remaining RIGHT breast masses remain  suspicious for malignancy. Recommend further management of these masses be based on the above biopsy results and anticipated surgical planning. I have discussed the findings and recommendations with the patient. If applicable, a reminder letter will be sent to the patient regarding the next appointment. BI-RADS CATEGORY  5: Highly suggestive of malignancy. Electronically Signed   By: Norleen Croak M.D.   On: 10/13/2023 13:43   US  RT UPPER EXTREM LTD SOFT TISSUE NON VASCULAR Result Date: 10/02/2023 CLINICAL DATA:  Palpable abnormality of the right axilla. EXAM: ULTRASOUND RIGHT UPPER EXTREMITY LIMITED TECHNIQUE: Ultrasound examination of the upper extremity soft tissues was performed in the area of clinical concern. COMPARISON:  None Available. FINDINGS: Soft tissue ultrasound in the right axillary region demonstrates a hypoechoic structure measuring roughly 4.0 x 1.8 x 2.9 cm. This is difficult to fully characterized by ultrasound given depth but may represent an enlarged lymph node given shape and appearance. IMPRESSION: 4.0 x 1.8 x 2.9 cm hypoechoic structure in the right axillary region may represent an enlarged lymph node given shape and appearance. This is difficult to fully characterized by ultrasound given depth but may represent an enlarged lymph node. Further follow-up options include CT of the chest and also workup to exclude right breast pathology, starting with breast exam and consideration of possible mammography. Electronically Signed   By: Marcey Moan M.D.   On: 10/02/2023 16:35    ASSESSMENT & PLAN:   Malignant neoplasm of upper-outer quadrant of right breast in female, estrogen receptor positive (HCC) # JULY 2025-[right arm discomfort palpable] ultrasound/mammogram diagnostic -  multifocal-right breast cancer  [spanning approximately 3.6 x 4.5 x 4.4 cm]. Markedly enlarged RIGHT axillary lymph nodes are seen, the largest with a cortical thickness of 13 mm and measuring up to 4.6 cm in  maximum diameter.   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  # Clinically stage II vs III cancer- I reviewed the pathology and stage of cancers in detail with the patient.  However recommend a CT scan for further evaluation-given the bulky axillary adenopathy.  I had a long discussion with the patient in general regarding the treatment options of breast cancer including-surgery; adjuvant radiation; role of adjuvant systemic therapy including-chemotherapy antihormone therapy.    #Given positive clinical lymph nodes-I would recommend neoadjuvant chemotherapy-followed by surgery.  However the first trimester pregnancy complicates the treatment planning.   # Chronic this patient specific situation-I would recommend surgery first followed by adjuvant chemotherapy.  I have reached out to surgery Dr. Lane regarding his thoughts.  Given the complicated medical/and social nature-recommend second opinion-[facility] or [facility].    # First trimester pregnancy/8 weeks as per patient.-However no obstetric care as per patient. During interview patient wanted to keep the pregnancy news discrete from her husband.  Patient did not want her husband to aware of the pregnancy.  Obviously-this has a significantly patient the whole treatment plan. Recommend urgent ob evaluation.   # Genetic counseling: As per patient multiple family members under father side with cancers.  And also mother with breast cancer currently going to therapy.  She will need genetic testing/referral.  # History of bipolar disorder-not any treatment; History of drug abuse-obviously complicates care at this time.  Thank you Dr.MIles  for allowing me to participate in the care of your pleasant patient. Please do not hesitate to contact me with questions or concerns in the interim.  # DISPOSITION: # no labs-  #  [facility]- second opinion breast cancer  # follow up TBD- Dr.B  Dr.Miles-   All questions were answered. The patient/family knows to call the clinic with any problems, questions or concerns.    Cindy JONELLE Joe, MD 10/22/2023 4:02 PM

## 2023-10-22 NOTE — Progress Notes (Signed)
 Accompanied patient and family to initial medical oncology appointment.   Reviewed Breast Cancer treatment handbook.   Care plan summary given to patient.   Reviewed outreach programs and cancer center services. Referral sent to [facility] for 2nd opinion.

## 2023-10-22 NOTE — Progress Notes (Signed)
 Pt states she has had a nodule on her breast for a good while. Appetite is good. Feels tired. Has a lot of heartburn. Has long hx of constipation. States right breast is tender. Denies other pain. Although I asked why she is taking oxycodone  for her ankle pain. States she has 1 child at home. She states she has other children.

## 2023-10-23 ENCOUNTER — Inpatient Hospital Stay

## 2023-10-23 NOTE — Progress Notes (Signed)
 [facility] CSW Progress Note  Visual merchandiser received a call from patient inquiring about free massages.     Interventions: Provided patient with information about the process for obtaining free massages through the [employer].  Mailed the information packet to patient.  She stated she understood.       Follow Up Plan:  Patient will contact CSW with any support or resource needs    Macario CHRISTELLA Au, LCSW Clinical Social Worker [facility]    Patient is participating in a Managed Medicaid Plan:  Yes

## 2023-10-28 ENCOUNTER — Ambulatory Visit: Admitting: Internal Medicine

## 2023-10-28 ENCOUNTER — Telehealth: Payer: Self-pay | Admitting: Podiatry

## 2023-10-28 NOTE — Telephone Encounter (Signed)
 Patient called requesting a refill for oxyCODONE -acetaminophen  (Percocet) 5-325 mg tablets.  Chart shows a refill was sent on 10/15/23, which the patient confirmed she has already picked up.  She was advised to contact her pharmacy to have them submit a refill request. The patient stated that the pharmacy will not request a refill for an opioid medication. She was informed that pharmacies do typically send these requests.  She expressed frustration and asked since when the policy changed to require patients to call the pharmacy directly. She was told this has been in place for at least a week and that it should be mentioned in the phone tree when calling the main line.

## 2023-10-29 ENCOUNTER — Telehealth: Payer: Self-pay | Admitting: Podiatry

## 2023-10-29 NOTE — Telephone Encounter (Signed)
 Patient is calling in today, stating her refill is not filled at pharmacy. Patient was advised, Dr. Tobie will check messages, he is currently with patients and will respond.

## 2023-10-30 ENCOUNTER — Ambulatory Visit: Admitting: Surgery

## 2023-10-31 ENCOUNTER — Telehealth: Payer: Self-pay | Admitting: Podiatry

## 2023-10-31 NOTE — Telephone Encounter (Signed)
 Patient called requesting a refill for:oxyCODONE -acetaminophen  (PERCOCET) 5-325 MG tablet.  She was advised to contact her pharmacy to have them submit a refill request and to do so and to contact them for future medication refills to initiate the request.  Passed along message that Dr.Patel would not be sending in prescription until at least next week.

## 2023-11-03 ENCOUNTER — Telehealth: Payer: Self-pay | Admitting: Podiatry

## 2023-11-03 NOTE — Telephone Encounter (Signed)
Patient needs med refill--

## 2023-11-04 ENCOUNTER — Telehealth: Payer: Self-pay | Admitting: Podiatry

## 2023-11-04 MED ORDER — OXYCODONE-ACETAMINOPHEN 5-325 MG PO TABS
1.0000 | ORAL_TABLET | ORAL | 0 refills | Status: AC | PRN
Start: 2023-11-04 — End: ?

## 2023-11-04 NOTE — Telephone Encounter (Signed)
 Patient notified

## 2023-11-04 NOTE — Telephone Encounter (Signed)
 Left detailed message regarding refill request refill due in 4 weeks .

## 2023-11-04 NOTE — Telephone Encounter (Signed)
Patient called requesting pain medication refill.

## 2023-11-08 LAB — GLUCOSE, POCT (MANUAL RESULT ENTRY): POC Glucose: 117 mg/dL — AB (ref 70–99)

## 2023-11-10 NOTE — Congregational Nurse Program (Signed)
  Dept: [phone]   Congregational Nurse Program Note  Date of Encounter: 11/08/2023   Provided know your numbers handout.   Past Medical History: Past Medical History:  Diagnosis Date   Anxiety    NO MEDS   Arthritis of ankle    Breast mass    Breast tenderness in female    3;00 x3 weeks   Chronic pain syndrome    Depression    NO MEDS   GERD (gastroesophageal reflux disease)    OCC-NO MEDS   Headache    HAS RECENTLY STARTED HAVING HA'S MORE FREQUENTLY   Obesity    OSA (obstructive sleep apnea)     Encounter Details:  Community Questionnaire - 11/08/23 1700       Questionnaire   Ask client: Do you give verbal consent for me to treat you today? Yes    Student Assistance N/A    Location Patient Served  N/A    Encounter Setting Other   [employer] Event   Population Status Unknown    Insurance Medicaid    Insurance/Financial Assistance Referral N/A    Medication N/A    Medical Provider Yes    Screening Referrals Made N/A    Medical Referrals Made N/A    Medical Appointment Completed N/A    CNP Interventions Advocate/Support;Educate    Screenings CN Performed Blood Glucose    ED Visit Averted N/A    Life-Saving Intervention Made N/A          There were no vitals filed for this visit. There is no height or weight on file to calculate BMI.

## 2023-12-05 ENCOUNTER — Encounter: Payer: Self-pay | Admitting: *Deleted

## 2023-12-08 ENCOUNTER — Telehealth: Payer: Self-pay | Admitting: Podiatry

## 2023-12-08 NOTE — Telephone Encounter (Signed)
 Patient called asking for refill for pain medication. Patient states she is in a lot of pain.

## 2023-12-08 NOTE — Telephone Encounter (Signed)
 Patient called in to request a refill of oxycodone .

## 2023-12-09 NOTE — Telephone Encounter (Signed)
 Spoke to patient advised to schedule appointment to be evaluated in office for future pain medication and healing process.

## 2023-12-09 NOTE — Telephone Encounter (Signed)
 Patient called back concerning prescription refill. Relayed message from provider. She's requesting a call from nurse.

## 2023-12-19 ENCOUNTER — Ambulatory Visit: Admitting: Podiatry

## 2023-12-22 ENCOUNTER — Encounter: Payer: Self-pay | Admitting: Podiatry

## 2023-12-22 ENCOUNTER — Telehealth: Payer: Self-pay | Admitting: Podiatry

## 2023-12-22 NOTE — Telephone Encounter (Signed)
 Went through refill request with patient and communications patient states needs medication refill and stated will schedule earlier appointment to get refill because I can't speak for the doctor and he will be refilling her medication.

## 2023-12-22 NOTE — Telephone Encounter (Signed)
Patient needs refill of pain medication

## 2023-12-22 NOTE — Telephone Encounter (Signed)
 Patient wanted RX refill pain meds, I see Dr Tobie response patient to see Dr Magdalen RADDLE. Dr Magdalen RADDLE next available in [facility] is 10/22. Patient states she is in a lot of pain and she doesn't know if she can wait until then.

## 2023-12-23 ENCOUNTER — Other Ambulatory Visit: Payer: Self-pay | Admitting: Podiatry

## 2023-12-23 MED ORDER — OXYCODONE-ACETAMINOPHEN 5-325 MG PO TABS: 1.0000 | ORAL_TABLET | ORAL | 0 refills | Status: AC | PRN

## 2023-12-23 NOTE — Telephone Encounter (Signed)
 Called pt let her know per Dr Tobie he sent some medication over for pain until her follow up with Dr Magdalen.

## 2023-12-23 NOTE — Telephone Encounter (Signed)
 Provider has refilled medication already.

## 2023-12-30 ENCOUNTER — Ambulatory Visit: Admitting: Podiatry

## 2024-01-14 ENCOUNTER — Ambulatory Visit

## 2024-01-21 NOTE — Progress Notes (Signed)
 Returned call to patient who had questions about her appointments for tomorrow. Let pt know that she does need to come to her appt's-went over time for lab, clinic and inf appt and pt confirmed she would be there. Asked pt why she cancelled and rescheduled her previously scheduled port and liver biopsy procedure on 10/27-pt expressed that she is not fond of the idea of getting a port. Explained to patient that she will unfortunately need the port because a portion of her chemotherapy treatment requires it to be given via a central line and can not be given through a PIV. Pt expressed she understood that. Let pt know that we really did need the liver biopsy done as it is crucial to have those answers to best treat her cancer. Pt said that she rescheduled it to 11/18. Let pt know that since she does not have the port tomorrow she will continue to receive the paclitaxol that she has been getting. Pt understood and was appreciative of the call.

## 2024-01-28 ENCOUNTER — Ambulatory Visit (INDEPENDENT_AMBULATORY_CARE_PROVIDER_SITE_OTHER)

## 2024-01-28 ENCOUNTER — Ambulatory Visit

## 2024-01-28 DIAGNOSIS — G894 Chronic pain syndrome: Secondary | ICD-10-CM | POA: Diagnosis not present

## 2024-01-28 DIAGNOSIS — M19071 Primary osteoarthritis, right ankle and foot: Secondary | ICD-10-CM | POA: Diagnosis not present

## 2024-01-28 DIAGNOSIS — M25571 Pain in right ankle and joints of right foot: Secondary | ICD-10-CM

## 2024-01-28 DIAGNOSIS — M79671 Pain in right foot: Secondary | ICD-10-CM

## 2024-01-28 NOTE — Progress Notes (Addendum)
 Subjective:  Patient ID: Madison Singleton, female    DOB: Apr 15, 1990,  MRN: [id number]  Chief Complaint  Patient presents with   Ankle Pain    referred by Dr Tobie for surgey consult. She states she has had 4 surgeries on the right ankle. She reports some swelling  and pain .    Discussed the use of AI scribe software for clinical note transcription with the patient, who gave verbal consent to proceed.  History of Present Illness Madison Singleton is a 33 year old female with ankle arthritis who presents for evaluation of chronic ankle pain. She was referred by Dr. Tobie for evaluation of her ankle arthritis.  She experiences chronic ankle pain primarily during weight-bearing activities, which worsens with prolonged standing or activity, leading to swelling and increased discomfort. Occasionally, she experiences aching even when not weight-bearing. Her ankle arthritis developed following a previous ankle fracture. She has had multiple ankle arthroscopy procedures as well as injections. She has not tried a lace-up ankle brace. She is currently pregnant as well as undergoing treatment for breast cancer at [facility]. [facility] care is managing her pain medication. She is concerned about the impact of her ankle pain on her ability to work, as it limits her capacity to stand or walk for extended periods.     Review of Systems: Negative except as noted in the HPI. Denies N/V/F/Ch.  Past Medical History:  Diagnosis Date   Anxiety    NO MEDS   Arthritis of ankle    Breast mass    Breast tenderness in female    3;00 x3 weeks   Chronic pain syndrome    Depression    NO MEDS   GERD (gastroesophageal reflux disease)    OCC-NO MEDS   Headache    HAS RECENTLY STARTED HAVING HA'S MORE FREQUENTLY   Obesity    OSA (obstructive sleep apnea)     Current Outpatient Medications:    esomeprazole (NEXIUM) 40 MG capsule, Take 40 mg by mouth daily., Disp: , Rfl:    ibuprofen  (ADVIL ) 800 MG  tablet, Take 1 tablet (800 mg total) by mouth every 6 (six) hours as needed., Disp: 60 tablet, Rfl: 1   ondansetron  (ZOFRAN ) 8 MG tablet, One pill every 8 hours as needed for nausea/vomitting., Disp: 40 tablet, Rfl: 1   oxyCODONE -acetaminophen  (PERCOCET) 5-325 MG tablet, Take 1 tablet by mouth every 4 (four) hours as needed for severe pain (pain score 7-10)., Disp: 30 tablet, Rfl: 0   oxyCODONE -acetaminophen  (PERCOCET) 5-325 MG tablet, Take 1 tablet by mouth every 4 (four) hours as needed for severe pain (pain score 7-10)., Disp: 30 tablet, Rfl: 0   oxyCODONE -acetaminophen  (PERCOCET) 5-325 MG tablet, Take 1 tablet by mouth every 4 (four) hours as needed for severe pain (pain score 7-10)., Disp: 30 tablet, Rfl: 0   oxyCODONE -acetaminophen  (PERCOCET) 5-325 MG tablet, Take 1 tablet by mouth every 4 (four) hours as needed for severe pain (pain score 7-10)., Disp: 30 tablet, Rfl: 0   oxyCODONE -acetaminophen  (PERCOCET) 5-325 MG tablet, Take 1 tablet by mouth every 4 (four) hours as needed for severe pain (pain score 7-10)., Disp: 30 tablet, Rfl: 0   oxyCODONE -acetaminophen  (PERCOCET) 5-325 MG tablet, Take 1 tablet by mouth every 4 (four) hours as needed for severe pain (pain score 7-10)., Disp: 30 tablet, Rfl: 0  Social History   Tobacco Use  Smoking Status Every Day   Current packs/day: 0.50   Types: Cigarettes   Passive exposure: Past  Smokeless  Tobacco Never    Allergies  Allergen Reactions   Haldol [Haloperidol Lactate]     Throat swelling   Amoxicillin Rash    Did it involve swelling of the face/tongue/throat, SOB, or low BP? Unknown Did it involve sudden or severe rash/hives, skin peeling, or any reaction on the inside of your mouth or nose? Yes Did you need to seek medical attention at a hospital or doctor's office? Yes When did it last happen? Childhood reaction. If all above answers are NO, may proceed with cephalosporin use.    Objective:   Constitutional Well developed. Well  nourished. Oriented to person, place, and time.  Vascular Dorsalis pedis pulses palpable bilaterally. Posterior tibial pulses palpable bilaterally. Capillary refill normal to all digits.  No cyanosis or clubbing noted. Pedal hair growth normal.  Neurologic Normal speech. Epicritic sensation to light touch grossly intact bilaterally. Negative tinel sign at tarsal tunnel bilaterally.   Dermatologic Skin texture and turgor are within normal limits.  No open wounds. No skin lesions.  Musculoskeletal: Range of motion is mildly restricted and decreased to the Right ankle with without pain or crepitus.  Ankle DF -10 with knee extended and able to get past neutral with knee flexed, indicating Gastrocnemius Equinus. Muscle strength is graded at 5/5 for all dorsiflexors, plantar flexors, invertors, and evertors bilaterally. There is pain with peri-articular palpation of the Right ankle, most focused anterior. There is no pain with palpation about the subtalar and talonavicular joints. Rectus footshape present with adequate ROM through hindfoot.    Radiographs: Taken and reviewed of the right foot and ankle.  These do show arthritic changes to the tibiotalar joint with joint space narrowing, subchondral sclerosis.  Evidence of previous hardware removal from ankle ORIF.  Mortise generally well-maintained, there is ossification in the syndesmosis. Rectus ankle without deformity.  Hindfoot joints are well-maintained without significant arthropathy.  Rectus foot shape with normal talonavicular coverage and hindfoot alignment.     Assessment:   1. Foot pain, right   2. Acute right ankle pain   3. Arthritis of right ankle   4. Chronic pain syndrome      Plan:  Patient was evaluated and treated and all questions answered.  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 Pain and swelling with weight-bearing.   -Discussed the option for injection because it has been [facility] times that she received one.  She would like to hold off on this for now. - Discussed long-term treatment goals and possible surgical interventions that will be required.  I discussed that at this time she is too young for an ankle replacement due to likely need for revision surgery over time.  We discussed that I would recommend an ankle arthrodesis with possible takedown in the future if needed. - Patient states that she does feel better when her ankle does not move is much.  We discussed different treatment options for the short-term while she is pregnant and in treatment for breast cancer.  Today, she was provided a Tri-Lock ankle brace to decrease ankle motion. - She is going to return to clinic for likely surgical planning once her other health conditions are stable and she is no longer pregnant.    Prentice Ovens, DPM AACFAS Fellowship Trained Podiatric Surgeon [facility]

## 2024-01-30 ENCOUNTER — Telehealth: Payer: Self-pay

## 2024-01-30 NOTE — Telephone Encounter (Signed)
 Patient called in requesting pain medication.

## 2024-02-02 NOTE — Telephone Encounter (Signed)
Spoke to patient informed
